# Patient Record
Sex: Female | Born: 1975
Health system: Southern US, Community
[De-identification: ages and names within clinical notes are randomized; demographics above are authoritative.]

## PROBLEM LIST (undated history)

## (undated) DIAGNOSIS — C569 Malignant neoplasm of unspecified ovary: Secondary | ICD-10-CM

## (undated) DIAGNOSIS — R1900 Intra-abdominal and pelvic swelling, mass and lump, unspecified site: Secondary | ICD-10-CM

## (undated) DIAGNOSIS — B379 Candidiasis, unspecified: Secondary | ICD-10-CM

## (undated) DIAGNOSIS — IMO0002 Reserved for concepts with insufficient information to code with codable children: Secondary | ICD-10-CM

## (undated) DIAGNOSIS — M5432 Sciatica, left side: Secondary | ICD-10-CM

## (undated) DIAGNOSIS — R1902 Left upper quadrant abdominal swelling, mass and lump: Secondary | ICD-10-CM

## (undated) DIAGNOSIS — C561 Malignant neoplasm of right ovary: Secondary | ICD-10-CM

## (undated) DIAGNOSIS — G479 Sleep disorder, unspecified: Secondary | ICD-10-CM

## (undated) DIAGNOSIS — R87619 Unspecified abnormal cytological findings in specimens from cervix uteri: Secondary | ICD-10-CM

## (undated) DIAGNOSIS — N83209 Unspecified ovarian cyst, unspecified side: Secondary | ICD-10-CM

## (undated) DIAGNOSIS — C801 Malignant (primary) neoplasm, unspecified: Secondary | ICD-10-CM

## (undated) DIAGNOSIS — D649 Anemia, unspecified: Secondary | ICD-10-CM

## (undated) DIAGNOSIS — A630 Anogenital (venereal) warts: Secondary | ICD-10-CM

## (undated) DIAGNOSIS — Z9889 Other specified postprocedural states: Secondary | ICD-10-CM

## (undated) DIAGNOSIS — L709 Acne, unspecified: Secondary | ICD-10-CM

## (undated) DIAGNOSIS — E210 Primary hyperparathyroidism: Secondary | ICD-10-CM

## (undated) DIAGNOSIS — E7800 Pure hypercholesterolemia, unspecified: Secondary | ICD-10-CM

## (undated) DIAGNOSIS — R232 Flushing: Secondary | ICD-10-CM

## (undated) DIAGNOSIS — Z8543 Personal history of malignant neoplasm of ovary: Secondary | ICD-10-CM

## (undated) DIAGNOSIS — F418 Other specified anxiety disorders: Secondary | ICD-10-CM

## (undated) DIAGNOSIS — K5900 Constipation, unspecified: Secondary | ICD-10-CM

## (undated) HISTORY — DX: Acne, unspecified: L70.9

## (undated) HISTORY — DX: Unspecified abnormal cytological findings in specimens from cervix uteri: R87.619

## (undated) HISTORY — DX: Pure hypercholesterolemia, unspecified: E78.00

## (undated) HISTORY — DX: Anogenital (venereal) warts: A63.0

## (undated) HISTORY — DX: Constipation, unspecified: K59.00

## (undated) HISTORY — DX: Personal history of malignant neoplasm of ovary: Z85.43

## (undated) HISTORY — DX: Other specified anxiety disorders: F41.8

## (undated) HISTORY — PX: BREAST BIOPSY: SHX20

## (undated) HISTORY — DX: Unspecified ovarian cyst, unspecified side: N83.209

## (undated) HISTORY — DX: Primary hyperparathyroidism: E21.0

## (undated) HISTORY — PX: LAPAROSCOPIC HYSTERECTOMY: SHX1926

## (undated) HISTORY — DX: Anemia, unspecified: D64.9

## (undated) HISTORY — DX: Flushing: R23.2

## (undated) HISTORY — PX: EYE SURGERY: SHX253

## (undated) HISTORY — PX: SMALL INTESTINE SURGERY: SHX150

## (undated) HISTORY — PX: LAPAROTOMY: SHX154

## (undated) HISTORY — PX: ABDOMINAL HYSTERECTOMY: SHX81

---

## 2009-06-29 NOTE — H&P (Unsigned)
[facility]   [address]   [place], [place] [address]     PRE-ADMISSION   HISTORY AND PHYSICAL    PATIENT: Katie Mckay, Katie Mckay  MRN: [id number] DATE: 06/30/2009  BILLING: [id number] ROOM:  DICTATING: Kenika Sahm Y. Mehran Guderian, MD,FACS      HISTORY OF PRESENT ILLNESS: This is a 33 year old, African American female  with a family history of breast cancer who presents today for excisional  biopsy of a palpable right breast mass. This mass has been present for  several months in duration. She presents today for excisional biopsy for  definitive diagnosis.    PAST MEDICAL HISTORY: None.    PAST SURGICAL HISTORY: Cesarean section, ovarian cystectomy, appendectomy,  cecectomy.    MEDICATIONS: Include prenatal vitamins.    ALLERGIES: NO KNOWN DRUG ALLERGIES.    SOCIAL HISTORY: Negative for alcohol and tobacco.    FAMILY HISTORY: Significant for breast cancer in a grandmother.    PHYSICAL EXAMINATION  GENERAL: The patient is a well-developed, well-nourished female in no  apparent distress.  HEENT EXAM: Within normal limits.  LUNGS: Clear to auscultation.  HEART: Regular rate.  ABDOMEN: Soft, nontender, nondistended. Active bowel sounds.  EXTREMITIES: Warm and nontender.  NEUROLOGICAL: There were no focal deficits.  BREAST: The right breast exam is significant for a 1.5 cm mass at the 6:30  position, 4 cm from the nipple. The left axillary exam is negative.    ASSESSMENT AND PLAN: This is a 33 year old, African American female with a  family history of breast cancer who presents today for excisional biopsy of  a palpable right breast mass. The procedure risks, benefits, alternatives  were explained to the patient who understands and wishes to proceed. All  questions were answered.                         Preliminary Unless Authenticated   Brynley Cuddeback Y. Corrina Steffensen, MD,FACS    EYC:wmx  D: 06/29/2009 T: 06/29/2009 9:45 A  Job: [id number] CScriptDoc #: [id number]    cc: Syliva Overman. Jovee Dettinger, MD,FACS

## 2009-06-30 NOTE — Op Note (Unsigned)
[facility]   [address] [place], [place] [address]     OPERATIVE REPORT    PATIENT: Katie Mckay, Katie Mckay  MRN: [id number] DATE: 06/30/2009  BILLING: [id number]  ROOM:  ATTENDING: Syliva Overman. Shoshannah Faubert, MD,FACS  DICTATING: Syliva Overman. Marshawn Normoyle, MD,FACS      PREOPERATIVE DIAGNOSIS: Right breast mass.    POSTOPERATIVE DIAGNOSIS: Right breast mass.    PROCEDURE: Right breast biopsy.    ANESTHESIA: Monitored anesthesia care.    SURGEON: Jacquelyne Balint, MD, FACS    PROCEDURE: The patient was brought into the operating room and was put in  the supine position. After adequate sedation, the patient was then prepped  and draped in usual sterile fashion. The area of the palpable mass was then  anesthetized with 1% lidocaine solution with bicarbonate. Once this was  done, a curvilinear incision was made include directly over this mass. The  subcutaneous tissue was divided with electrocautery. Superior and inferior  flaps were then elevated. The mass was then grasped with an Allis clamp and  then excised with electrocautery. After adequate irrigation and hemostasis,  the incision was closed in layers. The subcutaneous tissue was closed with  interrupted 3-0 Vicryl sutures, and the skin was then closed with a 4-0  Vicryl subcuticular stitch. Dry sterile dressings were placed. The patient  was then transported to the recovery room in stable condition.             Preliminary Unless Authenticated   Janari Yamada Y. Emmajo Bennette, MD,FACS    EYC:wmx  D: 06/30/2009 T: 06/30/2009 12:19 P  Job: [id number] CScriptDoc #: [id number]    cc: Syliva Overman. Makynzie Dobesh, MD,FACS

## 2011-07-04 MED ORDER — CLONAZEPAM 0.5 MG TAB
0.5 mg | ORAL_TABLET | Freq: Every evening | ORAL | Status: DC | PRN
Start: 2011-07-04 — End: 2011-10-30

## 2011-07-04 NOTE — Progress Notes (Signed)
Last pap 2011. Abnormal Pap smears Yes (2008).  Procedures None.Form of contraception Yes - mirena. Mammogram 2010. Family history of breast CA Yes (pgm 60's), colon CA No, cervical CA No.Tetanus Yes - not over 10 yrs.  Patient is scheduled for pap with Dr. Lanny Hurst.

## 2011-07-04 NOTE — Patient Instructions (Signed)
Learning About Stress  What is stress?  Stress is what you feel when you have to handle more than you are used to. Stress is a fact of life for most people, and it affects everyone differently. What causes stress for you may not be stressful for someone else.  A lot of things can cause stress. You may feel stress when you go on a job interview, take a test, or run a race. This kind of short-term stress is normal and even useful. It can help you if you need to work hard or react quickly. For example, stress can help you finish an important job on time.  Stress also can last a long time. Long-term stress is caused by stressful situations or events. Examples of long-term stress include long-term health problems, ongoing problems at work, or conflicts in your family. Long-term stress can harm your health.  How does stress affect your health?  When you are stressed, your body responds as though you are in danger. It makes hormones that speed up your heart, make you breathe faster, and give you a burst of energy. This is called the fight-or-flight stress response. If the stress is over quickly, your body goes back to normal and no harm is done.  But if stress happens too often or lasts too long, it can have bad effects. Long-term stress can make you more likely to get sick, and it can make symptoms of some diseases worse. If you tense up when you are stressed, you may develop neck, shoulder, or low back pain. Stress is linked to high blood pressure and heart disease.  Stress also harms your emotional health. It can make you moody, tense, or depressed. Your relationships may suffer, and you may not do well at work or school.  What can you do to manage stress?  How to relax your mind  ?? Write. It may help to write about things that are bothering you. This helps you find out how much stress you feel and what is causing it. When you know this, you can find better ways to cope.    ?? Let your feelings out. Talk, laugh, cry, and express anger when you need to. Talking with friends, family, a Veterinary surgeon, or a member of the clergy about your feelings is a healthy way to relieve stress.   ?? Do something you enjoy. For example, listen to music or go to a movie. Practice your hobby or do volunteer work.   ?? Meditate. This can help you relax, because you are not worrying about what happened before or what may happen in the future.   ?? Do guided imagery. Imagine yourself in any setting that helps you feel calm. You can use audiotapes, books, or a teacher to guide you.   How to relax your body  ?? Do something active. Exercise or activity can help reduce stress. Walking is a great way to get started. Even everyday activities such as housecleaning or yard work can help.   ?? Do breathing exercises. For example:   ?? From a standing position, bend forward from the waist with your knees slightly bent. Let your arms dangle close to the floor.   ?? Breathe in slowly and deeply as you return to a standing position. Roll up slowly and lift your head last.   ?? Hold your breath for just a few seconds in the standing position.   ?? Breathe out slowly and bend forward from the waist.   ??  Try yoga or tai chi. These techniques combine exercise and meditation. You may need some training at first to learn them.   What can you do to prevent stress?  ?? Manage your time. This helps you find time to do the things you want and need to do.   ?? Get enough sleep. Your body recovers from the stresses of the day while you are sleeping.   ?? Get support. Your family, friends, and community can make a difference in how you experience stress.     Where can you learn more?    Go to [employer]   Enter ([phone] in the search box to learn more about "Learning About Stress."     ?? 2006-2012 [employer]. Care instructions adapted under license by [employer] (which disclaims liability or warranty for this information). This care instruction is for use with your licensed healthcare professional. If you have questions about a medical condition or this instruction, always ask your healthcare professional. [employer]wise, Incorporated disclaims any warranty or liability for your use of this information.  Content Version: 9.4.94723; Last Revised: July 08, 2010          Work-Life Balance: After Your Visit  Your Care Instructions  Do you ever feel like there is not enough time to do all of the things you have to do, and no time at all for the things you enjoy? If so, you are not alone.  On average, people in the [place] have worked more and more hours each year since 1970. But in recent years, fewer people say they want to take on more at work, even if they would get promoted or get paid more money. More and more workers say they want time to spend with their families and to do things that are important to them.  Do you ever feel:  ?? That you always have more and more work to do at your job?   ?? That too many people depend on you every day?   ?? That you never have enough time for your family or friends?   ?? That you never have time for hobbies or things you enjoy?   ?? That each second of your day is scheduled?   If you answered "yes" to any of these questions, take steps at work and at home to get your life into balance.  Follow-up care is a key part of your treatment and safety. Be sure to make and go to all appointments, and call your doctor if you are having problems.  How can you care for yourself at home?  Manage your time  ?? Focus on the important things. Taking on too much can wear you out. Look at how you spend your time, and redirect your focus. Learn to say "no" and let go of things that do not matter.    ?? Set one small goal at a time. Use a day planner. Break large projects into smaller ones.   ?? Ask for help. Let your children, your spouse, your coworkers, and other people in your life help you get things done.   ?? Leave your job at the office. If you give up free time to get more work done, you may pay for it with stress. If your job offers a flexible work schedule, use it to fit your own work style. For instance, come in earlier to have a longer lunch break, or make time for a yoga class or workout during your workday.   ??  Unplug. Do not let technology (such as your cell phone or the Internet) erase the line between your time and your employer's time.   Lower job stress  Job stress causes trouble at work and at home. At work, you may worry about things you have not had time to do at home. At home, you may worry about your job. This cycle upsets your work-life balance. Lowering your job stress can get your life back in balance.  Job stress can be caused by:  ?? Pressure and deadlines.   ?? Heavy workloads or long hours.   ?? Not being allowed to make decisions.   ?? Health and safety hazards.   ?? Feeling you may lose your job.   ?? Unclear or changing job duties.   ?? Too much responsibility.   ?? Work that is very tiring or boring.   Do any of these things bother you? Consider talking with your boss to change things. There are some things that you may not be able to control. But even a few small changes might help lower your stress.  Take advantage of programs at work  Businesses make money and are better off in other ways if their employees are healthy and happy. For this reason, many companies have programs to help balance work life and home life.  These programs may include:  ?? Flexible schedules and hours.   ?? Time off for family reasons, education, or community service.   ?? Being able to work from home.   ?? Employee assistance programs to provide counseling.   ?? Child-care programs.    Check to see if your company has any of these or other programs that could help you. If not, consider talking to your boss about why work-life balance programs make good business sense. Even if your company does not start an official program, you may be able to get flexible hours, time off, or the ability to do some work from home.  Know when to quit  If you are truly unhappy because of a stressful job, and if the suggestions here have not worked, it may be time to think about changing jobs or Hotel manager. But before you quit, take time to research your options.    Where can you learn more?    Go to [employer]   Enter [phone] in the search box to learn more about "Work-Life Balance: After Your Visit."    ?? 2006-2012 Healthwise, Inc[employer]. Care instructions adapted under license by [employer] (which disclaims liability or warranty for this information). This care instruction is for use with your licensed healthcare professional. If you have questions about a medical condition or this instruction, always ask your healthcare professional. Healthwise, Incorporated disclaims any warranty or liability for your use of this information.  Content Version: 9.4.94723; Last Revised: September 29, 2010

## 2011-07-05 NOTE — Progress Notes (Signed)
BREINDEL COLLIER  [birth year]    HPI    The patient is a 35 y.o. y/o female who presents to establish care.  She notes a lot of stress.  Has some mild anxiety.  Not sleeping through the night recently.  She notes her R upper eyelid is twitching often- thinks it must be due to stress.  Prefers to try something to use very occasionally for anxiety/insomnia.  Had blood work done in May- TC 205, HDL 89, Glu 86.      Current outpatient prescriptions:PRENATAL VITS W-CA,FE,FA,1MG , (PRENATAL VITAMIN PO), Take  by mouth. With DHEA , Disp: , Rfl: ;  cholecalciferol, vitamin D3, (VITAMIN D3) 2,000 unit Tab, Take  by mouth.  , Disp: , Rfl: ;  levonorgestrel (MIRENA) 20 mcg/24 hr IUD, 1 Each by IntraUTERine route once.  , Disp: , Rfl: ;  clonazePAM (KLONOPIN) 0.5 mg tablet, Take 1 Tab by mouth nightly as needed., Disp: 20 Tab, Rfl: 0    Allergies   Allergen Reactions   ??? Stadol (Butorphanol Tartrate) Unknown (comments)        Past Medical History   Diagnosis Date   ??? Hypercholesterolemia 2011       Past Surgical History   Procedure Date   ??? Hx appendectomy 2009       History     Social History   ??? Marital Status: Single     Spouse Name: N/A     Number of Children: N/A   ??? Years of Education: N/A     Occupational History   ??? Not on file.     Social History Main Topics   ??? Smoking status: Never Smoker    ??? Smokeless tobacco: Never Used   ??? Alcohol Use: Yes      occasionally   ??? Drug Use: No   ??? Sexually Active: Yes -- Female partner(s)     Birth Control/ Protection: IUD     Other Topics Concern   ??? Not on file     Social History Narrative   ??? No narrative on file       Family History   Problem Relation Age of Onset   ??? Drug Abuse Mother    ??? Hypertension Mother    ??? Diabetes Mother    ??? Drug Abuse Father    ??? Cancer Father      Prostate   ??? Asthma Father    ??? Hypertension Father    ??? High Cholesterol Father    ??? Asthma Brother        ROS: A comprehensive review of systems was negative except for that written in the HPI.     Physical Exam    Filed Vitals:    07/04/11 1547   BP: 104/80   Pulse: 87   Temp: 98 ??F (36.7 ??C)   TempSrc: Oral   Resp: 18   Height: 5' 2.5" (1.588 m)   Weight: 149 lb 12.8 oz (67.949 kg)   SpO2: 100%       General: NAD, well appearing  Head: atraumatic, no deformity noted  Eyes: normal  Neck: supple, no carotid bruits appreciated  CVS:  normal rate, regular rhythm, normal S1, S2, no murmurs, rubs, clicks or gallops.  Lungs: CTAB, no rales/wheezes/rhonchi noted  Ext: no edema/cyanosis noted  Skin: warm and dry  Psych: normal affect noted  Neuro: speech and language normal    No results found for this or any previous visit.  ASSESSMENT and PLAN  [place] was seen today for new patient and complete physical.    Diagnoses and associated orders for this visit:    Hyperlipidemia  - LIPID PANEL    Fasciculations  - METABOLIC PANEL, COMPREHENSIVE  - T4, FREE  - TSH, 3RD GENERATION    Sleep disturbance  - clonazePAM (KLONOPIN) 0.5 mg tablet; Take 1 Tab by mouth nightly as needed.    Stress  - clonazePAM (KLONOPIN) 0.5 mg tablet; Take 1 Tab by mouth nightly as needed.    Family history of diabetes mellitus  - HEMOGLOBIN A1C    Other Orders  - PRENATAL VITS W-CA,FE,FA,1MG , (PRENATAL VITAMIN PO); Take  by mouth. With DHEA   - cholecalciferol, vitamin D3, (VITAMIN D3) 2,000 unit Tab; Take  by mouth.    - levonorgestrel (MIRENA) 20 mcg/24 hr IUD; 1 Each by IntraUTERine route once.          Follow-up Disposition:  Return in about 3 months (around 10/04/2011), or if symptoms worsen or fail to improve.      Plan of care reviewed.  Patient has provided input and agrees with goals.

## 2011-07-13 NOTE — Progress Notes (Signed)
Your patient visited the [facility] interactive preventive healthcare   record on 07/13/2011.    Below is a summary of your patient's information. Thank you for encouraging   your patients to  use [facility]!     INFORMATION YOUR PATIENT UPDATED:  Your patient did not update any information.    CURRENT HEALTH BEHAVIORS:  Your patient eats 3 serving(s) of fruits and vegetables per day.  Your patient exercises 1 time(s) per week.  Your patient does not smoke.  Your patient's body mass index (BMI) is 27.    CARE YOUR PATIENT MAY NEED:  Tetanus vaccine: No record of a tetanus vaccine

## 2011-07-14 LAB — TSH 3RD GENERATION: TSH: 1.31 u[IU]/mL (ref 0.450–4.500)

## 2011-07-14 LAB — METABOLIC PANEL, COMPREHENSIVE
A-G Ratio: 1.4 (ref 1.1–2.5)
ALT (SGPT): 22 IU/L (ref 0–32)
AST (SGOT): 31 IU/L (ref 0–40)
Albumin: 4.5 g/dL (ref 3.5–5.5)
Alk. phosphatase: 98 IU/L (ref 25–150)
BUN/Creatinine ratio: 25 — ABNORMAL HIGH (ref 8–20)
BUN: 20 mg/dL (ref 6–20)
Bilirubin, total: 0.3 mg/dL (ref 0.0–1.2)
CO2: 20 mmol/L (ref 20–32)
Calcium: 10.4 mg/dL — ABNORMAL HIGH (ref 8.7–10.2)
Chloride: 103 mmol/L (ref 97–108)
Creatinine: 0.81 mg/dL (ref 0.57–1.00)
GFR est non-AA: 94 mL/min/{1.73_m2} (ref >59)
GLOBULIN, TOTAL: 3.3 g/dL (ref 1.5–4.5)
Glucose: 47 mg/dL — ABNORMAL LOW (ref 65–99)
Potassium: 4.3 mmol/L (ref 3.5–5.2)
Protein, total: 7.8 g/dL (ref 6.0–8.5)
Sodium: 140 mmol/L (ref 134–144)
eGFR If African American: 108 mL/min/{1.73_m2} (ref >59)

## 2011-07-14 LAB — T4, FREE: T4, Free: 1.28 ng/dL (ref 0.82–1.77)

## 2011-07-14 LAB — LIPID PANEL
Cholesterol, total: 202 mg/dL — ABNORMAL HIGH (ref 100–199)
HDL Cholesterol: 81 mg/dL (ref >39)
LDL, calculated: 112 mg/dL — ABNORMAL HIGH (ref 0–99)
Triglyceride: 44 mg/dL (ref 0–149)
VLDL, calculated: 9 mg/dL (ref 5–40)

## 2011-07-14 LAB — HEMOGLOBIN A1C WITH EAG: Hemoglobin A1c: 5.3 % (ref 4.8–5.6)

## 2011-07-30 ENCOUNTER — Encounter

## 2011-07-30 NOTE — Progress Notes (Signed)
Quick Note:    Glucose low- recommend eating regularly/monitoring this. Calcium mildly elevated- need to check a few more tests. Will print lab slip.  ______

## 2011-08-04 NOTE — Progress Notes (Signed)
Quick Note:    Spoke with patient and she verbalized understanding.  ______

## 2011-08-18 LAB — AMB POC URINALYSIS DIP STICK AUTO W/O MICRO
Bilirubin (UA POC): NEGATIVE
Blood (UA POC): NEGATIVE
Glucose (UA POC): NEGATIVE
Ketones (UA POC): NEGATIVE
Nitrites (UA POC): NEGATIVE
Protein (UA POC): NEGATIVE mg/dL
Specific gravity (UA POC): 1.02 (ref 1.001–1.035)
Urobilinogen (UA POC): 0.2 (ref 0.2–1)
pH (UA POC): 6.5 (ref 4.6–8.0)

## 2011-08-18 LAB — AMB POC URINE PREGNANCY TEST, VISUAL COLOR COMPARISON: HCG urine, Ql. (POC): NEGATIVE

## 2011-08-18 MED ORDER — CIPROFLOXACIN 250 MG TAB
250 mg | ORAL_TABLET | Freq: Two times a day (BID) | ORAL | Status: AC
Start: 2011-08-18 — End: 2011-08-21

## 2011-08-18 NOTE — Progress Notes (Signed)
Katie Mckay, 36 y.o.,  female    SUBJECTIVE  Lower abdominal pain    Patient has had vomiting and diarrhea 1 week ago, that has resolved after 3 days.  However lower abdominal pain described as aching persists.  She is now complaining of urinary urgency and hesitancy. No dysuria, flank pain or fever.  She has an IUD in place, in a monogamous relationship with husband,  wants to be checked for STD.        ROS:  General ROS: no fever, weight loss, fatigue  ENT ROS: no congestion, sinus pressure, nasal discharge, sore throat  Respiratory ROS: no cough, shortness of breath, or wheezing  Cardiovascular ROS: no chest pain or dyspnea on exertion          There is no problem list on file for this patient.      Current Outpatient Prescriptions   Medication Sig Dispense Refill   ??? Cholecalciferol, Vitamin D3, (VITAMIN D3) 1,000 unit cap Take 2 Units by mouth daily.         ??? ciprofloxacin (CIPRO) 250 mg tablet Take 1 Tab by mouth every twelve (12) hours for 3 days.  6 Tab  0   ??? PRENATAL VITS W-CA,FE,FA,1MG , (PRENATAL VITAMIN PO) Take 1 Tab by mouth daily. With DHEA       ??? levonorgestrel (MIRENA) 20 mcg/24 hr IUD 1 Each by IntraUTERine route once.         ??? clonazePAM (KLONOPIN) 0.5 mg tablet Take 1 Tab by mouth nightly as needed.  20 Tab  0       Allergies   Allergen Reactions   ??? Stadol (Butorphanol Tartrate) Unknown (comments)       Past Medical History   Diagnosis Date   ??? Hypercholesterolemia 2011           OBJECTIVE    Physical Exam:     BP 114/76   Pulse 81   Temp(Src) 98.4 ??F (36.9 ??C) (Oral)   Resp 14   Ht 5' 2.5" (1.588 m)   Wt 150 lb (68.04 kg)   BMI 27.00 kg/m2   SpO2 98%    General: alert, well-appearing, in no apparent distress or pain  CVS: normal rate, regular rhythm, distinct S1 and S2, no murmurs, rubs clicks or gallops  Lungs: Breathing not labored, good chest excursion, clear to ausculation bilaterally, no crackles, wheezing or rhonchi noted  Abdomen: normoactive bowel sounds, soft, + suprapubic  tenderness, no organomegaly  Pelvic exam: normal external genitalia, vulva, vagina, cervix, uterus and adnexa no CMT IUD string palpated.  Psych: alert and oriented x3, mood, insight, speech and affect normal    Results for orders placed in visit on 08/18/11   AMB POC URINE PREGNANCY TEST, VISUAL COLOR COMPARISON       Component Value Range    HCG urine, Ql. (POC) Negative  Negative   AMB POC URINALYSIS DIP STICK AUTO W/O MICRO       Component Value Range    Color Yellow  (none)    Clarity Clear  (none)    Glucose Negative  (none)    Bilirubin Negative  (none)    Ketones Negative  (none)    Spec.Grav. 1.020  1.001 - 1.035    Blood Negative  (none)    pH 6.5  4.6 - 8.0    Protein Negative  Negative mg/dL    Urobilinogen 0.2 mg/dL  0.2 - 1    Nitrites Negative  (none)  Leukocyte esterase Trace  (none)         ASSESSMENT/PLAN  Katie Mckay was seen today for nausea and abdominal pain.    Diagnoses and associated orders for this visit:    Acute cystitis  - ciprofloxacin (CIPRO) 250 mg tablet; Take 1 Tab by mouth every twelve (12) hours for 3 days.    Pelvic pressure in female  - CULTURE, URINE  - AMB POC URINE PREGNANCY TEST, VISUAL COLOR COMPARISON  - AMB POC URINALYSIS DIP STICK AUTO W/O MICRO  - CHLAMYDIA/GC AMPLIFIED    Other Orders  - Cholecalciferol, Vitamin D3, (VITAMIN D3) 1,000 unit cap; Take 2 Units by mouth daily.            Follow-up Disposition:if no improvement or worsening symptoms

## 2011-08-18 NOTE — Patient Instructions (Signed)
Urinary Tract Infection in Women: After Your Visit  Your Care Instructions  A urinary tract infection, or UTI, is a general term for an infection anywhere between the kidneys and the urethra (where urine comes out). Most UTIs are bladder infections. They often cause pain or burning when you urinate.  UTIs are caused by bacteria and can be cured with antibiotics. Be sure to complete your treatment so that the infection goes away.  Follow-up care is a key part of your treatment and safety. Be sure to make and go to all appointments, and call your doctor if you are having problems. It???s also a good idea to know your test results and keep a list of the medicines you take.  How can you care for yourself at home?  ?? Take your antibiotics as directed. Do not stop taking them just because you feel better. You need to take the full course of antibiotics.  ?? Drink extra water and other fluids for the next day or two. This may help wash out the bacteria that are causing the infection. (If you have kidney, heart, or liver disease and have to limit fluids, talk with your doctor before you increase your fluid intake.)  ?? Avoid drinks that are carbonated or have caffeine. They can irritate the bladder.  ?? Urinate often. Try to empty your bladder each time.  ?? To relieve pain, take a hot bath or lay a heating pad set on low over your lower belly or genital area. Never go to sleep with a heating pad in place.  To prevent UTIs  ?? Drink plenty of water each day. This helps you urinate often, which clears bacteria from your system. (If you have kidney, heart, or liver disease and have to limit fluids, talk with your doctor before you increase your fluid intake.)  ?? Consider adding cranberry juice to your diet.  ?? Urinate when you need to.  ?? Urinate right after you have sex.  ?? Change sanitary pads often.  ?? Avoid douches, bubble baths, feminine hygiene sprays, and other feminine hygiene products that have deodorants.  ?? After going  to the bathroom, wipe from front to back.  When should you call for help?  Call your doctor now or seek immediate medical care if:  ?? Symptoms such as fever, chills, nausea, or vomiting get worse or appear for the first time.  ?? You have new pain in your back just below your rib cage. This is called flank pain.  ?? There is new blood or pus in your urine.  ?? You have any problems with your antibiotic medicine.  Watch closely for changes in your health, and be sure to contact your doctor if:  ?? You are not getting better after 1 day (24 hours).  ?? Your symptoms go away but then come back.   Where can you learn more?   Go to http://www.healthwise.net/[facility]  Enter K848 in the search box to learn more about "Urinary Tract Infection in Women: After Your Visit."   ?? 2006-2012 [employer], Inc[employer]. Care instructions adapted under license by [place] (which disclaims liability or warranty for this information). This care instruction is for use with your licensed healthcare professional. If you have questions about a medical condition or this instruction, always ask your healthcare professional. Healthwise, Incorporated disclaims any warranty or liability for your use of this information.  Content Version: 9.5.76532; Last Revised: Dec 21, 2009

## 2011-08-20 LAB — CULTURE, URINE
RESULT 1, 997131: NO GROWTH
Result 1: NO GROWTH

## 2011-08-21 LAB — CHLAMYDIA/GC PCR
Chlamydia trachomatis, NAA: NEGATIVE
Neisseria gonorrhoeae, NAA: NEGATIVE

## 2011-08-21 NOTE — Progress Notes (Signed)
Quick Note:    Patient identifier verified (last 4 SSN). Patient advised that testing was normal.  ______

## 2011-08-21 NOTE — Progress Notes (Signed)
Quick Note:    pls mail letter  ______

## 2011-09-15 LAB — METABOLIC PANEL, COMPREHENSIVE
A-G Ratio: 1.3 (ref 1.1–2.5)
ALT (SGPT): 24 IU/L (ref 0–32)
AST (SGOT): 24 IU/L (ref 0–40)
Albumin: 4.1 g/dL (ref 3.5–5.5)
Alk. phosphatase: 91 IU/L (ref 25–150)
BUN/Creatinine ratio: 19 (ref 8–20)
BUN: 15 mg/dL (ref 6–20)
Bilirubin, total: 0.2 mg/dL (ref 0.0–1.2)
CO2: 24 mmol/L (ref 20–32)
Calcium: 10.1 mg/dL (ref 8.7–10.2)
Chloride: 105 mmol/L (ref 97–108)
Creatinine: 0.78 mg/dL (ref 0.57–1.00)
GFR est non-AA: 97 mL/min/{1.73_m2} (ref >59)
GLOBULIN, TOTAL: 3.1 g/dL (ref 1.5–4.5)
Glucose: 77 mg/dL (ref 65–99)
Potassium: 4.3 mmol/L (ref 3.5–5.2)
Protein, total: 7.2 g/dL (ref 6.0–8.5)
Sodium: 141 mmol/L (ref 134–144)
eGFR If African American: 112 mL/min/{1.73_m2} (ref >59)

## 2011-09-15 LAB — CALCIUM, IONIZED: Calcium, ionized: 5.3 mg/dL (ref 4.5–5.6)

## 2011-09-15 LAB — PTH INTACT: PTH, Intact: 18 pg/mL (ref 15–65)

## 2011-09-16 MED ORDER — BLOOD SUGAR DIAGNOSTIC TEST STRIPS
ORAL_STRIP | Status: AC
Start: 2011-09-16 — End: 2011-09-17

## 2011-09-16 MED ORDER — BLOOD GLUCOSE METER KIT
PACK | Status: AC
Start: 2011-09-16 — End: ?

## 2011-09-16 MED ORDER — LANCETS
PACK | Status: AC
Start: 2011-09-16 — End: ?

## 2011-09-16 NOTE — Patient Instructions (Signed)
Hypoglycemia: After Your Visit  Your Care Instructions  Hypoglycemia means that your blood sugar is low and your body is not getting enough fuel. Some people get low blood sugar from not eating often enough. Some medicines to treat diabetes can cause low blood sugar. People who have had surgery on their stomachs or intestines may get hypoglycemia. Problems with the pancreas, kidneys, or liver also can cause low blood sugar.  A snack or drink with sugar in it will raise your blood sugar and should ease your symptoms right away.  Your doctor may recommend that you change or stop your medicines until you can get your blood sugar levels under control. In the long run, you may need to change your diet and eating habits so that you get enough fuel for your body throughout the day.  Follow-up care is a key part of your treatment and safety. Be sure to make and go to all appointments, and call your doctor if you are having problems. It???s also a good idea to know your test results and keep a list of the medicines you take.  How can you care for yourself at home?  ?? Learn to recognize the early signs of low blood sugar. Signs include:   ?? Nausea.   ?? Hunger.   ?? Feeling nervous, irritable, or shaky.   ?? Cold, clammy, wet skin.   ?? Sweating (when you are not exercising).   ?? A fast heartbeat.   ?? Numbness or tingling of the fingertips or lips.   ?? If you feel an episode of low blood sugar coming on, drink fruit juice or sugared (not diet) soda, or eat sugar in the form of candy, cubes, or tablets. Raisins are another quick-sugar food.   ?? Eat small, frequent meals so that you do not get too hungry between meals.   ?? Balance extra exercise with eating more.   ?? Keep a written record of your low blood sugar episodes, including when you last ate and what you ate, so that you can learn what causes your blood sugar to drop.   ?? Make sure your family, friends, and coworkers know the symptoms of low blood sugar and know what to do  to get your sugar level up.   ?? Wear medical alert jewelry that lists your condition. You can buy this at most drugstores.   When should you call for help?  Call 911 anytime you think you may need emergency care. For example, call if:  ?? You have a seizure.   ?? You passed out (lost consciousness).   ?? You have symptoms of low blood sugar that do not get better after you eat or drink something with sugar in it.   ?? You feel confused or have trouble thinking.   Call your doctor now or seek immediate medical care if:  ?? Your vision gets blurry, you feel dizzy, or you get a headache.   ?? You feel weak or drowsy.   ?? You have trouble standing, walking, or talking.   Watch closely for changes in your health, and be sure to contact your doctor if:  ?? Your symptoms continue or return.     Where can you learn more?    Go to http://www.healthwise.net/[facility]   Enter R955 in the search box to learn more about "Hypoglycemia: After Your Visit."    ?? 2006-2012 Healthwise, Incorporated. Care instructions adapted under license by [place] (which disclaims liability or warranty for this   information). This care instruction is for use with your licensed healthcare professional. If you have questions about a medical condition or this instruction, always ask your healthcare professional. Healthwise, Incorporated disclaims any warranty or liability for your use of this information.  Content Version: 9.5.76532; Last Revised: October 22, 2009

## 2011-09-16 NOTE — Progress Notes (Signed)
Katie Mckay  12/06/1975    HPI    The patient is a 36 y.o. y/o female who presents for follow up.  Stress/anxiety has improved.  Daughter 14 mos sleeping better now so patient is sleeping better.  Not using klonopin often- only rarely when she is too stressed to sleep.  Does feel shaky/nauseated when she doesn't eat regularly.  Happens multiples times a week.  Denies any cp/sob.  Feels well in general.    Current outpatient prescriptions:glucose blood VI test strips (BLOOD GLUCOSE TEST) strip, Use as directed qd-bid prn, Disp: 100 Strip, Rfl: 1;  Lancets Misc, Use as directed qd-bid prn, Disp: 1 Package, Rfl: 1;  Blood-Glucose Meter monitoring kit, Use as directed., Disp: 1 Kit, Rfl: 0;  Cholecalciferol, Vitamin D3, (VITAMIN D3) 1,000 unit cap, Take 2 Units by mouth daily.  , Disp: , Rfl:   PRENATAL VITS W-CA,FE,FA,1MG , (PRENATAL VITAMIN PO), Take 1 Tab by mouth daily. With DHEA, Disp: , Rfl: ;  levonorgestrel (MIRENA) 20 mcg/24 hr IUD, 1 Each by IntraUTERine route once.  , Disp: , Rfl: ;  clonazePAM (KLONOPIN) 0.5 mg tablet, Take 1 Tab by mouth nightly as needed., Disp: 20 Tab, Rfl: 0    Allergies   Allergen Reactions   ??? Stadol (Butorphanol Tartrate) Unknown (comments)        Past Medical History   Diagnosis Date   ??? Hypercholesterolemia 2011       Past Surgical History   Procedure Date   ??? Hx appendectomy 2009   ??? Hx cesarean section 2007; 2011       History     Social History   ??? Marital Status: SINGLE     Spouse Name: N/A     Number of Children: N/A   ??? 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 Smoking status: Never Smoker    ??? Smokeless tobacco: Never Used   ??? Alcohol Use: Yes      occasionally   ??? Drug Use: No   ??? Sexually Active: Yes -- Female partner(s)     Birth Control/ Protection: IUD     Other Topics Concern   ??? Not on file     Social History Narrative   ??? No narrative on file       Family History   Problem Relation Age of Onset   ??? Drug  Abuse Mother    ??? Hypertension Mother    ??? Diabetes Mother    ??? Drug Abuse Father    ??? Cancer Father      Prostate   ??? Asthma Father    ??? Hypertension Father    ??? High Cholesterol Father    ??? Asthma Brother    ??? Heart Attack Paternal Grandfather        ROS: A comprehensive review of systems was negative except for that written in the HPI.    Physical Exam    Filed Vitals:    09/16/11 0950   BP: 98/64   Pulse: 90   Temp: 98.1 ??F (36.7 ??C)   TempSrc: Oral   Resp: 16   Height: 5' 2.5" (1.588 m)   Weight: 150 lb (68.04 kg)   SpO2: 98%       General: NAD, well appearing  Head: atraumatic, no deformity noted  Eyes: normal  Neck: supple, no carotid bruits appreciated  CVS:  normal rate,  regular rhythm, normal S1, S2, no murmurs, rubs, clicks or gallops.  Lungs: CTAB, no rales/wheezes/rhonchi noted  Ext: no edema/cyanosis noted  Skin: warm and dry  Psych: normal affect noted  Neuro: speech and language normal    Results for orders placed in visit on 09/14/11   METABOLIC PANEL, COMPREHENSIVE       Component Value Range    Glucose 77  65 - 99 mg/dL    BUN 15  6 - 20 mg/dL    Creatinine 1.61  0.96 - 1.00 mg/dL    GFR est non-AA 97  >04 mL/min/1.73    eGFR If Africn Am 112  >59 mL/min/1.73    BUN/Creatinine ratio 19  8 - 20    Sodium 141  134 - 144 mmol/L    Potassium 4.3  3.5 - 5.2 mmol/L    Chloride 105  97 - 108 mmol/L    CO2 24  20 - 32 mmol/L    Calcium 10.1  8.7 - 10.2 mg/dL    Protein, total 7.2  6.0 - 8.5 g/dL    Albumin 4.1  3.5 - 5.5 g/dL    GLOBULIN, TOTAL 3.1  1.5 - 4.5 g/dL    A-G Ratio 1.3  1.1 - 2.5    Bilirubin, total 0.2  0.0 - 1.2 mg/dL    Alk. phosphatase 91  25 - 150 IU/L    AST 24  0 - 40 IU/L    ALT 24  0 - 32 IU/L   CALCIUM, IONIZED       Component Value Range    Calcium, ionized 5.3  4.5 - 5.6 mg/dL   PTH INTACT       Component Value Range    PTH, Intact 18  15 - 65 pg/mL          ASSESSMENT and PLAN  Katie Mckay was seen today for cholesterol problem, sleep problem and stress.    Diagnoses and associated  orders for this visit:    Hypoglycemia  - glucose blood VI test strips (BLOOD GLUCOSE TEST) strip; Use as directed qd-bid prn  - Lancets Misc; Use as directed qd-bid prn  - Blood-Glucose Meter monitoring kit; Use as directed.    Stress      given that fasting glucose was in the 40's and pt has some hypoglycemic sx will have her check her bp at home prn symptoms and bring in numbers for review  Advised to eat regularly and given handout on hypoglycemia    Follow-up Disposition:  Return in about 3 months (around 12/14/2011), or if symptoms worsen or fail to improve.      Plan of care reviewed.  Patient has provided input and agrees with goals.

## 2011-09-22 NOTE — Progress Notes (Signed)
Quick Note:    Letter sent  ______

## 2011-09-22 NOTE — Progress Notes (Signed)
Quick Note:    Please let pt know all lab work was within normal limits. Please follow up if they have any further issues.  ______

## 2011-10-11 LAB — URINALYSIS W/ RFLX MICROSCOPIC
Bilirubin: NEGATIVE
Blood: NEGATIVE
Glucose: NEGATIVE MG/DL
Ketone: NEGATIVE MG/DL
Leukocyte Esterase: NEGATIVE
Nitrites: NEGATIVE
Protein: NEGATIVE MG/DL
Specific gravity: 1.015 (ref 1.003–1.030)
Urobilinogen: 0.2 EU/DL (ref 0.2–1.0)
pH (UA): 7 (ref 5.0–8.0)

## 2011-10-11 LAB — CBC WITH AUTOMATED DIFF
ABS. BASOPHILS: 0 10*3/uL (ref 0.0–0.06)
ABS. EOSINOPHILS: 0.5 10*3/uL — ABNORMAL HIGH (ref 0.0–0.4)
ABS. LYMPHOCYTES: 3 10*3/uL (ref 0.9–3.6)
ABS. MONOCYTES: 0.4 10*3/uL (ref 0.05–1.2)
ABS. NEUTROPHILS: 3 10*3/uL (ref 1.8–8.0)
BASOPHILS: 1 % (ref 0–2)
EOSINOPHILS: 7 % — ABNORMAL HIGH (ref 0–5)
HCT: 48.4 % — ABNORMAL HIGH (ref 35.0–45.0)
HGB: 15.8 g/dL (ref 12.0–16.0)
LYMPHOCYTES: 43 % (ref 21–52)
MCH: 28.2 PG (ref 24.0–34.0)
MCHC: 32.6 g/dL (ref 31.0–37.0)
MCV: 86.4 FL (ref 74.0–97.0)
MONOCYTES: 6 % (ref 3–10)
MPV: 9.8 FL (ref 9.2–11.8)
NEUTROPHILS: 43 % (ref 40–73)
PLATELET: 333 10*3/uL (ref 135–420)
RBC: 5.6 M/uL — ABNORMAL HIGH (ref 4.20–5.30)
RDW: 13.1 % (ref 11.6–14.5)
WBC: 6.9 10*3/uL (ref 4.6–13.2)

## 2011-10-11 LAB — METABOLIC PANEL, COMPREHENSIVE
A-G Ratio: 0.9 (ref 0.8–1.7)
ALT (SGPT): 32 U/L (ref 12.0–78.0)
AST (SGOT): 29 U/L (ref 15–37)
Albumin: 4.1 g/dL (ref 3.4–5.0)
Alk. phosphatase: 101 U/L (ref 50–136)
Anion gap: 3 mmol/L (ref 3.0–18)
BUN/Creatinine ratio: 17 (ref 12–20)
BUN: 15 MG/DL (ref 7.0–18)
Bilirubin, total: 0.3 MG/DL (ref 0.2–1.0)
CO2: 31 MMOL/L (ref 21–32)
Calcium: 10.3 MG/DL — ABNORMAL HIGH (ref 8.5–10.1)
Chloride: 103 MMOL/L (ref 100–108)
Creatinine: 0.9 MG/DL (ref 0.6–1.3)
GFR est AA: >60 mL/min/{1.73_m2} (ref >60)
GFR est non-AA: >60 mL/min/{1.73_m2} (ref >60)
Globulin: 4.6 g/dL — ABNORMAL HIGH (ref 2.0–4.0)
Glucose: 85 MG/DL (ref 74–99)
Potassium: 5.1 MMOL/L (ref 3.5–5.5)
Protein, total: 8.7 g/dL — ABNORMAL HIGH (ref 6.4–8.2)
Sodium: 137 MMOL/L (ref 136–145)

## 2011-10-11 LAB — LIPASE: Lipase: 210 U/L (ref 73–393)

## 2011-10-11 LAB — HCG URINE, QL: HCG urine, QL: NEGATIVE

## 2011-10-11 LAB — WET PREP

## 2011-10-11 MED ORDER — MORPHINE 4 MG/ML SYRINGE
4 mg/mL | INTRAMUSCULAR | Status: AC
Start: 2011-10-11 — End: 2011-10-11
  Administered 2011-10-11: 20:00:00 via INTRAVENOUS

## 2011-10-11 MED ORDER — OXYCODONE-ACETAMINOPHEN 5 MG-325 MG TAB
5-325 mg | ORAL_TABLET | ORAL | Status: DC | PRN
Start: 2011-10-11 — End: 2011-10-20

## 2011-10-11 MED ORDER — DIATRIZOATE MEGLUMINE & SODIUM 66 %-10 % ORAL SOLN
66-10 % | Freq: Once | ORAL | Status: AC
Start: 2011-10-11 — End: 2011-10-11
  Administered 2011-10-11: 18:00:00 via ORAL

## 2011-10-11 MED ORDER — ONDANSETRON HCL 4 MG TAB
4 mg | ORAL_TABLET | Freq: Three times a day (TID) | ORAL | Status: DC | PRN
Start: 2011-10-11 — End: 2012-01-10

## 2011-10-11 MED ORDER — IOVERSOL 320 MG/ML IV SOLN
320 mg iodine/mL | Freq: Once | INTRAVENOUS | Status: AC
Start: 2011-10-11 — End: 2011-10-11
  Administered 2011-10-11: 19:00:00 via INTRAVENOUS

## 2011-10-11 MED ORDER — KETOROLAC TROMETHAMINE 30 MG/ML INJECTION
30 mg/mL (1 mL) | INTRAMUSCULAR | Status: AC
Start: 2011-10-11 — End: 2011-10-11
  Administered 2011-10-11: 18:00:00 via INTRAVENOUS

## 2011-10-11 MED ADMIN — morphine injection 4 mg: INTRAVENOUS | @ 23:00:00 | NDC 00409125830

## 2011-10-11 MED FILL — MORPHINE 4 MG/ML SYRINGE: 4 mg/mL | INTRAMUSCULAR | Qty: 1

## 2011-10-11 MED FILL — KETOROLAC TROMETHAMINE 30 MG/ML INJECTION: 30 mg/mL (1 mL) | INTRAMUSCULAR | Qty: 1

## 2011-10-11 MED FILL — MD-GASTROVIEW 66 %-10 % ORAL SOLUTION: 66-10 % | ORAL | Qty: 30

## 2011-10-11 MED FILL — OPTIRAY 320 MG IODINE/ML INTRAVENOUS SOLUTION: 320 mg iodine/mL | INTRAVENOUS | Qty: 100

## 2011-10-11 NOTE — ED Notes (Signed)
LLQ pain x  One week  A few weeks ago  Was seen by pmd for  Lower abd pressure  Neg for uti  Treated with cipro now having  Pain

## 2011-10-11 NOTE — ED Notes (Signed)
1845 Discharge instructions given to patient states understands

## 2011-10-11 NOTE — ED Notes (Signed)
Bedside and Verbal shift change report given to Dianna B, RN (oncoming nurse) by Maudry Mayhew., RN (offgoing nurse).  Report given with SBAR, ED Summary and MAR.

## 2011-10-11 NOTE — ED Provider Notes (Addendum)
HPI Comments: Katie Mckay is a 36 y.o. Female who presents to the ER for evaluation of lower right abdominal pain.  She says she has experienced the pain for the past 1-2 weeks.  She says it started as a "pressure," and her PCP Dorcas Mcmurray gave her Cipro, but her urine then came back negative.  Her discomfort has persisted and become more painful than pressure-like.  No diarrhea, vomiting, dysuria, fevers, chills, change in appetite, or heavy vaginal bleeding.  She does admit to some vaginal spotting.  She says that she has Mirena in place and thought this could be related.  Also, she notes that the pain reminds her of past ovarian cyst.  She says heating pad and motrin helped with her pain.  There are no further symptoms or complaints expressed at this time.    Patient is a 36 y.o. female presenting with abdominal pain. The history is provided by the patient.   Abdominal Pain   Associated symptoms include nausea. Pertinent negatives include no fever, no diarrhea, no vomiting, no constipation, no dysuria, no chest pain and no back pain.        Past Medical History   Diagnosis Date   ??? Hypercholesterolemia 2011        Past Surgical History   Procedure Date   ??? Hx appendectomy 2009   ??? Hx cesarean section 2007; 2011   ??? Hx ovarian cyst removal          Family History   Problem Relation Age of Onset   ??? Drug Abuse Mother    ??? Hypertension Mother    ??? Diabetes Mother    ??? Drug Abuse Father    ??? Cancer Father      Prostate   ??? Asthma Father    ??? Hypertension Father    ??? High Cholesterol Father    ??? Asthma Brother    ??? Heart Attack Paternal Grandfather         History     Social History   ??? Marital Status: SINGLE     Spouse Name: N/A     Number of Children: N/A   ??? 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 Smoking status: Never Smoker    ??? Smokeless tobacco: Never Used   ??? Alcohol Use: Yes      occasionally   ??? Drug Use: No   ??? Sexually Active: Yes -- Female  partner(s)     Birth Control/ Protection: IUD     Other Topics Concern   ??? Not on file     Social History Narrative   ??? No narrative on file                  ALLERGIES: Stadol      Review of Systems   Constitutional: Negative for fever, chills and appetite change.   HENT: Negative.    Eyes: Negative.    Respiratory: Negative for shortness of breath.    Cardiovascular: Negative for chest pain.   Gastrointestinal: Positive for nausea and abdominal pain. Negative for vomiting, diarrhea, constipation and blood in stool.   Genitourinary: Negative for dysuria.        +vaginal spotting   Musculoskeletal: Negative for back pain.   Skin: Negative.    Neurological: Negative.    Hematological: Negative.    Psychiatric/Behavioral: Negative for confusion.  Filed Vitals:    10/11/11 1019   BP: 102/73   Pulse: 78   Temp: 99.2 ??F (37.3 ??C)   Resp: 16   Height: 5\' 2"  (1.575 m)   Weight: 65.772 kg (145 lb)   SpO2: 100%            Physical Exam   Constitutional: She is oriented to person, place, and time. She appears well-developed and well-nourished. No distress.   HENT:   Head: Normocephalic and atraumatic.   Eyes: Conjunctivae and EOM are normal. Pupils are equal, round, and reactive to light. Right eye exhibits no discharge. Left eye exhibits no discharge. No scleral icterus.   Neck: Normal range of motion. Neck supple. No JVD present. No tracheal deviation present. No thyromegaly present.   Cardiovascular: Normal rate, regular rhythm and normal heart sounds.  Exam reveals no gallop.    No murmur heard.  Pulmonary/Chest: Effort normal. No stridor. No respiratory distress. She has no wheezes. She has no rales. She exhibits no tenderness.   Abdominal: Soft. Bowel sounds are normal. She exhibits no distension and no mass. There is tenderness. There is no rebound and no guarding.        Right lower quadrant tenderness   Genitourinary: No vaginal discharge found.        No CMT,right adnexal tenderness, no discharge,     Musculoskeletal: She exhibits no edema and no tenderness.   Lymphadenopathy:     She has no cervical adenopathy.   Neurological: She is alert and oriented to person, place, and time. No cranial nerve deficit. Coordination normal.   Skin: Skin is warm and dry. No rash noted. She is not diaphoretic. No erythema. No pallor.   Psychiatric: She has a normal mood and affect. Her behavior is normal. Judgment and thought content normal.        MDM    Procedures    Provider documentation is written by Tana Coast, acting as a scribe for Dr. Jerre Simon, MD.  I have reviewed the information recorded by the scribe and agree with its contents. Dr. Jerre Simon, MD

## 2011-10-11 NOTE — ED Notes (Signed)
Patient sitting up in bed and tearful. Tissue given. Patient informed of status, will continue to monitor for changes.

## 2011-10-11 NOTE — ED Notes (Signed)
Spoke with Dr. Isac Sarna GYN Oncologist reviewed history and labs and CT report.  He will see her tomorrow.  He said he will call her but she will call and ask for GYN Oncology as well.  [phone].  Patient is comfortable and in no acute distress. 5:41 PM

## 2011-10-12 ENCOUNTER — Encounter

## 2011-10-12 LAB — METABOLIC PANEL, BASIC
Anion gap: 12 mmol/L (ref 3.0–18)
BUN/Creatinine ratio: 18 (ref 12–20)
BUN: 17 MG/DL (ref 7.0–18)
CO2: 24 MMOL/L (ref 21–32)
Calcium: 10.3 MG/DL — ABNORMAL HIGH (ref 8.5–10.1)
Chloride: 106 MMOL/L (ref 100–108)
Creatinine: 0.92 MG/DL (ref 0.6–1.3)
GFR est AA: >60 mL/min/{1.73_m2} (ref >60)
GFR est non-AA: >60 mL/min/{1.73_m2} (ref >60)
Glucose: 79 MG/DL (ref 74–99)
Potassium: 4.1 MMOL/L (ref 3.5–5.5)
Sodium: 142 MMOL/L (ref 136–145)

## 2011-10-12 LAB — CBC W/O DIFF
HCT: 42.7 % (ref 35.0–45.0)
HGB: 14.2 g/dL (ref 12.0–16.0)
MCH: 29.6 PG (ref 24.0–34.0)
MCHC: 33.3 g/dL (ref 31.0–37.0)
MCV: 89 FL (ref 74.0–97.0)
MPV: 10.5 FL (ref 9.2–11.8)
PLATELET: 301 10*3/uL (ref 135–420)
RBC: 4.8 M/uL (ref 4.20–5.30)
RDW: 13.3 % (ref 11.6–14.5)
WBC: 6.4 10*3/uL (ref 4.6–13.2)

## 2011-10-13 LAB — EKG, 12 LEAD, INITIAL
Atrial Rate: 75 {beats}/min
Calculated P Axis: 69 degrees
Calculated R Axis: 76 degrees
Calculated T Axis: 66 degrees
Diagnosis: NORMAL
P-R Interval: 156 ms
Q-T Interval: 362 ms
QRS Duration: 102 ms
QTC Calculation (Bezet): 404 ms
Ventricular Rate: 75 {beats}/min

## 2011-10-13 LAB — CHLAMYDIA/GC PCR
Chlamydia amplified: NEGATIVE
N. gonorrhea, amplified: NEGATIVE

## 2011-10-13 LAB — TYPE & SCREEN
ABO/Rh(D): B POS
Antibody screen: NEGATIVE

## 2011-10-13 LAB — TYPE AND SCREEN
ABO/Rh: B POS
Antibody Screen: NEGATIVE

## 2011-10-13 NOTE — H&P (Signed)
Patient: Katie Mckay  Location: [facility]  DOB: 03-31-1976  Attending Physician: Ronna Polio MD  MRN: [id number]  Date: 10/12/2011    Dear Dr. Ala Dach:  Your patient, Katie Mckay, was seen at your request in gynecologic oncology consultation today for evaluation and  management of pelvic mass.  Chief complaint  Katie Mckay is a 36 year old, para who presented to the ED yesterday with the complaint of right loer abdominal  pain and nausea for the previous 2 weeks. She had been seen by her PCP last week for what she states was  initially "pressure" in her lower pelvis and was put on Cipro. Her urine came back negative. An u/s done yesterday  showed a large cystic mass posterior to the uterus, measuring 14.7 X 9.3 X 11.2 cm. A large portion of the mass is  anechoic, but there are septations and a 5.5 X 3.7 cm mural nodule along the posterior wall. The uterus measured 8.6 cm  with an IUD in place. There was no free fluid in the pelvis. A CT scan was then done which confirmed the mass,  displacing the uterus to the right. Also noted were several subtle intraparenchymal hypodense lesions at the right lung  base, uncertain if representing pleural-based mass versus diaphragmatic or subphrenic hepatic mass. There were also  several subtle solid masses along the superior capsule of the liver, highly concerning for carcinomatosis. There was  pancreatic ductal dilation also noted.  Today, she notes pressure in her lower abdomen, occasional constipation and urinary urgency. She denies irregular  bleeding, discharge or fevers.  Work-up, hem/onc  Reason for evaluation: ct scan, ultrasound.  History of Present Illness  See CC.  Subjective:  Past Medical History  hypercholesterolemia.  Previous surgery: Appendectomy, 2009, cesarean section, number of cesarean sections: 2, Ovarian cyst removal.  Review of Systems  Constitutional: no weight loss, no fever, no chills, no night sweats, energy level good.   Eyes: no diplopia, no transient or permanent loss of vision, no scotomata.  ENT/mouth: no tinnitus, normal hearing, no epistaxis, no hoarseness, no oral ulcers, no gingival bleeding, no sore  throat, no postnasal drip, no nasal drip, no mouth pain, no sinus pain, no dysphagia.  Cardiovascular: no chest pain, no palpitations, no syncope, no upper extremity edema, no lower extremity edema, no  calf discomfort.  Respiratory: no cough, no hemoptysis, no dyspnea, no pleurisy, no wheezing.  Breast: no breast mass, no pain, no nipple discharge, no change in size, no change in shape.  Gastrointestinal: abdominal pain: RLQ, onset: within the past several weeks. constipation: onset: within the  past several weeks. no nausea, no vomiting, no hematochezia, no jaundice, no abdominal cramping, no  hematemesis, no diarrhea, no melena, no dyspepsia, no dysphagia.  Female urinary: urinary urgency. no dysuria, no hematuria, no nocturia, no urinary incontinence.  Gynecological: no vaginal discharge, no suprapubic pain, no abnormal vaginal bleeding.  Musculoskeletal: no muscle pain, no swollen joints, no joint redness, no bone pain, no spine tenderness.  Skin: no rash, no nodules, no pruritus, no lesions.  Neurologic: no confusion, no seizures, no syncope, no tremor, no speech change, no headache, no hiccups, no  abnormal gait, no weakness, no sensory changes.  Psychiatric: no depression, no anxiety, concentration normal.  Endocrine: no polyuria, no polydipsia, no thyroid disease symptoms, no hot flashes.  Hematologic: no epistaxis, no gingival bleeding, no petechiae, no ecchymosis.  Lymphatic: no lymphadenopathy, no lymphedema.  Allergy/immunologic: no eczema, no frequent mucous membrane infections,  no frequent respiratory infections, no  recurrent urticaria, no frequent skin infections.  Health Maintenance  Tobacco history: Never smoker.  Last pap smear: 11/2010.  Last pelvic exam: 10/11/2011.  Ob/Gyn History  Ob/Gyn History.   Pregnancy history: para: 2.  Family History  Father: Prostate cancer.  Social History  Married, Occupation: pharmacist.working, Alcohol use: occasional, Drugs of abuse: none.  Objective:  Physical Exam  Vital signs: height: 62 in (157 cm), weight: 151 lbs (68 kg), blood pressure: 121/88, Cuff size: regular, pulse: 84 bpm,  temperature: 98.1 deg F (36.7 deg C), pain scale (0-10): 0, body mass index (BMI): 27.62 kg/m2, Overweight.  Constitutional: normal appearance, no acute distress.  Eyes: conjunctivae normal, eyelids normal, PERRLA, anicteric.  Ears, nose, throat: external ears and nose normal, hearing grossly normal, oropharynx unremarkable.  Neck: supple, no masses.  Respiratory: breathing comfortably, lungs clear to auscultation and percussion.  Cardiovascular: normal heart sounds, heart rhythm regular, no peripheral edema.  Abdomen: Abdominopelvic mass extending to umbilicus. Multiple surgical scars. no hepatomegaly, no  splenomegaly, bowel sounds normal.  Pelvic: Large cystic mass lower pelvis extending to umbilicus. Uterus deviated to right and anteriorly. No  nodularity. Moderate tenderness. Cervix deviated to right. No lesions. external genitalia unremarkable, urethra  normal, bladder unremarkable.Accompanied by: female staff member.  Lymph nodes: no cervical adenopathy, no supraclavicular adenopathy, no inguinal adenopathy.  Musculoskeletal: normal muscle strength.  Neurologic: no focal motor deficit, normal gait, no abnormal mental status.  Psychiatric: oriented to person, time and place, mood and affect appropriate to situation, appropriate judgement and  insight, memory intact.  Laboratory  Tumor markers: CA 125: 46.0.  Current Medications  Fish oil concentrate, po solid (omega-3 fatty acids/fish oil): Outside Rx.  Klonopin, po solid (clonazepam): Outside Rx: 0.5 mg Tablet(s) Take 1 PO TID.  Mirena (levonorgestrel): Outside Rx: 32mcg/24hr Intrauterine device Take 1 Unit Intrauterine cavity as directed.   Multivitamin, po solid: Outside Rx: Tablet(s) Take 1 PO daily.  Vitamin d, po solid (cholecalciferol (vitamin d3)): Outside Rx.  Allergies & Adverse Reactions  Stadol ns, aero (butorphanol tartrate), group: opioids - morphine analogues.  Problem List  Pelvic mass  Clinical Impression and Plan  Impression:  1. 14cm complex cystic pelvic mass on CT scan, 10/11/11. This study also reveals 3cm soft tissue density near dome  of liver. Multiple hypodense lesions of liver. There is no adenopathy or ascites.  2. Borderline pancreatic ductal dilatation with no definite extrinsic mass on CT scan.  3. Prior appendectomy and cecectomy in 2009.  4. Multiple ovarian cystectomies (2005, 2010)  5. C-section X 2.  6. Mirena IUD.  Plan:  1. I have discussed the differential diagnosis with Katie Mckay and her husband, Katie Mckay, including the possibility of a  malignant process. I have recommended exploratory laparotomy with TAH/BSO, resection of perihepatic mass,  frozen section analysis, and appropriate debulking performed. If a malignancy is identified then hepatic  parenchymal lesions will be biopsied either perioperatively or by CT-guidance postoperatively. The risks were  reviewed, their questions were answered, and written consent obtained. Preoperative teaching provided today.  2. CA-125 today.  3. Katie Mckay is aware that she will need to return to the office 2 weeks following her procedure for discussion of  results.  4. Surgery is scheduled on 10/18/11 at [facility].  Documentation provided by Clyda Hurdle, scribe, for Dr. Karl Ito, 10/12/11.  New Orders  ? RTC MD: Prior to next visit, Print on Rx., Instructions/Comments: Post op 2 weeks after 10/18/11 surgery., v.o.  Ronna Polio  MD.  ? Patient status for work/school: Today, Print on Rx., Instructions/Comments: Mrs. Seldon has been scheduled for  surgery on 10/18/11. Post op recovery period is approximately 8 weeks. Please contact my office with any  questions.  , v.o. Ronna Polio  MD.  ? Patient status for work/school: Today, Print on Rx., Instructions/Comments: Please excuse Katie Mckay from  school on 10/18/11, 10/19/11, 10/20/11 as he will need to care patient after surgery., v.o. Ronna Polio MD.  ? CA 125: Today, Print on Rx.  Electronic signature: visit reviewed and electronically signed.  Thank you for the opportunity to share in the care of this pleasant patient.  Sincerely.  Ronna Polio MD  Send copy of note to: Dorcas Mcmurray, MD  Lewanda Rife, MD.

## 2011-10-13 NOTE — H&P (View-Only) (Signed)
Patient: Katie Mckay  Location: [facility]  DOB: 05/10/1976  Attending Physician: Takirah Binford MD  MRN: [id number]  Date: 10/12/2011    Dear Dr. Ford:  Your patient, Katie Mckay, was seen at your request in gynecologic oncology consultation today for evaluation and  management of pelvic mass.  Chief complaint  [name] is a 36 year old, para who presented to the ED yesterday with the complaint of right loer abdominal  pain and nausea for the previous 2 weeks. She had been seen by her PCP last week for what she states was  initially "pressure" in her lower pelvis and was put on Cipro. Her urine came back negative. An u/s done yesterday  showed a large cystic mass posterior to the uterus, measuring 14.7 X 9.3 X 11.2 cm. A large portion of the mass is  anechoic, but there are septations and a 5.5 X 3.7 cm mural nodule along the posterior wall. The uterus measured 8.6 cm  with an IUD in place. There was no free fluid in the pelvis. A CT scan was then done which confirmed the mass,  displacing the uterus to the right. Also noted were several subtle intraparenchymal hypodense lesions at the right lung  base, uncertain if representing pleural-based mass versus diaphragmatic or subphrenic hepatic mass. There were also  several subtle solid masses along the superior capsule of the liver, highly concerning for carcinomatosis. There was  pancreatic ductal dilation also noted.  Today, she notes pressure in her lower abdomen, occasional constipation and urinary urgency. She denies irregular  bleeding, discharge or fevers.  Work-up, hem/onc  Reason for evaluation: ct scan, ultrasound.  History of Present Illness  See CC.  Subjective:  Past Medical History  hypercholesterolemia.  Previous surgery: Appendectomy, 2009, cesarean section, number of cesarean sections: 2, Ovarian cyst removal.  Review of Systems  Constitutional: no weight loss, no fever, no chills, no night sweats, energy level good.   Eyes: no diplopia, no transient or permanent loss of vision, no scotomata.  ENT/mouth: no tinnitus, normal hearing, no epistaxis, no hoarseness, no oral ulcers, no gingival bleeding, no sore  throat, no postnasal drip, no nasal drip, no mouth pain, no sinus pain, no dysphagia.  Cardiovascular: no chest pain, no palpitations, no syncope, no upper extremity edema, no lower extremity edema, no  calf discomfort.  Respiratory: no cough, no hemoptysis, no dyspnea, no pleurisy, no wheezing.  Breast: no breast mass, no pain, no nipple discharge, no change in size, no change in shape.  Gastrointestinal: abdominal pain: RLQ, onset: within the past several weeks. constipation: onset: within the  past several weeks. no nausea, no vomiting, no hematochezia, no jaundice, no abdominal cramping, no  hematemesis, no diarrhea, no melena, no dyspepsia, no dysphagia.  Female urinary: urinary urgency. no dysuria, no hematuria, no nocturia, no urinary incontinence.  Gynecological: no vaginal discharge, no suprapubic pain, no abnormal vaginal bleeding.  Musculoskeletal: no muscle pain, no swollen joints, no joint redness, no bone pain, no spine tenderness.  Skin: no rash, no nodules, no pruritus, no lesions.  Neurologic: no confusion, no seizures, no syncope, no tremor, no speech change, no headache, no hiccups, no  abnormal gait, no weakness, no sensory changes.  Psychiatric: no depression, no anxiety, concentration normal.  Endocrine: no polyuria, no polydipsia, no thyroid disease symptoms, no hot flashes.  Hematologic: no epistaxis, no gingival bleeding, no petechiae, no ecchymosis.  Lymphatic: no lymphadenopathy, no lymphedema.  Allergy/immunologic: no eczema, no frequent mucous membrane infections,   no frequent respiratory infections, no  recurrent urticaria, no frequent skin infections.  Health Maintenance  Tobacco history: Never smoker.  Last pap smear: 11/2010.  Last pelvic exam: 10/11/2011.  Ob/Gyn History  Ob/Gyn History.   Pregnancy history: para: 2.  Family History  Father: Prostate cancer.  Social History  Married, Occupation: pharmacist.working, Alcohol use: occasional, Drugs of abuse: none.  Objective:  Physical Exam  Vital signs: height: 62 in (157 cm), weight: 151 lbs (68 kg), blood pressure: 121/88, Cuff size: regular, pulse: 84 bpm,  temperature: 98.1 deg F (36.7 deg C), pain scale (0-10): 0, body mass index (BMI): 27.62 kg/m2, Overweight.  Constitutional: normal appearance, no acute distress.  Eyes: conjunctivae normal, eyelids normal, PERRLA, anicteric.  Ears, nose, throat: external ears and nose normal, hearing grossly normal, oropharynx unremarkable.  Neck: supple, no masses.  Respiratory: breathing comfortably, lungs clear to auscultation and percussion.  Cardiovascular: normal heart sounds, heart rhythm regular, no peripheral edema.  Abdomen: Abdominopelvic mass extending to umbilicus. Multiple surgical scars. no hepatomegaly, no  splenomegaly, bowel sounds normal.  Pelvic: Large cystic mass lower pelvis extending to umbilicus. Uterus deviated to right and anteriorly. No  nodularity. Moderate tenderness. Cervix deviated to right. No lesions. external genitalia unremarkable, urethra  normal, bladder unremarkable.Accompanied by: female staff member.  Lymph nodes: no cervical adenopathy, no supraclavicular adenopathy, no inguinal adenopathy.  Musculoskeletal: normal muscle strength.  Neurologic: no focal motor deficit, normal gait, no abnormal mental status.  Psychiatric: oriented to person, time and place, mood and affect appropriate to situation, appropriate judgement and  insight, memory intact.  Laboratory  Tumor markers: CA 125: 46.0.  Current Medications  Fish oil concentrate, po solid (omega-3 fatty acids/fish oil): Outside Rx.  Klonopin, po solid (clonazepam): Outside Rx: 0.5 mg Tablet(s) Take 1 PO TID.  Mirena (levonorgestrel): Outside Rx: 20mcg/24hr Intrauterine device Take 1 Unit Intrauterine cavity as directed.   Multivitamin, po solid: Outside Rx: Tablet(s) Take 1 PO daily.  Vitamin d, po solid (cholecalciferol (vitamin d3)): Outside Rx.  Allergies & Adverse Reactions  Stadol ns, aero (butorphanol tartrate), group: opioids - morphine analogues.  Problem List  Pelvic mass  Clinical Impression and Plan  Impression:  1. 14cm complex cystic pelvic mass on CT scan, 10/11/11. This study also reveals 3cm soft tissue density near dome  of liver. Multiple hypodense lesions of liver. There is no adenopathy or ascites.  2. Borderline pancreatic ductal dilatation with no definite extrinsic mass on CT scan.  3. Prior appendectomy and cecectomy in 2009.  4. Multiple ovarian cystectomies (2005, 2010)  5. C-section X 2.  6. Mirena IUD.  Plan:  1. I have discussed the differential diagnosis with Jennae and her husband, Mario, including the possibility of a  malignant process. I have recommended exploratory laparotomy with TAH/BSO, resection of perihepatic mass,  frozen section analysis, and appropriate debulking performed. If a malignancy is identified then hepatic  parenchymal lesions will be biopsied either perioperatively or by CT-guidance postoperatively. The risks were  reviewed, their questions were answered, and written consent obtained. Preoperative teaching provided today.  2. CA-125 today.  3. Laurren is aware that she will need to return to the office 2 weeks following her procedure for discussion of  results.  4. Surgery is scheduled on 10/18/11 at [facility].  Documentation provided by Beth Jennings, scribe, for Dr. Charyl Minervini, 10/12/11.  New Orders  ? RTC MD: Prior to next visit, Print on Rx., Instructions/Comments: Post op 2 weeks after 10/18/11 surgery., v.o.  Khairi Garman   MD.  ? Patient status for work/school: Today, Print on Rx., Instructions/Comments: Mrs. Bouie has been scheduled for  surgery on 10/18/11. Post op recovery period is approximately 8 weeks. Please contact my office with any  questions.  , v.o. Kimberley Dastrup  MD.  ? Patient status for work/school: Today, Print on Rx., Instructions/Comments: Please excuse Mario Goodson from  school on 10/18/11, 10/19/11, 10/20/11 as he will need to care patient after surgery., v.o. Arvle Grabe MD.  ? CA 125: Today, Print on Rx.  Electronic signature: visit reviewed and electronically signed.  Thank you for the opportunity to share in the care of this pleasant patient.  Sincerely.  Johnnie Goynes MD  Send copy of note to: Lisa Ford, MD  Benigno Federici, MD.

## 2011-10-17 NOTE — Other (Signed)
Surgical Pre-Admission Instructions given to patient by phone.     FOR YOUR SAFETY PLEASE FOLLOW THE FOLLOWING INSTRUCTIONS:  ??? Do NOT eat or drink anything, including candy and gum after Midnight 10/17/11, unless you have specific instructions from your Surgeon/Anesthesia to do so.  ???Take these medications the morning of surgery with a sip of water.NONE  ???No smoking on the day of your surgery.  No alcohol 24 hours prior to surgery.  No recreational drugs for one week prior to surgery  ???Leave all valuables including money/purse at home  ???Remove all jewelry.  Remove nail polish.  Remove makeup(Mascara). No lotions, powders, deodorant, perfumes/cologne/after shaves  ???Glasses/Contact Lenses and Dentures may be worn to the hospital.  They will be removed prior to the surgery.    Instructed patient to use CHG solution, Skin Prep Cloths with CHG or to shower with antibacterial soap the evening before surgery.

## 2011-10-18 ENCOUNTER — Inpatient Hospital Stay
Admit: 2011-10-18 | Discharge: 2011-10-20 | Disposition: A | Discharge status: Home / Self Care | Payer: PRIVATE HEALTH INSURANCE | Attending: Gynecologic Oncology | Admitting: Gynecologic Oncology

## 2011-10-18 DIAGNOSIS — D279 Benign neoplasm of unspecified ovary: Secondary | ICD-10-CM

## 2011-10-18 LAB — GLUCOSE, POC: Glucose (POC): 72 mg/dL (ref 70–110)

## 2011-10-18 LAB — HCG URINE, QL. - POC: Pregnancy test,urine (POC): NEGATIVE

## 2011-10-18 MED ORDER — SODIUM CHLORIDE 0.9 % IV PIGGY BACK
1 gram | Freq: Once | INTRAVENOUS | Status: AC
Start: 2011-10-18 — End: 2011-10-18
  Administered 2011-10-18: 19:00:00 via INTRAVENOUS

## 2011-10-18 MED ADMIN — fentaNYL (PF) 10 mcg/mL PCA/infusion: INTRAVENOUS | NDC 71500011001

## 2011-10-18 MED ADMIN — fentaNYL (PF) 10 mcg/mL PCA/infusion: INTRAVENOUS | @ 23:00:00 | NDC 99999175810

## 2011-10-18 MED ADMIN — ondansetron (ZOFRAN) injection 4 mg: INTRAVENOUS | @ 22:00:00 | NDC 00781301072

## 2011-10-18 MED ADMIN — sodium chloride irrigation 0.9 % 3,000 mL Irrigation: @ 20:00:00 | NDC 00409797208

## 2011-10-18 MED ADMIN — fentaNYL (PF) 10 mcg/mL PCA/infusion: INTRAVENOUS | @ 21:00:00 | NDC 99999175810

## 2011-10-18 MED ADMIN — fentaNYL citrate (PF) injection 50 mcg: INTRAVENOUS | @ 21:00:00 | NDC 00641602701

## 2011-10-18 MED ADMIN — fentaNYL citrate (PF) injection 50 mcg: INTRAVENOUS | @ 20:00:00 | NDC 00641602701

## 2011-10-18 MED ADMIN — famotidine (PEPCID) tablet 20 mg: ORAL | @ 16:00:00 | NDC 00172572860

## 2011-10-18 MED ADMIN — lactated ringers infusion: INTRAVENOUS | @ 16:00:00 | NDC 00409795309

## 2011-10-18 MED ADMIN — lactated ringers infusion: INTRAVENOUS | @ 19:00:00 | NDC 00409795309

## 2011-10-18 MED FILL — ONDANSETRON (PF) 4 MG/2 ML INJECTION: 4 mg/2 mL | INTRAMUSCULAR | Qty: 2

## 2011-10-18 MED FILL — MIDAZOLAM 1 MG/ML IJ SOLN: 1 mg/mL | INTRAMUSCULAR | Qty: 5

## 2011-10-18 MED FILL — FENTANYL CITRATE (PF) 50 MCG/ML IJ SOLN: 50 mcg/mL | INTRAMUSCULAR | Qty: 5

## 2011-10-18 MED FILL — LACTATED RINGERS IV: INTRAVENOUS | Qty: 1000

## 2011-10-18 MED FILL — FENTANYL CITRATE (PF) 50 MCG/ML IJ SOLN: 50 mcg/mL | INTRAMUSCULAR | Qty: 2

## 2011-10-18 MED FILL — FENTANYL IN NS (PF) 10 MCG/ML IN 0.9% NACL INFUSION: 10 mcg/mL | INTRAVENOUS | Qty: 100

## 2011-10-18 MED FILL — FAMOTIDINE 20 MG TAB: 20 mg | ORAL | Qty: 1

## 2011-10-18 MED FILL — MORPHINE 10 MG/ML IJ SOLN: 10 mg/mL | INTRAMUSCULAR | Qty: 1

## 2011-10-18 NOTE — Op Note (Signed)
OPERATION    LOCATION OF PATIENT:  [facility]    DATE OF OPERATION:  10/18/2011    SURGEON:  Areta Haber, MD    ASSISTANTS:  Parthenia Ames, SA  Erin Sons, MD    PREOPERATIVE DIAGNOSIS:  1.Large complex pelvic mass.    POSTOPERATIVE DIAGNOSIS:  1. Benign right ovarian cystadenoma (benign)    OPERATION PERFORMED:  1. Laparotomy with right salpingo-oophorectomy.      INDICATIONS:  The patient is a 36 y.o. year-old female admitted with history of a large complex pelvic mass found on imaging studies.  Preop CT showed the mass as well as nodularity superior to the liver.   Definitive surgical therapy was recommended for definitive diagnosis.  She was informed of potential risks, her questions were answered and written consent was obtained.     ANESTHESIA:  General.    FINDINGS:  At laparotomy, a large complex mass of the right ovary was found.  This was adherent to the pelvic sidewall and omentum.  The uterus and left adnexa were normal in appearance.  There was no ascites.  There were no enlarged lymph nodes or tumor implants.  There was significant scarring of the right hemidiaphragm with attachment to the liver.  Small 1-2 cm nodules of the liver at this point had the appearance of accessory liver tissue.  The frozen section of the right ovary revealed benign cystadenoma.    DESCRIPTION OF OPERATION:  The patient was identified and transported to the operating room. Appropriate  monitors were affixed. Following induction of general anesthesia, she was  intubated without incident. A Foley catheter was placed. She was prepped  and draped in the supine position. A surgical time-out was taken to verify  correct patient, procedure, site and precautions. A vertical midline incision  was then made extending from 4 inches above the umbilicus to the symphysis.     A large cystic mass arising from the right ovary was noted.  This was smooth walled with some surrounding adhesions but no excrescences.   There  was no ascites and there were no visible tumor implants.  The uterus and left adnexa appeared normal. The self-retaining retractor was placed.  The bowel was packed out of the pelvis. Findings were as noted above.   The mass was mobilized and a window created in the broad ligament.  The infundibulopelvic ligament, proximal fallopian tube and uter ovarian ligament were sealed and transected with the Ligasure.  The right adnexal mass was submitted for pathologic evaluation and that report eventually returned as benign cystadenoma.    The pelvis was irrigated and excellent hemostasis was observed. The bowel and  omentum were then replaced in the pelvis. The self-retaining retractor and  packs were removed and accounted for. The fascia and peritoneum were closed in  a single layer mass closure fashion using #1 PDS loop. The  skin edges were reapproximated with clips.    At the conclusion of the procedure, sponge, needle, instrument counts were  given as correct x 2 by the circulating nurse and the scrub tech. The estimated  blood loss was <100 cc. The patient was awakened, extubated, transferred to the recovery  room in satisfactory condition.      ______________________________  SIGNED: Areta Haber, MD

## 2011-10-18 NOTE — Other (Signed)
TRANSFER - OUT REPORT:    Verbal report given to Tammy Gaulon Elige Ko  being transferred to 2200 for routine post - op       Report consisted of patient???s Situation, Background, Assessment and   Recommendations(SBAR).     Information from the following report(s) SBAR, OR Summary and MAR was reviewed with the receiving nurse.    Opportunity for questions and clarification was provided.

## 2011-10-18 NOTE — Interval H&P Note (Signed)
Date of Surgery Update:  Katie Mckay was seen and examined.  History and physical has been reviewed. There have been no significant clinical changes since the completion of the originally dated History and Physical.    Signed By: Areta Haber, MD     October 18, 2011 2:44 PM

## 2011-10-18 NOTE — Progress Notes (Signed)
Chaplain completed the initial spiritual assessment of the patient and offered pastoral care to her and her family.   Patient did not indicate any religious/cultural needs that will affect her preferences in health care.   Chaplains will continue to follow and will provide pastoral care on an as needed/requested basis.     Chaplain Sadie Haber  Board Certified Chaplain   [facility]   [phone]

## 2011-10-18 NOTE — Progress Notes (Signed)
Bedside and Verbal shift change report given to Delia Devera RN (oncoming nurse) by Tammy Gaul RN (offgoing nurse).  Report given with SBAR, Kardex and MAR.

## 2011-10-18 NOTE — Progress Notes (Signed)
Received to room. Pain 7/10 to abd. PCA/IVF maintained. Family @ BS. Oriented to room/unit. No distress noted.

## 2011-10-18 NOTE — Other (Signed)
Family at bedside

## 2011-10-18 NOTE — Op Note (Signed)
OPERATION    LOCATION OF PATIENT:  [facility]    DATE OF OPERATION:  10/18/2011    SURGEON:  Shenouda Genova E Lazer Wollard, MD    ASSISTANTS:  Roger Wright, SA  Marie Shockley, MD    PREOPERATIVE DIAGNOSIS:  1.Large complex pelvic mass.    POSTOPERATIVE DIAGNOSIS:  1. Benign right ovarian cystadenoma (benign)    OPERATION PERFORMED:  1. Laparotomy with right salpingo-oophorectomy.      INDICATIONS:  The patient is a 35 y.o. year-old female admitted with history of a large complex pelvic mass found on imaging studies.  Preop CT showed the mass as well as nodularity superior to the liver.   Definitive surgical therapy was recommended for definitive diagnosis.  She was informed of potential risks, her questions were answered and written consent was obtained.     ANESTHESIA:  General.    FINDINGS:  At laparotomy, a large complex mass of the right ovary was found.  This was adherent to the pelvic sidewall and omentum.  The uterus and left adnexa were normal in appearance.  There was no ascites.  There were no enlarged lymph nodes or tumor implants.  There was significant scarring of the right hemidiaphragm with attachment to the liver.  Small 1-2 cm nodules of the liver at this point had the appearance of accessory liver tissue.  The frozen section of the right ovary revealed benign cystadenoma.    DESCRIPTION OF OPERATION:  The patient was identified and transported to the operating room. Appropriate  monitors were affixed. Following induction of general anesthesia, she was  intubated without incident. A Foley catheter was placed. She was prepped  and draped in the supine position. A surgical time-out was taken to verify  correct patient, procedure, site and precautions. A vertical midline incision  was then made extending from 4 inches above the umbilicus to the symphysis.     A large cystic mass arising from the right ovary was noted.  This was smooth walled with some surrounding adhesions but no excrescences.   There  was no ascites and there were no visible tumor implants.  The uterus and left adnexa appeared normal. The self-retaining retractor was placed.  The bowel was packed out of the pelvis. Findings were as noted above.   The mass was mobilized and a window created in the broad ligament.  The infundibulopelvic ligament, proximal fallopian tube and uter ovarian ligament were sealed and transected with the Ligasure.  The right adnexal mass was submitted for pathologic evaluation and that report eventually returned as benign cystadenoma.    The pelvis was irrigated and excellent hemostasis was observed. The bowel and  omentum were then replaced in the pelvis. The self-retaining retractor and  packs were removed and accounted for. The fascia and peritoneum were closed in  a single layer mass closure fashion using #1 PDS loop. The  skin edges were reapproximated with clips.    At the conclusion of the procedure, sponge, needle, instrument counts were  given as correct x 2 by the circulating nurse and the scrub tech. The estimated  blood loss was <100 cc. The patient was awakened, extubated, transferred to the recovery  room in satisfactory condition.      ______________________________  SIGNED: Nichoel Digiulio E Dayshon Roback, MD

## 2011-10-18 NOTE — Addendum Note (Signed)
Addended by: Ronna Polio E on: 10/18/2011 02:42 PM     Modules accepted: Orders

## 2011-10-18 NOTE — Brief Op Note (Signed)
Brief Operative Report    Patient: Katie Mckay MRN: [id number]  SSN: [id number]    Date of Birth: Nov 25, 1975  Age: 36 y.o.  Sex: female       Date of Surgery: 10/18/2011     Surgeon: Dr. Karl Ito    Assistant: Parthenia Ames, SA, Villa Herb. Valla Leaver, MD PGY-2    Preoperative Diagnosis: Large complex pelvic mass     Postoperative Diagnosis: Benign right ovarian cystadenoma    Anesthesia: General     Complications: None    Estimated Blood Loss:  150 ml    Findings: Large complex right ovarian mass, small liver nodules composed of grossly normal appearing liver parenchyma, normal appearing uterus and left adnexa, no evidence of cancer or metastatic disease    Procedure: Exploratory laparotomy, right salpingo-oophorectomy           Specimens:   ID Type Source Tests Collected by Time Destination   1 : right tube and ovary Frozen Section Ovary  Areta Haber, MD 10/18/2011 1520 Pathology   1 : pelvic washings Other Pelvis  Areta Haber, MD 10/18/2011 1515 Cytology           Drains: None                Counts: Sponge and needle counts were correct times two    Dispo: PACU    Condition: Stable    Brecken Dewoody E. Valla Leaver, MD PGY-2  [facility] Obstetrics & Gynecology  Pager: [phone]

## 2011-10-18 NOTE — Other (Signed)
Post-Anesthesia Evaluation & Assessment    Visit Vitals   Item Reading   ??? BP 111/57   ??? Pulse 73   ??? Temp 98.1 ??F (36.7 ??C)   ??? Resp 17   ??? SpO2 98%       Nausea/Vomiting: no nausea and no vomiting    Post-operative hydration adequate.    Pain score (VAS): 1    Mental status & Level of consciousness: alert and oriented x 3    Neurological status: moves all extremities, sensation grossly intact    Pulmonary status: airway patent, no supplemental oxygen required    Complications related to anesthesia: none    Patient has met all discharge requirements.    Additional comments:        Guss Bunde, MD  October 18, 2011

## 2011-10-18 NOTE — Other (Signed)
Spoke with waiting room volunteer who will update husband.

## 2011-10-19 LAB — METABOLIC PANEL, BASIC
Anion gap: 10 mmol/L (ref 3.0–18)
BUN/Creatinine ratio: 11 — ABNORMAL LOW (ref 12–20)
BUN: 9 MG/DL (ref 7.0–18)
CO2: 24 MMOL/L (ref 21–32)
Calcium: 8.9 MG/DL (ref 8.5–10.1)
Chloride: 103 MMOL/L (ref 100–108)
Creatinine: 0.83 MG/DL (ref 0.6–1.3)
GFR est AA: >60 mL/min/{1.73_m2} (ref >60)
GFR est non-AA: >60 mL/min/{1.73_m2} (ref >60)
Glucose: 86 MG/DL (ref 74–99)
Potassium: 4.1 MMOL/L (ref 3.5–5.5)
Sodium: 137 MMOL/L (ref 136–145)

## 2011-10-19 LAB — CBC W/O DIFF
HCT: 35.1 % (ref 35.0–45.0)
HGB: 11.6 g/dL — ABNORMAL LOW (ref 12.0–16.0)
MCH: 28.8 PG (ref 24.0–34.0)
MCHC: 33 g/dL (ref 31.0–37.0)
MCV: 87.1 FL (ref 74.0–97.0)
MPV: 9.7 FL (ref 9.2–11.8)
PLATELET: 267 10*3/uL (ref 135–420)
RBC: 4.03 M/uL — ABNORMAL LOW (ref 4.20–5.30)
RDW: 13.1 % (ref 11.6–14.5)
WBC: 10.5 10*3/uL (ref 4.6–13.2)

## 2011-10-19 MED ORDER — PHARMACY INFORMATION NOTE
Freq: Once | Status: AC
Start: 2011-10-19 — End: 2011-10-20

## 2011-10-19 MED ADMIN — fentaNYL (PF) 10 mcg/mL PCA/infusion: INTRAVENOUS | @ 22:00:00 | NDC 99999175810

## 2011-10-19 MED ADMIN — ketorolac (TORADOL) injection 30 mg: INTRAVENOUS | @ 18:00:00 | NDC 64679075801

## 2011-10-19 MED ADMIN — lactated ringers infusion: INTRAVENOUS | @ 20:00:00 | NDC 00409795309

## 2011-10-19 MED ADMIN — fentaNYL (PF) 10 mcg/mL PCA/infusion: INTRAVENOUS | @ 12:00:00 | NDC 99999175810

## 2011-10-19 MED ADMIN — lactated ringers infusion: INTRAVENOUS | @ 03:00:00 | NDC 00409795309

## 2011-10-19 MED ADMIN — lactated ringers infusion: INTRAVENOUS | @ 12:00:00 | NDC 00409795309

## 2011-10-19 MED ADMIN — enoxaparin (LOVENOX) injection 40 mg: SUBCUTANEOUS | @ 17:00:00 | NDC 00075062040

## 2011-10-19 MED ADMIN — ketorolac (TORADOL) injection 30 mg: INTRAVENOUS | @ 12:00:00 | NDC 00409379501

## 2011-10-19 MED ADMIN — ketorolac (TORADOL) injection 30 mg: INTRAVENOUS | NDC 64679075801

## 2011-10-19 MED FILL — FENTANYL IN NS (PF) 10 MCG/ML IN 0.9% NACL INFUSION: 10 mcg/mL | INTRAVENOUS | Qty: 100

## 2011-10-19 MED FILL — KETOROLAC TROMETHAMINE 30 MG/ML INJECTION: 30 mg/mL (1 mL) | INTRAMUSCULAR | Qty: 1

## 2011-10-19 MED FILL — PHENYLEPHRINE 10 MG/ML INJECTION: 10 mg/mL | INTRAMUSCULAR | Qty: 1

## 2011-10-19 MED FILL — QUELICIN 20 MG/ML INJECTION SOLUTION: 20 mg/mL | INTRAMUSCULAR | Qty: 10

## 2011-10-19 MED FILL — DEXAMETHASONE SODIUM PHOSPHATE 4 MG/ML IJ SOLN: 4 mg/mL | INTRAMUSCULAR | Qty: 1

## 2011-10-19 MED FILL — VECURONIUM BROMIDE 10 MG IV SOLR: 10 mg | INTRAVENOUS | Qty: 1

## 2011-10-19 MED FILL — PHARMACY INFORMATION NOTE: Qty: 1

## 2011-10-19 MED FILL — DIPRIVAN 10 MG/ML INTRAVENOUS EMULSION: 10 mg/mL | INTRAVENOUS | Qty: 20

## 2011-10-19 MED FILL — ONDANSETRON (PF) 4 MG/2 ML INJECTION: 4 mg/2 mL | INTRAMUSCULAR | Qty: 2

## 2011-10-19 MED FILL — NEOSTIGMINE METHYLSULFATE 1 MG/ML INJECTION: 1 mg/mL | INTRAMUSCULAR | Qty: 10

## 2011-10-19 MED FILL — CEFAZOLIN 1 GRAM SOLUTION FOR INJECTION: 1 gram | INTRAMUSCULAR | Qty: 1

## 2011-10-19 MED FILL — GLYCOPYRROLATE 0.2 MG/ML IJ SOLN: 0.2 mg/mL | INTRAMUSCULAR | Qty: 2

## 2011-10-19 MED FILL — LOVENOX 40 MG/0.4 ML SUBCUTANEOUS SYRINGE: 40 mg/0.4 mL | SUBCUTANEOUS | Qty: 0.4

## 2011-10-19 NOTE — Progress Notes (Signed)
Progress Note    Patient: AMABEL STMARIE MRN: [id number]  SSN: [id number]    Date of Birth: 27-Apr-1976  Age: 36 y.o.  Sex: female      Admit Date: 10/18/2011    LOS: 1 day     Subjective:   Patient is awake and alert on my entrance to the room.  She is tolerating ice chips and sips of clears without difficulty.  She reports inadequate pain control on Fentanyl PCA (rated consistently 8/10 on the pain scale).  She has not yet been ambulatory.  Foley remains in place.     Objective:     Filed Vitals:    10/18/11 1843 10/18/11 2031 10/19/11 0017 10/19/11 0412   BP: 111/60 100/67 101/68 103/59   Pulse: 74 105 110 97   Temp: 97.6 ??F (36.4 ??C) 97.9 ??F (36.6 ??C) 98.8 ??F (37.1 ??C) 98.9 ??F (37.2 ??C)   Resp: 14 16 18 18    SpO2: 97% 96% 99% 95%        Intake and Output:  1527 IV Fluids/1300 ml UOP    Physical Exam:   Gen: Pleasant, cooperative, NAD  Cards: RRR, 2+ pulses  Pulm: CTAB  Abd: Soft, diffusely but appropriately tender without rebound or guarding, no bowel sounds appreciated, bandage is clean and dry over midline incision   Ext: No signs of DVT, SCDs are worn    Lab/Data Review:  Recent Results (from the past 24 hour(s))   GLUCOSE, POC    Collection Time    10/18/11 12:31 PM       Component Value Range    Glucose - (glucometer POC) 72  70 - 110 mg/dL   CBC W/O DIFF    Collection Time    10/19/11  5:59 AM       Component Value Range    WBC 10.5  4.6 - 13.2 K/uL    RBC 4.03 (*) 4.20 - 5.30 M/uL    HGB 11.6 (*) 12.0 - 16.0 g/dL    HCT 86.5  78.4 - 69.6 %    MCV 87.1  74.0 - 97.0 FL    MCH 28.8  24.0 - 34.0 PG    MCHC 33.0  31.0 - 37.0 g/dL    RDW 29.5  28.4 - 13.2 %    PLATELET 267  135 - 420 K/uL    MPV 9.7  9.2 - 11.8 FL   METABOLIC PANEL, BASIC    Collection Time    10/19/11  5:59 AM       Component Value Range    Sodium 137  136 - 145 MMOL/L    Potassium 4.1  3.5 - 5.5 MMOL/L    Chloride 103  100 - 108 MMOL/L    CO2 24  21 - 32 MMOL/L    Anion gap 10  3.0 - 18 mmol/L    Glucose 86  74 - 99 MG/DL    BUN 9  7.0  - 18 MG/DL    Creatinine 4.40  0.6 - 1.3 MG/DL    BUN/Creatinine ratio 11 (*) 12 - 20      GFR est AA >60  >60 ml/min/1.85m2    GFR est non-AA >60  >60 ml/min/1.42m2    Calcium 8.9  8.5 - 10.1 MG/DL     Assessment:   36 yo with PMH significant only for hyperlipidemia, POD#1 s/p exploratory laparotomy and RSO for large pelvic mass (benign cystadenoma on frozen section), currently stable    Plan:   -  D/C Foley this AM, proceed with voiding trial now  - Will advance diet to clear liquids today  - Continue Fentanyl PCA, add Toradol for pain control  - Encouraged ambulation and IS      Villa Herb. Valla Leaver, MD PGY-2  [facility] Obstetrics & Gynecology  Pager: [phone]

## 2011-10-19 NOTE — Progress Notes (Signed)
Received via wc from unit,alert and oriented x3

## 2011-10-19 NOTE — Progress Notes (Signed)
Seen and agree. Continue post op care. Await bowel function.

## 2011-10-19 NOTE — Progress Notes (Signed)
Problem: Discharge Planning  Goal: *Discharge to safe environment  Home with husband. JClerveau,RN

## 2011-10-19 NOTE — Other (Signed)
Bedside and Verbal shift change report given to Jamse Arn RN (oncoming nurse) by Verdene Lennert, RN (offgoing nurse).  Report given with SBAR, Kardex, Procedure Summary, Intake/Output and MAR.

## 2011-10-19 NOTE — Progress Notes (Signed)
Patient has not voided,not uncomfortable. Report given to w. Jyl Heinz rn

## 2011-10-19 NOTE — Progress Notes (Signed)
Location: [facility] - [id number]  Attn.: MCCOLLUM, MICHAEL E  DOB: [birth year] / Age: 36  MR#: [id number] / Admit#: [id number]  Pt. First Name: Katie Mckay  Pt. Last Name: Katie Mckay [place], [place]  [address]  [address]   [facility]                        Case Management - Progress Note  Initial Open Date: 10/19/2011  Case Manager: Leana Gamer    Initial Open Date:  Social Worker:  Expected Date of Discharge:  Transferred From:  ECF Bed Held Until:  Bed Held By:  Power of Attorney:  POA/Guardian/Conservator Capacity:  Primary Caregiver:  Living Arrangements: Own Home  Source of Income:  Payee:  Psychosocial History:  Cultural/Religious/Language Issues:  Education Level:  ADLS/Current Living Arrangements Issues:  Past Providers:  Will patient perform self care at discharge? U  Anticipated Discharge Disposition Goal: Return to admission address  Assessment/Plan:      10/19/2011 10:20A In room and spoke with patient. Plan is  home with husband at discharge. Husband will transport and assist. No other  needs anticipated at this time. JClerveau,RN  Resources at Discharge:  Service Providers at Discharge:  Dictating Provider:

## 2011-10-19 NOTE — Progress Notes (Signed)
Ambulated in hallway tolerated well.

## 2011-10-19 NOTE — Progress Notes (Signed)
Patient c/o itching noted whelps on bilateral arms and upper back. Benadryl 12.5mg  IV given.

## 2011-10-19 NOTE — Other (Signed)
Bedside and Verbal shift change report given to Gale Johnakin RN (oncoming nurse) by VERA DELIA DE, RN   (offgoing nurse).  Report given with SBAR, Kardex, Intake/Output, MAR and Recent Results.

## 2011-10-20 MED ORDER — OXYCODONE-ACETAMINOPHEN 5 MG-325 MG TAB
5-325 mg | ORAL_TABLET | ORAL | Status: DC | PRN
Start: 2011-10-20 — End: 2012-01-10

## 2011-10-20 MED ADMIN — ketorolac (TORADOL) injection 30 mg: INTRAVENOUS | @ 16:00:00 | NDC 00409379501

## 2011-10-20 MED ADMIN — oxyCODONE-acetaminophen (PERCOCET) 5-325 mg per tablet 1-2 Tab: ORAL | @ 15:00:00 | NDC 00406051223

## 2011-10-20 MED ADMIN — lactated ringers infusion: INTRAVENOUS | @ 04:00:00 | NDC 00409795309

## 2011-10-20 MED ADMIN — oxyCODONE-acetaminophen (PERCOCET) 5-325 mg per tablet 1-2 Tab: ORAL | @ 20:00:00 | NDC 00406051223

## 2011-10-20 MED ADMIN — fentaNYL (PF) 10 mcg/mL PCA/infusion: INTRAVENOUS | @ 03:00:00 | NDC 99999175810

## 2011-10-20 MED ADMIN — lactated ringers infusion: INTRAVENOUS | @ 12:00:00 | NDC 00409795309

## 2011-10-20 MED ADMIN — enoxaparin (LOVENOX) injection 40 mg: SUBCUTANEOUS | @ 16:00:00 | NDC 00075062040

## 2011-10-20 MED ADMIN — fentaNYL (PF) 10 mcg/mL PCA/infusion: INTRAVENOUS | @ 12:00:00 | NDC 99999175810

## 2011-10-20 MED ADMIN — ketorolac (TORADOL) injection 30 mg: INTRAVENOUS | @ 10:00:00 | NDC 00409379501

## 2011-10-20 MED ADMIN — diphenhydrAMINE (BENADRYL) injection 12.5 mg: INTRAVENOUS | @ 03:00:00 | NDC 63323066401

## 2011-10-20 MED ADMIN — ondansetron (ZOFRAN) injection 4 mg: INTRAVENOUS | @ 18:00:00 | NDC 00781301072

## 2011-10-20 MED ADMIN — ketorolac (TORADOL) injection 30 mg: INTRAVENOUS | @ 06:00:00 | NDC 00409379501

## 2011-10-20 MED FILL — ONDANSETRON (PF) 4 MG/2 ML INJECTION: 4 mg/2 mL | INTRAMUSCULAR | Qty: 2

## 2011-10-20 MED FILL — DIPHENHYDRAMINE HCL 50 MG/ML IJ SOLN: 50 mg/mL | INTRAMUSCULAR | Qty: 1

## 2011-10-20 MED FILL — KETOROLAC TROMETHAMINE 30 MG/ML INJECTION: 30 mg/mL (1 mL) | INTRAMUSCULAR | Qty: 1

## 2011-10-20 MED FILL — OXYCODONE-ACETAMINOPHEN 5 MG-325 MG TAB: 5-325 mg | ORAL | Qty: 2

## 2011-10-20 MED FILL — PHARMACY INFORMATION NOTE: Qty: 1

## 2011-10-20 MED FILL — LOVENOX 40 MG/0.4 ML SUBCUTANEOUS SYRINGE: 40 mg/0.4 mL | SUBCUTANEOUS | Qty: 0.4

## 2011-10-20 NOTE — Progress Notes (Signed)
Progress Note    Patient: Katie Mckay MRN: [id number]  SSN: [id number]    Date of Birth: [birth year]  Age: 36 y.o.  Sex: female      Admit Date: 10/18/2011    LOS: 2 days     Subjective:   Patient reports moderately good pain control on PCA and Toradol.  She is tolerating clears, voiding and ambulating without difficulty.  She denies fevers, chills, chest pain, and SOB.    Objective:     Filed Vitals:    10/19/11 2035 10/20/11 0024 10/20/11 0322 10/20/11 0749   BP: 113/69 108/69 110/58 118/74   Pulse: 72 79 69 99   Temp: 99.6 ??F (37.6 ??C) 98.4 ??F (36.9 ??C) 98.1 ??F (36.7 ??C) 98.4 ??F (36.9 ??C)   Resp: 18 18 18 16    SpO2: 100% 100% 99% 96%      Physical Exam:   Gen: Pleasant, cooperative, NAD   Cards: RRR, 2+ pulses   Pulm: CTAB   Abd: Soft, diffusely but appropriately tender without rebound or guarding, hypoactive bowel sounds appreciated, bandage is removed and incision is clean with minimal serosanguinous drainage appreciated from the inferior aspect, no active bleeding, staples intact  Ext: No signs of DVT, SCDs are worn    Assessment:   36 yo with PMH significant only for hyperlipidemia, POD#3 s/p exploratory laparotomy and RSO for large pelvic mass (benign cystadenoma on frozen section), currently stable for discharge    Plan:    - Will D/C PCA today and transition to PO pain meds  - Advance to regular diet and, if tolerating, will D/C home this afternoon  - Continue ambulation and IS  - Follow up with Dr. Isac Sarna as scheduled      Villa Herb. Valla Leaver, MD PGY-2  [facility] Obstetrics & Gynecology  Pager: [phone]

## 2011-10-20 NOTE — Progress Notes (Signed)
pca discontinued. Up walking in hall

## 2011-10-20 NOTE — Progress Notes (Signed)
Pt D/C home.  All belongings with pt.  Pt escorted to main entrance via wheelchair.  Pt left in satisfactory condition.

## 2011-10-20 NOTE — Progress Notes (Signed)
Report given to [employer] rn

## 2011-10-20 NOTE — Other (Signed)
Bedside and Verbal shift change report given to Gale Johnakin,RN (oncoming nurse) by Lolita Ayo,RN (offgoing nurse).  Report given with SBAR, Kardex, MAR and Recent Results.

## 2011-10-20 NOTE — Progress Notes (Signed)
Patient ate lunch became nauseated. Given zofran and sios of ginger ale. States she is starting to feel better

## 2011-10-20 NOTE — Progress Notes (Signed)
Patient seen and examined independently;  Tolerating clear liquids  Advance to regular diet  Discharge home this afternoon

## 2011-10-23 NOTE — Discharge Summary (Signed)
Discharge Summary     Patient: Katie Mckay MRN: [id number]  SSN: [id number]    Date of Birth: 1975-09-28  Age: 36 y.o.  Sex: female       Admit Date: 10/18/2011    Discharge Date: 10/23/2011      Admission Diagnoses:  Complex pelvic Mass    Discharge Diagnoses: Benign right ovarian cystadenoma, soft 1-2 cm liver nodules with appearance of accessory hepatic tissue    Procedure: Exploratory laparotomy, right salpoingo-oophprectomy    Hospital Course: The patient was admitted for exploratory laparotomy secondary to a 14 cm complex cystic mass identified on CT performed 10/11/2011.  The study also revealed a 3cm soft tissue density near the liver and borderline pancreatic ductal dilation with no definite extrinsic mass.  Surgery was recommended for definitive diagnosis and to rule out malignant process.  The above procedure was performed on 10/18/2011 without complication.  Intra-operative frozen section analysis revealed benign ovarian cystadenoma.     The patient's post operative course was complicated by poor pain control post-operatively but improved with addition of IV Toradol to Fentanyl PCA.  Foley was removed on POD#1 and the patient voided without difficulty.  Diet was slowly advanced and the patient was progressively weaned from IV pain medications. By POD#3, she was tolerating a regular diet and pain was well controlled on Percocet alone.  She was discharged home in stable condition with Gyn Oncology outpatient follow up.    Consults: None    Discharge Medications:   Current Outpatient Prescriptions   Medication Sig   ??? oxyCODONE-acetaminophen (PERCOCET) 5-325 mg per tablet Take 1-2 Tabs by mouth every four (4) hours as needed.   ??? MULTIVITAMIN PO Take  by mouth.   ??? Cholecalciferol, Vitamin D3, (VITAMIN D3) 1,000 unit cap Take 2 Units by mouth daily.     ??? levonorgestrel (MIRENA) 20 mcg/24 hr IUD 1 Each by IntraUTERine route once.     ??? clonazePAM (KLONOPIN) 0.5 mg tablet Take 1 Tab by mouth nightly as  needed.   ??? ondansetron hcl (ZOFRAN) 4 mg tablet Take 1 Tab by mouth every eight (8) hours as needed for Nausea.   ??? Lancets Misc Use as directed qd-bid prn   ??? Blood-Glucose Meter monitoring kit Use as directed.       Discharge Instructions:  Pelvic rest for 6 weeks (no sex, no douching, no tampons)  No driving while taking narcotics  No heavy lifting (>10 lbs) or strenuous exercise for 6 weeks  You may shower, you may take stairs with assistance on the first try  Keep incision clean and dry, staples will be removed at the post-operative visit    Return to the ED if you experience chest pain, shortness of breath, fever > 101 degrees, profuse vaginal bleeding, or uncontrolled abdominal pain.  For all other concerns, please call the office.     Discharge Condition: Stable    Disposition: Home    Follow Up: In 2 weeks as scheduled    Signed By: Johnsie Kindred, MD     October 23, 2011

## 2011-10-29 ENCOUNTER — Encounter

## 2011-10-30 NOTE — Telephone Encounter (Signed)
From: Katie Mckay   To: Conley Canal, MD   Sent: Wynelle Link Oct 29, 2011 9:31 PM   Subject: Medication Renewal Request    Original authorizing provider: Conley Canal, MD    Katie Mckay would like a refill of the following medications:  clonazePAM (KLONOPIN) 0.5 mg tablet Conley Canal, MD]    Preferred pharmacy: [facility] on [address]. [phone]    Comment:  I've been having trouble sleeping while waiting for pathology results. During recent ER visit, large ovarian mass discovered. It has since been removed by Gynecological Oncologist (Dr. Karl Ito). Need a refill on Klonopin please.

## 2011-10-30 NOTE — Telephone Encounter (Signed)
Last refill 07/04/11    Please see patient's comment in mychart message below.

## 2011-10-31 MED ORDER — CLONAZEPAM 0.5 MG TAB
0.5 mg | ORAL_TABLET | Freq: Every evening | ORAL | Status: AC | PRN
Start: 2011-10-31 — End: ?

## 2011-10-31 NOTE — Telephone Encounter (Signed)
Rx called to pharmacy and mychart message to patient.

## 2011-12-06 HISTORY — PX: OTHER SURGICAL HISTORY: SHX169

## 2012-01-10 NOTE — Telephone Encounter (Signed)
error 

## 2012-01-10 NOTE — Progress Notes (Signed)
Chief Complaint   Patient presents with   ??? Physical     [facility]

## 2013-02-10 NOTE — Progress Notes (Signed)
Wellness Visit    Today's Date:  02/10/2013   Patient's Name: Katie Mckay   Patient's DOB:  08-06-1976     History:     Chief Complaint   Patient presents with   ??? Annual Wellness Visit     Here for employee wellness. Doesn't need pap at this time. Would like lab work done.        Katie Mckay is a 37 y.o. female presenting for a Wellness Visit      Well Woman Exam    Mammogram:  Had a mammgram a few years and had a benign lump  Papsmear Hx: Had an abnormal papsmear but the it has been   Sexual Activity/STD Exposure: denies, reports being in a monogamous relationship with her husband.   Colonscopy: none, denies history of colon disease  Daily Vitamins: yes she is taking OTC Vit D  Sun Exposure: moderate  Smoking Hx: none  Denies feelings of sadness or depression      Past Medical History   Diagnosis Date   ??? Hypercholesterolemia 2011   ??? Psychiatric disorder      anxiety--occas   ??? Hypoglycemia      Past Surgical History   Procedure Laterality Date   ??? Hx appendectomy  2009   ??? Hx cesarean section  2007; 2011   ??? Hx ovarian cyst removal     ??? Hx breast biopsy  06/2009     History     Social History   ??? Marital Status: MARRIED     Spouse Name: N/A     Number of Children: N/A   ??? 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 Smoking status: Never Smoker    ??? Smokeless tobacco: Never Used   ??? Alcohol Use: Yes      Comment: occasionally   ??? Drug Use: No   ??? Sexually Active: Yes -- Female partner(s)     Birth Control/ Protection: IUD     Other Topics Concern   ??? Not on file     Social History Narrative   ??? No narrative on file     Family History   Problem Relation Age of Onset   ??? Drug Abuse Mother    ??? Hypertension Mother    ??? Diabetes Mother    ??? Drug Abuse Father    ??? Cancer Father      Prostate   ??? Asthma Father    ??? Hypertension Father    ??? High Cholesterol Father    ??? Asthma Brother    ??? Heart Attack Paternal Grandfather      Allergies   Allergen  Reactions   ??? Stadol (Butorphanol Tartrate) Unknown (comments)       Problem List:      Patient Active Problem List   Diagnosis Code   ??? S/P unilateral salpingo-oophorectomy V45.77       Medications:     Current Outpatient Prescriptions   Medication Sig   ??? MULTIVITAMIN PO Take  by mouth.   ??? Lancets Misc Use as directed qd-bid prn   ??? Blood-Glucose Meter monitoring kit Use as directed.   ??? Cholecalciferol, Vitamin D3, (VITAMIN D3) 1,000 unit cap Take 2 Units by mouth daily.     ??? clonazePAM (KLONOPIN) 0.5 mg tablet Take 1 Tab by mouth nightly as needed.     No current facility-administered  medications for this visit.         Constitutional: negative for fevers, chills, sweats and fatigue  Respiratory: negative for cough, sputum or wheezing  Cardiovascular: negative for chest pain, chest pressure/discomfort, dyspnea  Gastrointestinal: negative for nausea, vomiting, diarrhea, constipation and abdominal pain  Musculoskeletal:negative for myalgias and arthralgias  Neurological: negative for headaches and dizziness  Behavioral/Psych: negative for anxiety and depression    Physical Assessment:   VS:  BP 117/85   Pulse 84   Temp(Src) 97.5 ??F (36.4 ??C) (Oral)   Resp 16   Ht 5\' 3"  (1.6 m)   Wt 169 lb (76.658 kg)   BMI 29.94 kg/m2   SpO2 100%    Lab Results   Component Value Date/Time    Sodium 137 10/19/2011  5:59 AM    Potassium 4.1 10/19/2011  5:59 AM    Chloride 103 10/19/2011  5:59 AM    CO2 24 10/19/2011  5:59 AM    Anion gap 10 10/19/2011  5:59 AM    Glucose 86 10/19/2011  5:59 AM    BUN 9 10/19/2011  5:59 AM    Creatinine 0.83 10/19/2011  5:59 AM    BUN/Creatinine ratio 11 10/19/2011  5:59 AM    GFR est non-AA >60 10/19/2011  5:59 AM    Calcium 8.9 10/19/2011  5:59 AM    GFR est AA >60 10/19/2011  5:59 AM       General:   Well-groomed, well-nourished, in no distress, pleasant, alert, appropriate and conversant.   Mouth:  Good dentition, oropharynx WNL without membranes, exudates, petechiae or ulcers  Neck:   Neck supple, no  swelling, mass or tenderness, no thyromegaly  Cardiovasc:   RRR, no MRG.  Pulses 2+ and symmetric at distal extremities.  Pulmonary:   Lungs clear bilaterally.  Normal respiratory effort.  Abdomen:   Abdomen soft, NT, ND, NAB.   Extremities:   No edema, no TTP bilateral calves.  LEs warm and well-perfused.  Neuro:   Alert and oriented, no focal deficits. No facial asymmetry noted.  Skin:    No rashes or jaundice  Psych:  No pressured speech or abnormal thought content        Assessment/Plan & Orders:       1. Preventative health care        Orders Placed This Encounter   ??? LIPID PANEL   ??? METABOLIC PANEL, COMPREHENSIVE   ??? VITAMIN D, 25 HYDROXY   ??? HEMOGLOBIN A1C       Wellness Visit   -will get CMP, Lipid Panel, HgbA1c and Vit D     Sees Ob/gyn in [place] had a baby 2 months ago and will follow them for her gyn exams  Follow up as needed    Westley Gambles, MD  Family Medicine  02/10/2013  10:08 AM

## 2013-02-10 NOTE — Progress Notes (Signed)
Chief Complaint   Patient presents with   ??? Annual Wellness Visit     Here for employee wellness. Doesn't need pap at this time. Would like lab work done.      PHQ 2 / 9, over the last two weeks 02/10/2013   Little interest or pleasure in doing things Not at all   Feeling down, depressed or hopeless Not at all   Total Score PHQ 2 0

## 2013-02-10 NOTE — Patient Instructions (Signed)
Eating Healthy Foods: After Your Visit  Your Care Instructions  Eating healthy foods can help lower your risk for disease. Healthy food gives you energy and keeps your heart strong, your brain active, your muscles working, and your bones strong.  A healthy diet includes a variety of foods from the basic food groups: grains, vegetables, fruits, milk and milk products, and meat and beans. Some people may eat more of their favorite foods from only one food group and, as a result, miss getting the nutrients they need. So, it is important to pay attention not only to what you eat but also to what you are missing from your diet. You can eat a healthy, balanced diet by making a few small changes.  Follow-up care is a key part of your treatment and safety. Be sure to make and go to all appointments, and call your doctor if you are having problems. It???s also a good idea to know your test results and keep a list of the medicines you take.  How can you care for yourself at home?  Look at what you eat  ?? Keep a food diary for a week or two and record everything you eat or drink. Track the number of servings you eat from each food group.  ?? For a balanced diet every day, eat a variety of:  ?? 6 or more ounce-equivalents of grains, such as cereals, breads, crackers, rice, or pasta, every day. An ounce-equivalent is 1 slice of bread, 1 cup of ready-to-eat cereal, or ?? cup of cooked rice, cooked pasta, or cooked cereal.  ?? 2?? cups of vegetables, especially:  ?? Dark-green vegetables such as broccoli and spinach.  ?? Orange vegetables such as carrots and sweet potatoes.  ?? Dry beans (such as pinto and kidney beans) and peas (such as lentils).  ?? 2 cups of fresh, frozen, or canned fruit. A small apple or 1 banana or orange equals 1 cup.  ?? 3 cups of nonfat or low-fat milk, yogurt, or other milk products.  ?? 5?? ounces of meat and beans, such as chicken, fish, lean meat, beans, nuts, and seeds. One egg, 1 tablespoon of peanut butter, ??  ounce nuts or seeds, or ?? cup of cooked beans equals 1 ounce of meat.  ?? Learn how to read food labels for serving sizes and ingredients. Fast-food and convenience-food meals often contain few or no fruits or vegetables. Make sure you eat some fruits and vegetables to make the meal more nutritious.  ?? Look at your food diary. For each food group, add up what you have eaten and then divide the total by the number of days. This will give you an idea of how much you are eating from each food group. See if you can find some ways to change your diet to make it more healthy.  Start small  ?? Do not try to make dramatic changes to your diet all at once. You might feel that you are missing out on your favorite foods and then be more likely to fail.  ?? Start slowly, and gradually change your habits. Try some of the following:  ?? Use whole wheat bread instead of white bread.  ?? Use nonfat or low-fat milk instead of whole milk.  ?? Eat brown rice instead of white rice, and eat whole wheat pasta instead of white-flour pasta.  ?? Try low-fat cheeses and low-fat yogurt.  ?? Add more fruits and vegetables to meals and have them for snacks.  ??   Add lettuce, tomato, cucumber, and onion to sandwiches.  ?? Add fruit to yogurt and cereal.  Enjoy food  ?? You can still eat your favorite foods. You just may need to eat less of them. If your favorite foods are high in fat, salt, and sugar, limit how often you eat them, but do not cut them out entirely.  ?? Eat a wide variety of foods.  Make healthy choices when eating out  ?? The type of restaurant you choose can help you make healthy choices. Even fast-food chains are now offering more low-fat or healthier choices on the menu.  ?? Choose smaller portions, or take half of your meal home.  ?? When eating out, try:  ?? A veggie pizza with a whole wheat crust or grilled chicken (instead of sausage or pepperoni).  ?? Pasta with roasted vegetables, grilled chicken, or marinara sauce instead of cream sauce.   ?? A vegetable wrap or grilled chicken wrap.  ?? Broiled or poached food instead of fried or breaded items.  Make healthy choices easy  ?? Buy packaged, prewashed, ready-to-eat fresh vegetables and fruits, such as baby carrots, salad mixes, and chopped or shredded broccoli and cauliflower.  ?? Buy packaged, presliced fruits, such as melon or pineapple.  ?? Choose 100% fruit or vegetable juice instead of soda. Limit juice intake to 4 to 6 oz (?? to ?? cup) a day.  ?? Blend low-fat yogurt, fruit juice, and canned or frozen fruit to make a smoothie for breakfast or a snack.   Where can you learn more?   Go to http://www.healthwise.net/[facility]  Enter T756 in the search box to learn more about "Eating Healthy Foods: After Your Visit."   ?? 2006-2014 Health[employer] Inc[employer]. Care instructions adapted under license by [employer] (which disclaims liability or warranty for this information). This care instruction is for use with your licensed healthcare professional. If you have questions about a medical condition or this instruction, always ask your healthcare professional. Healthwise, Incorporated disclaims any warranty or liability for your use of this information.  Content Version: 10.0.270728; Last Revised: Dec 22, 2011

## 2013-02-19 LAB — SENTARA SPECIMEN COLLN.

## 2013-07-23 LAB — AMB POC URINALYSIS DIP STICK AUTO W/O MICRO
Bilirubin (UA POC): NEGATIVE
Blood (UA POC): NEGATIVE
Glucose (UA POC): NEGATIVE
Ketones (UA POC): NEGATIVE
Leukocyte esterase (UA POC): NEGATIVE
Nitrites (UA POC): NEGATIVE
Protein (UA POC): NEGATIVE mg/dL
Specific gravity (UA POC): 1.025 (ref 1.001–1.035)
Urobilinogen (UA POC): 0.2 (ref 0.2–1)
pH (UA POC): 6.5 (ref 4.6–8.0)

## 2013-07-23 NOTE — Progress Notes (Signed)
HISTORY OF PRESENT ILLNESS  Katie Mckay is a 37 y.o. female.    Chief Complaint   Patient presents with   ??? Back Pain     pt c/o left back pain for 1 week now.       HPI: Here for complaints of left back pain that has been happening for the past week. States that the pain is "nagging" in nature and constant. States that she has been using icy-hot, heating pad, Ibuprofen, and Tylenol which helps some, however the pain is still there but better. She is unsure if any positioning makes the pain better- feels that when she sits up straighter that the pain is better. Some radiation into the left hip. Denies any N/V/D or abdominal pain. Denies urinary frequency, dysuria, or vaginal symptoms. Unsure of what is causing the pain and does not notice anything that makes the pain worse. Worried that something may be happening like the ovarian mass that she had in 2013. Wanted to come in since the pain has continued to persist.     Review of Systems   Constitutional: Negative for fever, chills and malaise/fatigue.   Gastrointestinal: Negative for nausea, vomiting, abdominal pain, diarrhea and constipation.   Genitourinary: Negative for dysuria, frequency and hematuria.   Musculoskeletal: Positive for back pain and joint pain.        Left hip pain   Neurological: Negative for tingling and sensory change.       History  Past Medical History   Diagnosis Date   ??? Hypercholesterolemia 2011   ??? Psychiatric disorder      anxiety--occas   ??? Hypoglycemia        Past Surgical History   Procedure Laterality Date   ??? Hx appendectomy  2009   ??? Hx cesarean section  2007; 2011   ??? Hx ovarian cyst removal     ??? Hx breast biopsy  06/2009       History     Social History   ??? Marital Status: MARRIED     Spouse Name: N/A     Number of Children: N/A   ??? 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 Smoking status: Never Smoker    ??? Smokeless tobacco: Never Used   ??? Alcohol Use: Yes       Comment: occasionally   ??? Drug Use: No   ??? Sexually Active: Yes -- Female partner(s)     Birth Control/ Protection: IUD     Other Topics Concern   ??? Not on file     Social History Narrative   ??? No narrative on file         Allergies   Allergen Reactions   ??? Stadol [Butorphanol Tartrate] Unknown (comments)       Current Outpatient Prescriptions   Medication Sig Dispense Refill   ??? fish oil-dha-epa 1,200-144-216 mg cap Take  by mouth.       ??? MULTIVITAMIN PO Take  by mouth.       ??? Lancets Misc Use as directed qd-bid prn  1 Package  1   ??? Blood-Glucose Meter monitoring kit Use as directed.  1 Kit  0   ??? Cholecalciferol, Vitamin D3, (VITAMIN D3) 1,000 unit cap Take 2 Units by mouth daily.         ??? clonazePAM (KLONOPIN) 0.5 mg tablet Take 1 Tab by mouth nightly  as needed.  20 Tab  0         Patient Care Team:  Patient Care Team:  Conley Canal as PCP - General (Family Practice)  Forestine Na (Obstetrics & Gynecology)  Areta Haber, MD (Obstetrics & Gynecology)      RECORDS:    Admit Date: 10/18/2011   Discharge Date: 10/23/2011   Admission Diagnoses: Complex pelvic Mass   Discharge Diagnoses: Benign right ovarian cystadenoma, soft 1-2 cm liver nodules with appearance of accessory hepatic tissue   Procedure: Exploratory laparotomy, right salpoingo-oophprectomy   Hospital Course: The patient was admitted for exploratory laparotomy secondary to a 14 cm complex cystic mass identified on CT performed 10/11/2011. The study also revealed a 3cm soft tissue density near the liver and borderline pancreatic ductal dilation with no definite extrinsic mass. Surgery was recommended for definitive diagnosis and to rule out malignant process. The above procedure was performed on 10/18/2011 without complication. Intra-operative frozen section analysis revealed benign ovarian cystadenoma.   The patient's post operative course was complicated by poor pain control post-operatively but improved with addition of IV Toradol to Fentanyl  PCA. Foley was removed on POD#1 and the patient voided without difficulty. Diet was slowly advanced and the patient was progressively weaned from IV pain medications. By POD#3, she was tolerating a regular diet and pain was well controlled on Percocet alone. She was discharged home in stable condition with Gyn Oncology outpatient follow up.        LABS:     Ref. Range 07/23/2013 15:30   Color (UA POC) No range found Yellow   Clarity (UA POC) No range found Clear   Specific gravity (UA POC) Latest Range: 1.001-1.035  1.025   pH (UA POC) Latest Range: 4.6-8.0  6.5   Protein (UA POC) Latest Range: Negative mg/dL Negative   Glucose (UA POC) Latest Range: Negative  Negative   Ketones (UA POC) Latest Range: Negative  Negative   Blood (UA POC) Latest Range: Negative  Negative   Bilirubin (UA POC) Latest Range: Negative  Negative   Urobilinogen (UA POC) Latest Range: 0.2-1  0.2 mg/dL   Nitrites (UA POC) Latest Range: Negative  Negative   Leukocyte esterase (UA POC) Latest Range: Negative  Negative       10/2011 Biopsy  UTERINE TUBE AND OVARY, RIGHT, RESECTION:  OVARY: PROLIFERATING ENDOMETRIOID TUMOR (BORDERLINE).  SIZE: 12.5 CM.  SPECIMEN INTEGRITY: INTACT.  OVARIAN SURFACE INVOLVEMENT: NOT IDENTIFIED.  UTERINE TUBE: HYDROSALPINX.  ENDOMERIOSIS.      RADIOLOGY:    IMPRESSION ABD Xray 10/2011:  Large right lower quadrant/pelvic mass suspicious for ovarian cystic neoplasm.   Recommend CT scan abdomen pelvis.   Prior retrocardiac surgery.   IUD.      IMPRESSION Transvaginal US 10/2011:   Complex cystic and solid appearing pelvic mass posterior to the uterus   measuring 14.7 x 9.3 x 11.2 cm. This cannot be clearly connected to an ovary.   Neither ovary is visualized. However this mass could represent an ovarian mass   either benign or malignant. The appearance is not typical of endometrioma.   Patient does not have an elevated white count to suggest infectious etiology   such as tubo-ovarian abscess. Clinical correlation  recommended.   Normal-size uterus with an IUD in place.   No free fluid in the pelvis    IMPRESSION CT ABD PEL 10/2011:  1. Large complex cystic mass with multiple septations in the pelvis as   described above.  The solid and vascular components is better seen on today's   ultrasound. The appearance is most likely consistent with malignant cystic   neoplasm, most likely arising from ovary. Either ovary is visible by CT. Uterus   displaced to the right with IUD present.   2. Several subtle intraparenchymal hypodense lesions are poorly visualized on   this conventional portovenous phase, suspicious for liver metastasis. This can   be better assessed by liver MR.   3. Several subtle solid masses along the superior capsule of the liver as the   new finding compared to the remote CT in 2006, highly considering   carcinomatosis. No clear CT evidence of omental caking or carcinomatosis in   other peritoneal portion.   4. New borderline pancreatic ductal dilatation with no definite extrinsic mass   or pancreatic abnormality visualized as indeterminate finding. No common bile   ductal dilatation.    Physical Exam   Constitutional: She appears well-developed and well-nourished. No distress.   Pulmonary/Chest: Effort normal and breath sounds normal. No respiratory distress.   Abdominal: Soft. Bowel sounds are normal. There is no tenderness. There is no CVA tenderness.   Musculoskeletal: She exhibits no edema.        Left hip: She exhibits normal range of motion, normal strength, no tenderness, no swelling and no crepitus.        Lumbar back: She exhibits pain. She exhibits normal range of motion, no swelling and no spasm.   Pain with palpation of left lower back   Neurological: No cranial nerve deficit. Coordination normal.   Skin: Skin is warm and dry.       BP 117/77   Pulse 72   Temp(Src) 97.9 ??F (36.6 ??C) (Oral)   Resp 20   Ht 5\' 3"  (1.6 m)   Wt 175 lb (79.379 kg)   BMI 31.01 kg/m2   SpO2 99%   LMP 07/12/2013      ASSESSMENT  and PLAN  Katie Mckay was seen today for back pain.    Diagnoses and associated orders for this visit:    Left low back pain  *Discussed with patient about possible causes. Negative U/A today. Most likely musculoskeletal- will start treatment. Due to patient's history of benign tumor, will get xray to evaluate for possible masses- patient is nervous about his. Start on muscle relaxer, NSAIDs, continue with heat and rest. Exercises reviewed. Will wait for xray and further orders to follow. She is in agreement with plan and expresses understanding.  - AMB POC URINALYSIS DIP STICK AUTO W/O MICRO  - XR SPINE LUMB 2 OR 3 V; Future  - chlorzoxazone (PARAFON FORTE) 500 mg tablet; Take 1 tablet by mouth three (3) times daily as needed for Muscle Spasm(s).  - ibuprofen (MOTRIN) 800 mg tablet; Take 1 tablet by mouth every eight (8) hours as needed for Pain.    Cystadenoma of right ovary  *History of, no new orders.    Liver nodule  *No new orders      Follow-up Disposition:  Return if symptoms worsen or fail to improve.

## 2013-07-23 NOTE — Progress Notes (Signed)
.    Chief Complaint   Patient presents with   ??? Back Pain     pt c/o left back pain for 1 week now.

## 2013-07-23 NOTE — Patient Instructions (Signed)
Learning About Relief for Back Pain  What is back tension and strain?     Back strain happens when you overstretch, or pull, a muscle in your back. You may hurt your back in an accident or when you exercise or lift something.  Most back pain will get better with rest and time. You can take care of yourself at home to help your back heal.  What can you do first to relieve back pain?  When you first feel back pain, try these steps:  ?? Walk. Take a short walk (10 to 20 minutes) on a level surface (no slopes, hills, or stairs) every 2 to 3 hours. Walk only distances you can manage without pain, especially leg pain.  ?? Relax. Find a comfortable position for rest. Some people are comfortable on the floor or a medium-firm bed with a small pillow under their head and another under their knees. Some people prefer to lie on their side with a pillow between their knees. Don't stay in one position for too long.  ?? Try heat or ice. Try using a heating pad on a low or medium setting, or take a warm shower, for 15 to 20 minutes every 2 to 3 hours. Or you can buy single-use heat wraps that last up to 8 hours. You can also try an ice pack for 10 to 15 minutes every 2 to 3 hours. You can use an ice pack or a bag of frozen vegetables wrapped in a thin towel. There is not strong evidence that either heat or ice will help, but you can try them to see if they help. You may also want to try switching between heat and cold.  ?? Take pain medicine exactly as directed.  ?? If the doctor gave you a prescription medicine for pain, take it as prescribed.  ?? If you are not taking a prescription pain medicine, ask your doctor if you can take an over-the-counter medicine.  What else can you do?  ?? Stretch and exercise. Exercises that increase flexibility may relieve your pain and make it easier for your muscles to keep your spine in a good, neutral position. And don't forget to keep walking.  ?? Do self-massage. You can use self-massage to unwind  after work or school or to energize yourself in the morning. You can easily massage your feet, hands, or neck. Self-massage works best if you are in comfortable clothes and are sitting or lying in a comfortable position. Use oil or lotion to massage bare skin.  ?? Reduce stress. Back pain can lead to a vicious circle: Distress about the pain tenses the muscles in your back, which in turn causes more pain. Learn how to relax your mind and your muscles to lower your stress.   Where can you learn more?   Go to http://www.healthwise.net/[facility]  Enter Q517 in the search box to learn more about "Learning About Relief for Back Pain."   ?? 2006-2014 Healthwise, Inc[employer]. Care instructions adapted under license by [employer] (which disclaims liability or warranty for this information). This care instruction is for use with your licensed healthcare professional. If you have questions about a medical condition or this instruction, always ask your healthcare professional. Healthwise, Inc[employer] disclaims any warranty or liability for your use of this information.  Content Version: 10.2.346038; Current as of: January 08, 2013

## 2013-07-25 ENCOUNTER — Telehealth

## 2013-07-25 NOTE — Telephone Encounter (Signed)
Chief Complaint   Patient presents with   ??? Results     Lumbar xray results returned       Called patient and discussed lumbar xray of spine. I do not think the very small osteophytes are the cause of her pain, however I do believe that this is most likely musculoskeletal pain- probably sciatica. Patient states that the pain has stayed about the same- the muscle relaxers and Ibuprofen with heat seem to help some, however not a lot. She is aware that it can take about 4 weeks to resolve low back pain. She is concerned for another tumor and would like further imaging studies. Discussed imaging studies and settled on [place] retroperitoneal and transvaginal. Will try on Medrol dosepack and await results of [place]. She is in agreement with plan and expresses understanding.    Findings:   The vertebral height, alignment and the disc spaces of the lumbar spine are   within normal limits. There is no evidence of compression fracture or   subluxation identified.   Very small vertebral body osteophytes at L2-L5.   IMPRESSION:   No acute fracture or subluxation.   Very small lumbar spine vertebral body osteophytes.

## 2013-08-06 ENCOUNTER — Telehealth

## 2013-08-06 NOTE — Telephone Encounter (Signed)
08/06/2013  12:35 PM    Chief Complaint   Patient presents with   ??? Results     Results of [place]        Called patient and spoke about the [place] retroperitoneal that was done today. Discussed results and she opted to have CT scan done for further evaluation. CT ordered. Transvaginal US pending.      ULTRASOUND RETROPERITONEAL COMPLETE   CPT CODE: 16109   INDICATIONS: Back pain   FINDINGS: Left kidney looks grossly normal but is incompletely evaluated due to   body habitus. Right kidney is normal in size and echotexture with no   hydronephrosis or stones. No focal lesions.   There is an echogenic 7.3 x 4.1 cm x 7.3 slightly hyperechoic focal mass in the   spleen   Bladder is not optimally distended but appears unremarkable. No pelvic mass   identified is identified   Right kidney 10.2 cm and left kidney 9.1 cm.   IMPRESSION:   1. No hydronephrosis. No definite kidney stones. Left kidney not optimally   evaluated as above.   2. Focal hyperechoic mass in the spleen relatively homogeneous. New since the   prior CT abdomen pelvis from 10/11/2011. Advise MR or CT for better evaluation.       08/06/2013  12:42 PM    Normal transvaginal US    Description: Selected sonographic images of the pelvis are reviewed   Clinical Indication: Back pain. History of right oophorectomy for cystadenoma.   Comparison: October 11, 2011.   Findings:   The uterus is retroverted in position and measures 7.8 x 5.2 x 5.5 cm. The   endometrial complex measures 6 mm in thickness and has a normal appearance.   The left ovary measures 2.6 x 1.8 x 2.4 cm (volume of 5.8 mL). The left ovary   has a normal appearance.   The right ovary has been surgically removed. No right adnexal masses.   Trace amount of free fluid in the cul-de-sac.   Impression:   Retroverted uterus and left ovary appear within normal limits. Small amount of   free fluid in the cul-de-sac. Prior removal of the right ovary. No adnexal   masses. No etiology for back pain.

## 2013-08-20 MED ORDER — IOVERSOL 320 MG/ML IV SOLN
320 mg iodine/mL | Freq: Once | INTRAVENOUS | Status: DC
Start: 2013-08-20 — End: 2013-08-24

## 2013-08-20 MED FILL — OPTIRAY 320 MG IODINE/ML INTRAVENOUS SOLUTION: 320 mg iodine/mL | INTRAVENOUS | Qty: 100

## 2013-08-24 LAB — CREATININE, POC
Creatinine, POC: 0.8 MG/DL (ref 0.6–1.3)
GFRAA, POC: >60 mL/min/{1.73_m2} (ref >60)
GFRNA, POC: >60 mL/min/{1.73_m2} (ref >60)

## 2013-08-24 MED ORDER — IOVERSOL 320 MG/ML IV SOLN
320 mg iodine/mL | Freq: Once | INTRAVENOUS | Status: DC
Start: 2013-08-24 — End: 2013-08-28

## 2013-08-24 MED FILL — OPTIRAY 320 MG IODINE/ML INTRAVENOUS SOLUTION: 320 mg iodine/mL | INTRAVENOUS | Qty: 100

## 2013-08-27 LAB — AMB POC RAPID INFLUENZA TEST: QuickVue Influenza test: NEGATIVE

## 2013-08-27 LAB — AMB POC URINALYSIS DIP STICK AUTO W/O MICRO
Blood (UA POC): NEGATIVE
Glucose (UA POC): NEGATIVE
Leukocyte esterase (UA POC): NEGATIVE
Nitrites (UA POC): NEGATIVE
Specific gravity (UA POC): 1.025 (ref 1.001–1.035)
Urobilinogen (UA POC): 0.2 (ref 0.2–1)
pH (UA POC): 7 (ref 4.6–8.0)

## 2013-08-27 NOTE — Progress Notes (Signed)
Chief Complaint   Patient presents with   ??? Abdominal Pain     pt state that pt abdomianl pain started yesterday around noon when pt ate lunch    ??? Vomiting     pt states that have been vomiting since this morning with diarrhea and pt feels really nausea

## 2013-08-27 NOTE — Patient Instructions (Addendum)
Nausea and Vomiting: After Your Visit  Your Care Instructions     When you are nauseated, you may feel weak and sweaty and notice a lot of saliva in your mouth. Nausea often leads to vomiting. Most of the time you do not need to worry about nausea and vomiting, but they can be signs of other illnesses.  Two common causes of nausea and vomiting are stomach flu and food poisoning. Nausea and vomiting from viral stomach flu will usually start to improve within 24 hours. Nausea and vomiting from food poisoning may last from 12 to 48 hours.  The doctor has checked you carefully, but problems can develop later. If you notice any problems or new symptoms, get medical treatment right away.  Follow-up care is a key part of your treatment and safety. Be sure to make and go to all appointments, and call your doctor if you are having problems. It's also a good idea to know your test results and keep a list of the medicines you take.  How can you care for yourself at home?  ?? To prevent dehydration, drink plenty of fluids, enough so that your urine is light yellow or clear like water. Choose water and other caffeine-free clear liquids until you feel better. If you have kidney, heart, or liver disease and have to limit fluids, talk with your doctor before you increase the amount of fluids you drink.  ?? Rest in bed until you feel better.  ?? When you are able to eat, try clear soups, mild foods, and liquids until all symptoms are gone for 12 to 48 hours. Other good choices include dry toast, crackers, cooked cereal, and gelatin dessert, such as Jell-O.  When should you call for help?  Call 911 anytime you think you may need emergency care. For example, call if:  ?? You passed out (lost consciousness).  Call your doctor now or seek immediate medical care if:  ?? You have symptoms of dehydration, such as:  ?? Dry eyes and a dry mouth.  ?? Passing only a little dark urine.  ?? Feeling thirstier than usual.  ?? You have new or worsening  belly pain.  ?? You have a new or higher fever.  ?? You vomit blood or what looks like coffee grounds.  Watch closely for changes in your health, and be sure to contact your doctor if:  ?? You have ongoing nausea and vomiting.  ?? Your vomiting is getting worse.  ?? Your vomiting lasts longer than 2 days.  ?? You are not getting better as expected.   Where can you learn more?   Go to [email or web address].huhttp://www.healthwise.net/[facility]  Enter H591 in the search box to learn more about "Nausea and Vomiting: After Your Visit."   ?? 2006-2014 Healthwise, Incorporated. Care instructions adapted under license by [employer] [place] [employer] (which disclaims liability or warranty for this information). This care instruction is for use with your licensed healthcare professional. If you have questions about a medical condition or this instruction, always ask your healthcare professional. Healthwise, Incorporated disclaims any warranty or liability for your use of this information.  Content Version: 10.2.346038; Current as of: January 08, 2013              Diarrhea: After Your Visit  Your Care Instructions     Diarrhea is loose, watery stools (bowel movements). The exact cause is often hard to find. Sometimes diarrhea is your body's way of getting rid of what caused an upset stomach. Viruses, food  poisoning, and many medicines can cause diarrhea. Some people get diarrhea in response to emotional stress, anxiety, or certain foods.  Almost everyone has diarrhea now and then. It usually isn't serious, and your stools will return to normal soon. The important thing to do is replace the fluids you have lost, so you can prevent dehydration.  The doctor has checked you carefully, but problems can develop later. If you notice any problems or new symptoms, get medical treatment right away.  Follow-up care is a key part of your treatment and safety. Be sure to make and go to all appointments, and call your doctor if you are having problems. It's also a good idea to know your  test results and keep a list of the medicines you take.  How can you care for yourself at home?  ?? Watch for signs of dehydration, which means your body has lost too much water. Dehydration is a serious condition and should be treated right away. Signs of dehydration are:  ?? Increasing thirst and dry eyes and mouth.  ?? Feeling faint or lightheaded.  ?? Darker urine, and a smaller amount of urine than normal.  ?? To prevent dehydration, drink plenty of fluids, enough so that your urine is light yellow or clear like water. Choose water and other caffeine-free clear liquids until you feel better. If you have kidney, heart, or liver disease and have to limit fluids, talk with your doctor before you increase the amount of fluids you drink.  ?? Begin eating small amounts of mild foods the next day, if you feel like it.  ?? Try yogurt that has live cultures of Lactobacillus. (Check the label.)  ?? Avoid spicy foods, fruits, alcohol, and caffeine until 48 hours after all symptoms are gone.  ?? Avoid chewing gum that contains sorbitol.  ?? Avoid dairy products (except for yogurt with Lactobacillus) while you have diarrhea and for 3 days after symptoms are gone.  ?? The doctor may recommend that you take over-the-counter medicine, such as loperamide (Imodium), if you still have diarrhea after 6 hours. Read and follow all instructions on the label. Do not use this medicine if you have bloody diarrhea, a high fever, or other signs of serious illness. Call your doctor if you think you are having a problem with your medicine.  When should you call for help?  Call 911 anytime you think you may need emergency care. For example, call if:  ?? You passed out (lost consciousness).  ?? Your stools are maroon or very bloody.  Call your doctor now or seek immediate medical care if:  ?? You are dizzy or lightheaded, or you feel like you may faint.  ?? Your stools are black and look like tar, or they have streaks of blood.  ?? You have new or worse  belly pain.  ?? You have symptoms of dehydration, such as:  ?? Dry eyes and a dry mouth.  ?? Passing only a little dark urine.  ?? Feeling thirstier than usual.  ?? You have a new or higher fever.  Watch closely for changes in your health, and be sure to contact your doctor if:  ?? Your diarrhea is getting worse.  ?? You see pus in the diarrhea.  ?? You are not getting better after 2 days (48 hours).   Where can you learn more?   Go to http://www.healthwise.net/[facility]  Enter W335 in the search box to learn more about "Diarrhea: After Your Visit."   ??   2006-2014 Healthwise, Incorporated. Care instructions adapted under license by [employer] (which disclaims liability or warranty for this information). This care instruction is for use with your licensed healthcare professional. If you have questions about a medical condition or this instruction, always ask your healthcare professional. [place] any warranty or liability for your use of this information.  Content Version: 10.2.346038; Current as of: January 08, 2013              Gastroenteritis: After Your Visit  Your Care Instructions  Gastroenteritis is an illness that may cause nausea, vomiting, and diarrhea. It is sometimes called "stomach flu." It can be caused by bacteria or a virus.  You will probably begin to feel better in 1 to 2 days. In the meantime, get plenty of rest and make sure you do not become dehydrated. Dehydration occurs when your body loses too much fluid.  Follow-up care is a key part of your treatment and safety. Be sure to make and go to all appointments, and call your doctor if you are having problems. It???s also a good idea to know your test results and keep a list of the medicines you take.  How can you care for yourself at home?  ?? If your doctor prescribed antibiotics, take them as directed. Do not stop taking them just because you feel better. You need to take the full course of antibiotics.  ?? Drink plenty of fluids to  prevent dehydration, enough so that your urine is light yellow or clear like water. Choose water and other caffeine-free clear liquids until you feel better. If you have kidney, heart, or liver disease and have to limit fluids, talk with your doctor before you increase your fluid intake.  ?? Drink fluids slowly, in frequent, small amounts, because drinking too much too fast can cause vomiting.  ?? Begin eating mild foods, such as dry toast, yogurt, applesauce, bananas, and rice. Avoid spicy, hot, or high-fat foods, and do not drink alcohol or caffeine for a day or two. Do not drink milk or eat ice cream until you are feeling better.  How to prevent gastroenteritis  ?? Keep hot foods hot and cold foods cold.  ?? Do not eat meats, dressings, salads, or other foods that have been kept at room temperature for more than 2 hours.  ?? Use a thermometer to check your refrigerator. It should be between 34??F and 40??F.  ?? Defrost meats in the refrigerator or microwave, not on the kitchen counter.  ?? Keep your hands and your kitchen clean. Wash your hands, cutting boards, and countertops with hot soapy water frequently.  ?? Cook meat until it is well done.  ?? Do not eat raw eggs or uncooked sauces made with raw eggs.  ?? Do not take chances. If food looks or tastes spoiled, throw it out.  When should you call for help?  Call 911 anytime you think you may need emergency care. For example, call if:  ?? You vomit blood or what looks like coffee grounds.  ?? You passed out (lost consciousness).  ?? You pass maroon or very bloody stools.  Call your doctor now or seek immediate medical care if:  ?? You have severe belly pain.  ?? You have signs of needing more fluids. You have sunken eyes, a dry mouth, and pass only a little dark urine.  ?? You feel like you are going to faint.  ?? You have increased belly pain that does not go  away in 1 to 2 days.  ?? You have new or increased nausea, or you are vomiting.  ?? You have a new or higher fever.  ?? Your  stools are black and tarlike or have streaks of blood.  Watch closely for changes in your health, and be sure to contact your doctor if:  ?? You are dizzy or lightheaded.  ?? You urinate less than usual, or your urine is dark yellow or brown.  ?? You do not feel better with each day that goes by.   Where can you learn more?   Go to [employer].cy  Enter N142 in the search box to learn more about "Gastroenteritis: After Your Visit."   ?? 2006-2014 Healthwise, Incorporated. Care instructions adapted under license by [employer] (which disclaims liability or warranty for this information). This care instruction is for use with your licensed healthcare professional. If you have questions about a medical condition or this instruction, always ask your healthcare professional. [place] any warranty or liability for your use of this information.  Content Version: 10.2.346038; Current as of: January 08, 2013

## 2013-08-27 NOTE — Progress Notes (Signed)
HISTORY OF PRESENT ILLNESS  Katie Mckay is a 38 y.o. female.    Chief Complaint   Patient presents with   ??? Abdominal Pain     pt state that pt abdomianl pain started yesterday around noon when pt ate lunch    ??? Vomiting     pt states that have been vomiting since this morning with diarrhea and pt feels really nausea       HPI: Patient presents to clinic today with nausea, vomiting, and abdominal pain. Since yesterday around lunchtime, she has been having nausea. She started throwing up in the evening, and diarrhea in the evening as well. Emesis x3 this morning, and once overnight; initially vomited undigested food, now bilious emesis. Unable to keep anything down, including liquids. Diarrhea at least 6 times overnight and this morning.Husband tested positive for flu 1 week ago and is taking Tamiflu; 75-monthold baby tested positive for flu on Sunday and is also taking tamiflu. Feels achy. Denies fever, chills. Denies changes to diet or any abnormal food intake. Took prevacid last night, which didn't help. Did not try any other OTC medications. Denies headaches. Reports adequate urination. Reports some periumbilical and suprapubic tenderness.     Review of Systems   Constitutional: Positive for malaise/fatigue. Negative for fever and chills.   HENT: Negative for hearing loss, congestion and sore throat.    Respiratory: Negative for cough and shortness of breath.    Cardiovascular: Negative for chest pain.   Gastrointestinal: Positive for nausea, vomiting, abdominal pain and diarrhea. Negative for heartburn.   Genitourinary: Negative for dysuria, urgency, frequency and hematuria.   Musculoskeletal: Positive for myalgias.   Neurological: Negative for dizziness and headaches.   Endo/Heme/Allergies: Negative for polydipsia.       History  Past Medical History   Diagnosis Date   ??? Hypercholesterolemia 2011   ??? Psychiatric disorder      anxiety--occas   ??? Hypoglycemia        Past Surgical History   Procedure  Laterality Date   ??? Hx appendectomy  2009   ??? Hx cesarean section  2007; 2011   ??? Hx ovarian cyst removal     ??? Hx breast biopsy  06/2009       History     Social History   ??? Marital Status: MARRIED     Spouse Name: N/A     Number of Children: N/A   ??? 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 Smoking status: Never Smoker    ??? Smokeless tobacco: Never Used   ??? Alcohol Use: Yes      Comment: occasionally   ??? Drug Use: No   ??? Sexually Active: Yes -- Female partner(s)     Birth Control/ Protection: IUD     Other Topics Concern   ??? Not on file     Social History Narrative   ??? No narrative on file       Allergies   Allergen Reactions   ??? Stadol [Butorphanol Tartrate] Unknown (comments)       Current Outpatient Prescriptions   Medication Sig Dispense Refill   ??? ibuprofen (MOTRIN) 800 mg tablet Take 1 tablet by mouth every eight (8) hours as needed for Pain.  30 tablet  0   ??? MULTIVITAMIN PO Take  by mouth.       ??? Lancets Misc Use as directed  qd-bid prn  1 Package  1   ??? Blood-Glucose Meter monitoring kit Use as directed.  1 Kit  0   ??? Cholecalciferol, Vitamin D3, (VITAMIN D3) 1,000 unit cap Take 2 Units by mouth daily.         ??? methylPREDNISolone (MEDROL DOSEPACK) 4 mg tablet Per dose pack instructions  1 Package  0   ??? fish oil-dha-epa 1,200-144-216 mg cap Take  by mouth.       ??? chlorzoxazone (PARAFON FORTE) 500 mg tablet Take 1 tablet by mouth three (3) times daily as needed for Muscle Spasm(s).  15 tablet  0   ??? clonazePAM (KLONOPIN) 0.5 mg tablet Take 1 Tab by mouth nightly as needed.  20 Tab  0         Patient Care Team:  Patient Care Team:  Quentin Angst, MD as PCP - General (Family Practice)  Birdie Riddle (Obstetrics & Gynecology)  Mayer Masker, MD (Obstetrics & Gynecology)        LABS:     Ref. Range 08/27/2013 12:40 08/27/2013 12:50   Color (UA POC) No range found Yellow    Clarity (UA POC) No range found Clear    Specific gravity (UA POC)  Latest Range: 1.001-1.035  1.025    pH (UA POC) Latest Range: 4.6-8.0  7.0    Protein (UA POC) Latest Range: Negative mg/dL Trace    Glucose (UA POC) Latest Range: Negative  Negative    Ketones (UA POC) Latest Range: Negative  3+    Blood (UA POC) Latest Range: Negative  Negative    Bilirubin (UA POC) Latest Range: Negative  1+    Urobilinogen (UA POC) Latest Range: 0.2-1  0.2 mg/dL    Nitrites (UA POC) Latest Range: Negative  Negative    Leukocyte esterase (UA POC) Latest Range: Negative  Negative    QuickVue Influenza test Latest Range: Negative   Negative       RADIOLOGY:  No new results to review.      Physical Exam   Constitutional: She appears well-developed and well-nourished.   HENT:   Head: Normocephalic.   Right Ear: Tympanic membrane, external ear and ear canal normal.   Left Ear: Tympanic membrane, external ear and ear canal normal.   Nose: Nose normal.   Mouth/Throat: Oropharynx is clear and moist.   Neck: Normal range of motion. Neck supple.   Cardiovascular: Normal rate, regular rhythm and normal heart sounds.    No murmur heard.  Pulmonary/Chest: Effort normal and breath sounds normal. No respiratory distress.   Abdominal: Soft. She exhibits no distension. There is no hepatosplenomegaly. There is tenderness in the periumbilical area and suprapubic area. There is no rebound, no guarding and no CVA tenderness.   Lymphadenopathy:     She has no cervical adenopathy.   Skin: Skin is warm and dry. No rash noted.       BP 110/73   Pulse 104   Temp(Src) 96.8 ??F (36 ??C) (Oral)   Resp 18   Ht '5\' 3"'  (1.6 m)   Wt 175 lb (79.379 kg)   BMI 31.01 kg/m2   LMP 08/10/2013      ASSESSMENT and PLAN  [name] was seen today for abdominal pain and vomiting.    Diagnoses and associated orders for this visit:    Nausea & vomiting/Gastroenteritis/Exposure to influenza  * Flu negative despite sick contacts. UA WNL. Likely gastroenteritis given constellation of symptoms. Will start zofran and educated patient  on BRAT diet and  emphasis on maintaining hydration; patient verbalized understanding and agreed with treatment plan. If not improving within several days, will send for blood work.   -     AMB POC URINALYSIS DIP STICK AUTO W/O MICRO  -     AMB POC RAPID INFLUENZA TEST  - ondansetron (ZOFRAN ODT) 4 mg disintegrating tablet; Take 1 tablet by mouth every eight (8) hours as needed for Nausea.    Periumbilic abdominal tenderness  *Urine WNL. Slightly dehydrated, likely due to n/v. Appendectomy 2009. Tenderness likely secondary to vomiting and diarrhea. Will re-assess if does not resolve with nausea/vomiting   - AMB POC URINALYSIS DIP STICK AUTO W/O MICRO      Follow-up Disposition:  Return if symptoms worsen or fail to improve.

## 2013-09-19 NOTE — Telephone Encounter (Signed)
09/18/2013  1:35 PM    Chief Complaint   Patient presents with   ??? Other       Patient called into office wondering why no one in scheduling had called her to set up the CT scan again. She did have an appointment at [facility] a while back, however they could not get the IV started that is needed to inject the contrast. The tech told her that someone would be in touch with her to reschedule, however no one has.    09/19/2013  9:12 AM    Called [facility] Sched[facility] in regards to this issue. Staff member stated that she will be in contact with the patient today to get that rescheduled.

## 2013-09-24 ENCOUNTER — Encounter

## 2013-09-25 MED ADMIN — iopamidol (ISOVUE 300) 61 % contrast injection 97 mL: INTRAVENOUS | @ 22:00:00 | NDC 00270131535

## 2013-09-25 MED FILL — ISOVUE-300  61 % INTRAVENOUS SOLUTION: 300 mg iodine /mL (61 %) | INTRAVENOUS | Qty: 97

## 2013-10-01 ENCOUNTER — Encounter

## 2013-10-01 MED ORDER — CLINDAMYCIN 1 % TOPICAL GEL
1 % | Freq: Two times a day (BID) | CUTANEOUS | Status: AC
Start: 2013-10-01 — End: ?

## 2013-10-01 MED ORDER — OXYCODONE-ACETAMINOPHEN 5 MG-325 MG TAB
5-325 mg | ORAL_TABLET | ORAL | Status: DC | PRN
Start: 2013-10-01 — End: 2014-01-01

## 2013-10-01 MED ORDER — TRIMETHOPRIM-SULFAMETHOXAZOLE 160 MG-800 MG TAB
160-800 mg | ORAL_TABLET | Freq: Two times a day (BID) | ORAL | Status: AC
Start: 2013-10-01 — End: 2013-10-11

## 2013-10-01 NOTE — Progress Notes (Signed)
Chief Complaint   Patient presents with   ??? Arm Pain     Pt here with c/o bump under arm pit x13 days

## 2013-10-02 LAB — GRAM STAIN
CULTURE RESULT: NORMAL
CULTURE RESULT: NORMAL

## 2013-10-03 LAB — WOUND CULTURE
CULTURE RESULT: ABNORMAL — AB
CULTURE RESULT: ABNORMAL — AB

## 2013-10-03 NOTE — Progress Notes (Signed)
HISTORY OF PRESENT ILLNESS  Katie Mckay is a 38 y.o. female.  HPI Comments: Patient presents with c/o under arm pain and thigh pain and swelling and erythema.  The pain has been keeping her up at night.    Arm Pain   The history is provided by the patient. This is a recurrent problem. The problem has been gradually worsening. Pain location: right axilla, right thigh. The pain is at a severity of 7/10. The pain is mild. Pertinent negatives include no numbness, full range of motion, no stiffness, no tingling, no itching, no back pain and no neck pain. She has tried nothing for the symptoms. The treatment provided no relief. There has been no history of extremity trauma.     Review of Systems   Musculoskeletal: Negative for back pain, stiffness and neck pain.   Skin: Negative for itching.   Neurological: Negative for tingling and numbness.   All other systems reviewed and are negative.    Past Medical History   Diagnosis Date   ??? Hypercholesterolemia 2011   ??? Psychiatric disorder      anxiety--occas   ??? Hypoglycemia      Past Surgical History   Procedure Laterality Date   ??? Hx appendectomy  2009   ??? Hx cesarean section  2007; 2011   ??? Hx ovarian cyst removal     ??? Hx breast biopsy  06/2009     Current Outpatient Prescriptions on File Prior to Visit   Medication Sig Dispense Refill   ??? ibuprofen (MOTRIN) 800 mg tablet Take 1 tablet by mouth every eight (8) hours as needed for Pain.  30 tablet  0   ??? MULTIVITAMIN PO Take  by mouth.       ??? Cholecalciferol, Vitamin D3, (VITAMIN D3) 1,000 unit cap Take 2 Units by mouth daily.         ??? ondansetron (ZOFRAN ODT) 4 mg disintegrating tablet Take 1 tablet by mouth every eight (8) hours as needed for Nausea.  10 tablet  0   ??? methylPREDNISolone (MEDROL DOSEPACK) 4 mg tablet Per dose pack instructions  1 Package  0   ??? fish oil-dha-epa 1,200-144-216 mg cap Take  by mouth.       ??? chlorzoxazone (PARAFON FORTE) 500 mg tablet Take 1 tablet by mouth three (3) times daily as  needed for Muscle Spasm(s).  15 tablet  0   ??? clonazePAM (KLONOPIN) 0.5 mg tablet Take 1 Tab by mouth nightly as needed.  20 Tab  0   ??? Lancets Misc Use as directed qd-bid prn  1 Package  1   ??? Blood-Glucose Meter monitoring kit Use as directed.  1 Kit  0     No current facility-administered medications on file prior to visit.     Allergies and Intolerances:   Allergies   Allergen Reactions   ??? Stadol [Butorphanol Tartrate] Unknown (comments)     Family History:   Family History   Problem Relation Age of Onset   ??? Drug Abuse Mother    ??? Hypertension Mother    ??? Diabetes Mother    ??? Drug Abuse Father    ??? Cancer Father      Prostate   ??? Asthma Father    ??? Hypertension Father    ??? High Cholesterol Father    ??? Asthma Brother    ??? Heart Attack Paternal Grandfather      Social History:   She  reports that she has  never smoked. She has never used smokeless tobacco. She  reports that she drinks alcohol.  Vitals: BP 134/78    Pulse 74    Temp(Src) 98.2 ??F (36.8 ??C) (Oral)    Resp 18    Ht '5\' 3"'  (1.6 m)    Wt 169 lb (76.658 kg)    BMI 29.94 kg/m2      SpO2 99%    LMP 09/29/2013     Body surface area is 1.85 meters squared.  BP Readings from Last 1 Encounters:   10/01/13 134/78     Wt Readings from Last 1 Encounters:   10/01/13 169 lb (76.658 kg)     CBC:   Component Value Date/Time   WBC 10.5 10/19/2011  5:59 AM   HGB 11.6 10/19/2011  5:59 AM   HCT 35.1 10/19/2011  5:59 AM   PLATELET 267 10/19/2011  5:59 AM   MCV 87.1 10/19/2011  5:59 AM     BMP:   Lab Results   Component Value Date/Time    Sodium 137 10/19/2011  5:59 AM    Potassium 4.1 10/19/2011  5:59 AM    Chloride 103 10/19/2011  5:59 AM    CO2 24 10/19/2011  5:59 AM    Anion gap 10 10/19/2011  5:59 AM    Glucose 86 10/19/2011  5:59 AM    BUN 9 10/19/2011  5:59 AM    Creatinine 0.83 10/19/2011  5:59 AM    BUN/Creatinine ratio 11 10/19/2011  5:59 AM    GFR est non-AA >60 10/19/2011  5:59 AM    Calcium 8.9 10/19/2011  5:59 AM    GFR est AA >60 10/19/2011  5:59 AM      CMP:    Lab Results    Component Value Date/Time    Sodium 137 10/19/2011  5:59 AM    Potassium 4.1 10/19/2011  5:59 AM    Chloride 103 10/19/2011  5:59 AM    CO2 24 10/19/2011  5:59 AM    Anion gap 10 10/19/2011  5:59 AM    Glucose 86 10/19/2011  5:59 AM    BUN 9 10/19/2011  5:59 AM    Creatinine 0.83 10/19/2011  5:59 AM    BUN/Creatinine ratio 11 10/19/2011  5:59 AM    GFR est AA >60 10/19/2011  5:59 AM    GFR est non-AA >60 10/19/2011  5:59 AM    Calcium 8.9 10/19/2011  5:59 AM    Bilirubin, total 0.3 10/11/2011 11:55 AM    ALT 32 10/11/2011 11:55 AM    AST 29 10/11/2011 11:55 AM    Alk. phosphatase 101 10/11/2011 11:55 AM    Protein, total 8.7 10/11/2011 11:55 AM    Albumin 4.1 10/11/2011 11:55 AM    Globulin 4.6 10/11/2011 11:55 AM    A-G Ratio 0.9 10/11/2011 11:55 AM      Coagulation:    No results found for this basename: INR, INRI, INRMR, PTMR, PTP, PT1, PT2, INRPOC   ,   No results found for this basename: APTT, PTTP     Liver:    Component Value Date/Time   ALT 32 10/11/2011 11:55 AM   AST 29 10/11/2011 11:55 AM   Alk. phosphatase 101 10/11/2011 11:55 AM   Bilirubin, total 0.3 10/11/2011 11:55 AM     H.Pylori:    No results found for this basename: HPYALT     Physical Exam   Constitutional: She is oriented to person, place, and time. Vital signs are  normal. She appears well-developed and well-nourished. She is cooperative. No distress. She is not intubated.   Cardiovascular: Normal rate, regular rhythm, normal heart sounds and intact distal pulses.  Exam reveals no gallop and no friction rub.    No murmur heard.  Pulmonary/Chest: Effort normal and breath sounds normal. No accessory muscle usage. No apnea, no tachypnea and no bradypnea. She is not intubated. No respiratory distress. She has no decreased breath sounds. She has no wheezes. She has no rhonchi. She has no rales. Chest wall is not dull to percussion. She exhibits no mass, no tenderness, no bony tenderness, no laceration, no crepitus, no edema, no deformity, no swelling and no retraction.    Neurological: She is alert and oriented to person, place, and time. Coordination normal.   Skin: Skin is dry. She is not diaphoretic.   4 cm x 1.5 cm right axilla fluctuant abscess with I and D performed in office.  Patient numbed with lidocaine.  Tolerated well.  Moderate amount of purulent drainage.  Right thigh 3 cm x 3 cm fluctuant area with induration and erythema.  Warm to touch.  Area numbed with lidocaine and I and D performed.  Patient tolerated well.  Scant purulent drainage.  Clean dressing applied to both areas.   Nursing note and vitals reviewed.    ASSESSMENT and PLAN  Amarria was seen today for arm pain.    Diagnoses and associated orders for this visit:    Wound abscess  - trimethoprim-sulfamethoxazole (BACTRIM DS, SEPTRA DS) 160-800 mg per tablet; Take 1 Tab by mouth two (2) times a day for 10 days.  - clindamycin (CLINDAGEL) 1 % topical gel; Apply  to affected area two (2) times a day. use thin film on affected area  - oxyCODONE-acetaminophen (PERCOCET) 5-325 mg per tablet; Take 1 Tab by mouth every four (4) hours as needed for Pain. Max Daily Amount: 6 Tabs.  - ANAEROBIC AND AEROBIC CULTURE; Future  - ANAEROBIC AND AEROBIC CULTURE; Future  - ANAEROBIC AND AEROBIC CULTURE      Plan of care reviewed with patient.  Understanding verbalized and they are in agreement with plan of care.

## 2013-10-05 LAB — CULTURE, ANAEROBIC: CULTURE RESULT: NORMAL

## 2013-10-07 LAB — CULTURE, ANAEROBIC: CULTURE RESULT: NORMAL

## 2014-01-01 MED ORDER — CODEINE-GUAIFENESIN 10 MG-100 MG/5 ML ORAL LIQUID
100-10 mg/5 mL | Freq: Three times a day (TID) | ORAL | Status: AC | PRN
Start: 2014-01-01 — End: ?

## 2014-01-01 MED ORDER — CETIRIZINE 10 MG TAB
10 mg | ORAL_TABLET | Freq: Every day | ORAL | Status: AC
Start: 2014-01-01 — End: ?

## 2014-01-01 MED ORDER — DEXTROMETHORPHAN POLY COMPLEX SR 30 MG/5 ML ORAL 12 HR SUSP
30 mg/5 mL | Freq: Two times a day (BID) | ORAL | Status: AC | PRN
Start: 2014-01-01 — End: ?

## 2014-01-01 MED ORDER — PSEUDOEPHEDRINE 30 MG TAB
30 mg | ORAL_TABLET | Freq: Four times a day (QID) | ORAL | Status: AC | PRN
Start: 2014-01-01 — End: ?

## 2014-01-01 MED ORDER — MOMETASONE 50 MCG/ACTUATION NASAL SPRAY
50 mcg/actuation | Freq: Every day | NASAL | Status: AC
Start: 2014-01-01 — End: ?

## 2014-01-01 NOTE — Progress Notes (Signed)
Chief Complaint   Patient presents with   ??? Nasal Congestion     Pt here for nasal cogestion, post nasal drip.and cough     Pt is a Good Health Patient. Pt here for f/u visit for nasal congestion symptom, pt states that they aren't getting better she has been the same x10 days now.

## 2014-01-01 NOTE — Patient Instructions (Signed)
Allergies: After Your Visit  Your Care Instructions  Allergies occur when your body's defense system (immune system) overreacts to certain substances. The immune system treats a harmless substance as if it were a harmful germ or virus. Many things can cause this overreaction, including pollens, medicine, food, dust, animal dander, and mold.  Allergies can be mild or severe. Mild allergies can be managed with home treatment. But medicine may be needed to prevent problems.  Managing your allergies is an important part of staying healthy. Your doctor may suggest that you have allergy testing to help find out what is causing your allergies. When you know what things trigger your symptoms, you can avoid them. This can prevent allergy symptoms and other health problems.  For severe allergies that cause reactions that affect your whole body (anaphylactic reactions), your doctor may prescribe a shot of epinephrine to carry with you in case you have a severe reaction. Learn how to give yourself the shot and keep it with you at all times. Make sure it is not expired.  Follow-up care is a key part of your treatment and safety. Be sure to make and go to all appointments, and call your doctor if you are having problems. It's also a good idea to know your test results and keep a list of the medicines you take.  How can you care for yourself at home?  ?? If you have been told by your doctor that dust or dust mites are causing your allergy, decrease the dust around your bed:  ?? Wash sheets, pillowcases, and other bedding in hot water every week.  ?? Use dust-proof covers for pillows, duvets, and mattresses. Avoid plastic covers because they tear easily and do not "breathe." Wash as instructed on the label.  ?? Do not use any blankets and pillows that you do not need.  ?? Use blankets that you can wash in your washing machine.  ?? Consider removing drapes and carpets, which attract and hold dust, from your bedroom.  ?? If you are  allergic to house dust and mites, do not use home humidifiers. Your doctor can suggest ways you can control dust and mites.  ?? Look for signs of cockroaches. Cockroaches cause allergic reactions. Use cockroach baits to get rid of them. Then, clean your home well. Cockroaches like areas where grocery bags, newspapers, empty bottles, or cardboard boxes are stored. Do not keep these inside your home, and keep trash and food containers sealed. Seal off any spots where cockroaches might enter your home.  ?? If you are allergic to mold, get rid of furniture, rugs, and drapes that smell musty. Check for mold in the bathroom.  ?? If you are allergic to outdoor pollen or mold spores, use air-conditioning. Change or clean all filters every month. Keep windows closed.  ?? If you are allergic to pollen, stay inside when pollen counts are high. Use a vacuum cleaner with a HEPA filter or a double-thickness filter at least two times each week.  ?? Stay inside when air pollution is bad. Avoid paint fumes, perfumes, and other strong odors.  ?? Avoid conditions that make your allergies worse. Stay away from smoke. Do not smoke or let anyone else smoke in your house. Do not use fireplaces or wood-burning stoves.  ?? If you are allergic to your pets, change the air filter in your furnace every month. Use high-efficiency filters.  ?? If you are allergic to pet dander, keep pets outside or out of your   bedroom. Old carpet and cloth furniture can hold a lot of animal dander. You may need to replace them.  When should you call for help?  Call 911 anytime you think you may need emergency care. For example, call if:  ?? You have symptoms of a severe allergic reaction. These may include:  ?? Sudden raised, red areas (hives) all over your body.  ?? Swelling of the throat, mouth, lips, or tongue.  ?? Trouble breathing.  ?? Passing out (losing consciousness). Or you may feel very lightheaded or suddenly feel weak, confused, or restless.  Call your doctor now  or seek immediate medical care if:  ?? You have symptoms of an allergic reaction, such as:  ?? A rash or hives (raised, red areas on the skin).  ?? Itching.  ?? Swelling.  ?? Belly pain, nausea, or vomiting.  Watch closely for changes in your health, and be sure to contact your doctor if:  ?? You do not get better as expected.   Where can you learn more?   Go to [email or web address]  Enter W171 in the search box to learn more about "Allergies: After Your Visit."   ?? 2006-2015 [employer]. Care instructions adapted under license by [employer] (which disclaims liability or warranty for this information). This care instruction is for use with your licensed healthcare professional. If you have questions about a medical condition or this instruction, always ask your healthcare professional. [place] any warranty or liability for your use of this information.  Content Version: 10.4.390249; Current as of: October 16, 2012              Cough: After Your Visit  Your Care Instructions  A cough is your body's response to something that bothers your throat or airways. Many things can cause a cough. You might cough because of a cold or the flu, bronchitis, or asthma. Smoking, postnasal drip, allergies, and stomach acid that backs up into your throat also can cause coughs.  A cough is a symptom, not a disease. Most coughs stop when the cause, such as a cold, goes away. You can take a few steps at home to cough less and feel better.  Follow-up care is a key part of your treatment and safety. Be sure to make and go to all appointments, and call your doctor if you are having problems. It's also a good idea to know your test results and keep a list of the medicines you take.  How can you care for yourself at home?  ?? Drink lots of water and other fluids. This helps thin the mucus and soothes a dry or sore throat. Honey or lemon juice in hot water or tea may ease a dry cough.  ?? Take  cough medicine as directed by your doctor.  ?? Prop up your head on pillows to help you breathe and ease a dry cough.  ?? Try cough drops to soothe a dry or sore throat. Cough drops don't stop a cough. Medicine-flavored cough drops are no better than candy-flavored drops or hard candy.  ?? Do not smoke. Avoid secondhand smoke. If you need help quitting, talk to your doctor about stop-smoking programs and medicines. These can increase your chances of quitting for good.  When should you call for help?  Call 911 anytime you think you may need emergency care. For example, call if:  ?? You have severe trouble breathing.  Call your doctor now or seek immediate medical care if:  ??  You cough up blood.  ?? You have new or worse trouble breathing.  ?? You have a new or higher fever.  ?? You have a new rash.  Watch closely for changes in your health, and be sure to contact your doctor if:  ?? You cough more deeply or more often, especially if you notice more mucus or a change in the color of your mucus.  ?? You have new symptoms, such as a sore throat, an earache, or sinus pain.  ?? You do not get better as expected.   Where can you learn more?   Go to [employer]  Enter D279 in the search box to learn more about "Cough: After Your Visit."   ?? 2006-2015 [employer]. Care instructions adapted under license by [employer] (which disclaims liability or warranty for this information). This care instruction is for use with your licensed healthcare professional. If you have questions about a medical condition or this instruction, always ask your healthcare professional. [place] any warranty or liability for your use of this information.  Content Version: 10.4.390249; Current as of: April 15, 2013              Rhinitis: After Your Visit  Your Care Instructions  Rhinitis is swelling and irritation in the nose. Allergies and infections are often the cause. Your nose may run or  feel stuffy. Other symptoms are itchy and sore eyes, ears, throat, and mouth.  If allergies are the cause, your doctor may do tests to find out what you are allergic to. You may be able to stop symptoms if you avoid the things that cause them. Your doctor may suggest or prescribe medicine to ease your symptoms.  Follow-up care is a key part of your treatment and safety. Be sure to make and go to all appointments, and call your doctor if you are having problems. It's also a good idea to know your test results and keep a list of the medicines you take.  How can you care for yourself at home?  ?? If your rhinitis is caused by allergies, try to find out what sets off (triggers) your symptoms. Take steps to avoid these triggers.  ?? Avoid yard work. It can stir up both pollen and mold.  ?? Do not smoke or allow others to smoke around you. If you need help quitting, talk to your doctor about stop-smoking programs and medicines. These can increase your chances of quitting for good.  ?? Do not use aerosol sprays, cleaning products, or perfumes.  ?? If pollen is one of your triggers, close your house and car windows during blooming season.  ?? Clean your house often to control dust.  ?? Keep pets outside.  ?? If your doctor recommends over-the-counter medicines to relieve symptoms, take your medicines exactly as prescribed. Call your doctor if you think you are having a problem with your medicine.  ?? Use saline (saltwater) nasal washes to help keep your nasal passages open and wash out mucus and bacteria. You can buy saline nose drops at a grocery store or drugstore. Or you can make your own at home by adding 1 teaspoon of salt and 1 teaspoon of baking soda to 2 cups of distilled water. If you make your own, fill a bulb syringe with the solution, insert the tip into your nostril, and squeeze gently. Blow your nose.  When should you call for help?  Call your doctor now or seek immediate medical care if:  ??  You are having trouble  breathing.  Watch closely for changes in your health, and be sure to contact your doctor if:  ?? Mucus from your nose gets thicker (like pus) or has new blood in it.  ?? You have new or worse symptoms.  ?? You do not get better as expected.   Where can you learn more?   Go to [employer]  Enter M030 in the search box to learn more about "Rhinitis: After Your Visit."   ?? 2006-2015 [employer]. Care instructions adapted under license by [employer] (which disclaims liability or warranty for this information). This care instruction is for use with your licensed healthcare professional. If you have questions about a medical condition or this instruction, always ask your healthcare professional. [place] any warranty or liability for your use of this information.  Content Version: 10.4.390249; Current as of: June 20, 2013

## 2014-01-01 NOTE — Progress Notes (Signed)
HISTORY OF PRESENT ILLNESS  Katie Mckay is a 38 y.o. female.    01/01/2014  3:56 PM    Chief Complaint   Patient presents with   ??? Nasal Congestion     Pt here for nasal cogestion, post nasal drip.and cough       HPI: Here as a [facility] patient. Here with complaints of nasal congestion with runniness that has been occuring for the past 10 days or so. She states that she also has a nonproductive cough, slightly sore throat, and chest congestion. She reports that she has tried Delsym, Zyrtec, Sudafed, and Mucinex- does seem to help some, however not much. Also has some sinus pressure and pain. Daughter currently sick with ear infection. Has not been on antibiotics in the last few months.      Review of Systems   Constitutional: Positive for malaise/fatigue. Negative for fever and chills.   HENT: Positive for congestion and sore throat. Negative for ear pain.    Eyes: Negative for pain and discharge.   Respiratory: Positive for cough. Negative for sputum production, shortness of breath and wheezing.    Cardiovascular: Negative for chest pain, palpitations and leg swelling.   Gastrointestinal: Negative for nausea, vomiting, abdominal pain and diarrhea.   Musculoskeletal: Negative for myalgias.   Neurological: Negative for dizziness and headaches.        History  Past Medical History   Diagnosis Date   ??? Hypercholesterolemia 2011   ??? Psychiatric disorder      anxiety--occas   ??? Hypoglycemia    ??? Endometrial tumor 10/2011     borderline- revealed cystadenoma   ??? Liver nodule 09/2013     likely consistent with metastic lesion- follows with oncology       Past Surgical History   Procedure Laterality Date   ??? Hx appendectomy  2009   ??? Hx cesarean section  2007; 2011   ??? Hx ovarian cyst removal     ??? Hx breast biopsy  06/2009   ??? Hx laparotomy  10/18/11     with RSO       History     Social History   ??? Marital Status: MARRIED     Spouse Name: N/A     Number of Children: N/A   ??? 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 Smoking status: Never Smoker    ??? Smokeless tobacco: Never Used   ??? Alcohol Use: Yes      Comment: occasionally   ??? Drug Use: No   ??? Sexual Activity:     Partners: Male     Patent examiner Protection: IUD     Other Topics Concern   ??? Not on file     Social History Narrative         Allergies   Allergen Reactions   ??? Stadol [Butorphanol Tartrate] Unknown (comments)       Current Outpatient Prescriptions   Medication Sig Dispense Refill   ??? pseudoephedrine (SUDAFED) 30 mg tablet Take  by mouth every four (4) hours as needed for Congestion.       ??? dextromethorphan (DELSYM) 30 mg/5 mL liquid Take 60 mg by mouth two (2) times a day.       ??? cetirizine (ZYRTEC) 10 mg tablet Take  by mouth daily.       ??? ibuprofen (MOTRIN) 800 mg tablet  Take 1 tablet by mouth every eight (8) hours as needed for Pain.  30 tablet  0   ??? MULTIVITAMIN PO Take  by mouth.       ??? Lancets Misc Use as directed qd-bid prn  1 Package  1   ??? Blood-Glucose Meter monitoring kit Use as directed.  1 Kit  0   ??? Cholecalciferol, Vitamin D3, (VITAMIN D3) 1,000 unit cap Take 2 Units by mouth daily.         ??? clindamycin (CLINDAGEL) 1 % topical gel Apply  to affected area two (2) times a day. use thin film on affected area  1 Tube  0   ??? oxyCODONE-acetaminophen (PERCOCET) 5-325 mg per tablet Take 1 Tab by mouth every four (4) hours as needed for Pain. Max Daily Amount: 6 Tabs.  30 Tab  0   ??? ondansetron (ZOFRAN ODT) 4 mg disintegrating tablet Take 1 tablet by mouth every eight (8) hours as needed for Nausea.  10 tablet  0   ??? methylPREDNISolone (MEDROL DOSEPACK) 4 mg tablet Per dose pack instructions  1 Package  0   ??? fish oil-dha-epa 1,200-144-216 mg cap Take  by mouth.       ??? chlorzoxazone (PARAFON FORTE) 500 mg tablet Take 1 tablet by mouth three (3) times daily as needed for Muscle Spasm(s).  15 tablet  0   ??? clonazePAM (KLONOPIN) 0.5 mg tablet Take 1 Tab by mouth nightly as needed.  20 Tab  0          Advance Care Planning:   Patient was offered the opportunity to discuss advance care planning NO   Does patient have an Advance Directive:     If no, did you provide information on [facility]?         Patient Care Team:  Patient Care Team:  Quentin Angst, MD as PCP - General (Family Practice)  Birdie Riddle, MD (Obstetrics & Gynecology)  Mayer Masker, MD (Oncology)        LABS:  None new to review    RADIOLOGY:  None new to review      Physical Exam   Constitutional: She appears well-developed and well-nourished. No distress.   HENT:   Head: Normocephalic.   Right Ear: External ear and ear canal normal. Tympanic membrane is not injected and not erythematous. A middle ear effusion is present.   Left Ear: External ear and ear canal normal. Tympanic membrane is not injected and not erythematous. A middle ear effusion is present.   Nose: Mucosal edema and rhinorrhea present. Right sinus exhibits no maxillary sinus tenderness and no frontal sinus tenderness. Left sinus exhibits no maxillary sinus tenderness and no frontal sinus tenderness.   Mouth/Throat: Uvula is midline, oropharynx is clear and moist and mucous membranes are normal.   Eyes: Conjunctivae are normal. Pupils are equal, round, and reactive to light.   Neck: Normal range of motion. Neck supple.   Cardiovascular: Normal rate, regular rhythm and normal heart sounds.    No murmur heard.  Pulmonary/Chest: Effort normal and breath sounds normal. No respiratory distress.   Abdominal: Soft. Bowel sounds are normal. There is no tenderness.   Lymphadenopathy:     She has no cervical adenopathy.   Skin: Skin is warm and dry.        BP 117/79   Pulse 86   Temp(Src) 98.4 ??F (36.9 ??C) (Oral)   Resp 18   Ht '5\' 3"'  (1.6 m)  Wt 164 lb (74.39 kg)   BMI 29.06 kg/m2   SpO2 98%   LMP 12/24/2013      ASSESSMENT and PLAN  Katie Mckay was seen today for nasal congestion.    Diagnoses and associated orders for this visit:    Allergic rhinitis, unspecified  allergic rhinitis type  *Discussed with patient that I feel most of her symptoms are allergy related. No signs of infection noted. Recommended to continue on Zyrtec daily, use Sudafed PRN, and cough medication PRN. Start on steroid nasal spray. If not improving in about 5-7 more days, call or RTO. Discussed symptoms to report.  - pseudoephedrine (SUDAFED) 30 mg tablet; Take 1 Tab by mouth every six (6) hours as needed for Congestion.  - cetirizine (ZYRTEC) 10 mg tablet; Take 1 Tab by mouth daily.  - dextromethorphan (DELSYM) 30 mg/5 mL liquid; Take 10 mL by mouth two (2) times daily as needed for Cough.  - mometasone (NASONEX) 50 mcg/actuation nasal spray; 2 Sprays by Both Nostrils route daily.    Middle ear effusion, bilateral  *No signs of infection. Antihistamine and PRN sudafed.    Cough  - guaiFENesin-codeine (GUAIFENESIN AC) 10-100 mg/5 mL solution; Take 5 mL by mouth three (3) times daily as needed for Cough or Congestion. Max Daily Amount: 15 mL.    *Plan of care reviewed with patient. Patient in agreement with plan and expresses understanding. All questions answered and patient encouraged to call or RTO if further questions or concerns.    *Ms. Hollar has a reminder for a "due or due soon" health maintenance. I have asked that she contact her primary care provider or make a well visit with me to follow-up on this health maintenance.        Follow-up Disposition:  Return if symptoms worsen or fail to improve.

## 2014-01-03 LAB — AMB EXT HGBA1C: Hemoglobin A1c, External: 5.4

## 2014-01-03 LAB — AMB EXT CREATININE: Creatinine, External: 0.79

## 2014-01-03 LAB — AMB EXT LDL-C: LDL-C, External: 135

## 2014-04-09 LAB — AMB POC URINALYSIS DIP STICK AUTO W/ MICRO
Bilirubin (UA POC): NEGATIVE
Blood (UA POC): NEGATIVE
Glucose (UA POC): NEGATIVE
Ketones (UA POC): NEGATIVE
Nitrites (UA POC): NEGATIVE
Protein (UA POC): NEGATIVE mg/dL
Specific gravity (UA POC): 1.02 (ref 1.001–1.035)
Urobilinogen (UA POC): 0.2 (ref 0.2–1)
pH (UA POC): 6.5 (ref 4.6–8.0)

## 2014-04-09 MED ORDER — CIPROFLOXACIN 250 MG TAB
250 mg | ORAL_TABLET | Freq: Two times a day (BID) | ORAL | Status: AC
Start: 2014-04-09 — End: 2014-04-12

## 2014-04-09 NOTE — Patient Instructions (Signed)
Eating Healthy Foods: After Your Visit  Your Care Instructions  Eating healthy foods can help lower your risk for disease. Healthy food gives you energy and keeps your heart strong, your brain active, your muscles working, and your bones strong.  A healthy diet includes a variety of foods from the basic food groups: grains, vegetables, fruits, milk and milk products, and meat and beans. Some people may eat more of their favorite foods from only one food group and, as a result, miss getting the nutrients they need. So, it is important to pay attention not only to what you eat but also to what you are missing from your diet. You can eat a healthy, balanced diet by making a few small changes.  Follow-up care is a key part of your treatment and safety. Be sure to make and go to all appointments, and call your doctor if you are having problems. It???s also a good idea to know your test results and keep a list of the medicines you take.  How can you care for yourself at home?  Look at what you eat  ?? Keep a food diary for a week or two and record everything you eat or drink. Track the number of servings you eat from each food group.  ?? For a balanced diet every day, eat a variety of:  ?? 6 or more ounce-equivalents of grains, such as cereals, breads, crackers, rice, or pasta, every day. An ounce-equivalent is 1 slice of bread, 1 cup of ready-to-eat cereal, or ?? cup of cooked rice, cooked pasta, or cooked cereal.  ?? 2?? cups of vegetables, especially:  ?? Dark-green vegetables such as broccoli and spinach.  ?? Orange vegetables such as carrots and sweet potatoes.  ?? Dry beans (such as pinto and kidney beans) and peas (such as lentils).  ?? 2 cups of fresh, frozen, or canned fruit. A small apple or 1 banana or orange equals 1 cup.  ?? 3 cups of nonfat or low-fat milk, yogurt, or other milk products.  ?? 5?? ounces of meat and beans, such as chicken, fish, lean meat, beans,  nuts, and seeds. One egg, 1 tablespoon of peanut butter, ?? ounce nuts or seeds, or ?? cup of cooked beans equals 1 ounce of meat.  ?? Learn how to read food labels for serving sizes and ingredients. Fast-food and convenience-food meals often contain few or no fruits or vegetables. Make sure you eat some fruits and vegetables to make the meal more nutritious.  ?? Look at your food diary. For each food group, add up what you have eaten and then divide the total by the number of days. This will give you an idea of how much you are eating from each food group. See if you can find some ways to change your diet to make it more healthy.  Start small  ?? Do not try to make dramatic changes to your diet all at once. You might feel that you are missing out on your favorite foods and then be more likely to fail.  ?? Start slowly, and gradually change your habits. Try some of the following:  ?? Use whole wheat bread instead of white bread.  ?? Use nonfat or low-fat milk instead of whole milk.  ?? Eat brown rice instead of white rice, and eat whole wheat pasta instead of white-flour pasta.  ?? Try low-fat cheeses and low-fat yogurt.  ?? Add more fruits and vegetables to meals and have them for snacks.  ??   Add lettuce, tomato, cucumber, and onion to sandwiches.  ?? Add fruit to yogurt and cereal.  Enjoy food  ?? You can still eat your favorite foods. You just may need to eat less of them. If your favorite foods are high in fat, salt, and sugar, limit how often you eat them, but do not cut them out entirely.  ?? Eat a wide variety of foods.  Make healthy choices when eating out  ?? The type of restaurant you choose can help you make healthy choices. Even fast-food chains are now offering more low-fat or healthier choices on the menu.  ?? Choose smaller portions, or take half of your meal home.  ?? When eating out, try:  ?? A veggie pizza with a whole wheat crust or grilled chicken (instead of sausage or pepperoni).   ?? Pasta with roasted vegetables, grilled chicken, or marinara sauce instead of cream sauce.  ?? A vegetable wrap or grilled chicken wrap.  ?? Broiled or poached food instead of fried or breaded items.  Make healthy choices easy  ?? Buy packaged, prewashed, ready-to-eat fresh vegetables and fruits, such as baby carrots, salad mixes, and chopped or shredded broccoli and cauliflower.  ?? Buy packaged, presliced fruits, such as melon or pineapple.  ?? Choose 100% fruit or vegetable juice instead of soda. Limit juice intake to 4 to 6 oz (?? to ?? cup) a day.  ?? Blend low-fat yogurt, fruit juice, and canned or frozen fruit to make a smoothie for breakfast or a snack.   Where can you learn more?   Go to http://www.healthwise.net/[facility]  Enter T756 in the search box to learn more about "Eating Healthy Foods: After Your Visit."   ?? 2006-2015 Healthwise, Inc[employer]. Care instructions adapted under license by [employer] (which disclaims liability or warranty for this information). This care instruction is for use with your licensed healthcare professional. If you have questions about a medical condition or this instruction, always ask your healthcare professional. Healthwise, Incorporated disclaims any warranty or liability for your use of this information.  Content Version: 10.5.422740; Current as of: June 20, 2013

## 2014-04-09 NOTE — Progress Notes (Signed)
Chief Complaint   Patient presents with   ??? Complete Physical     pt presents for physical with pap    ??? Abdominal Pain     pt states that pt has been having abdominal pain pt states that pt has had this for a while but it has got worst    ??? Back Pain     pt presents for back pain

## 2014-04-09 NOTE — Progress Notes (Signed)
Well Woman Visit    Today's Date:  04/09/2014   Patient's Name: Katie Mckay   Patient's DOB:  1975/10/25     History:     Chief Complaint   Patient presents with   ??? Complete Physical     pt presents for physical with pap    ??? Abdominal Pain     pt states that pt has been having abdominal pain pt states that pt has had this for a while but it has got worst    ??? Back Pain     pt presents for back pain        Katie Mckay is a 38 y.o. female presenting for Well Woman      Well Woman Exam    Mammogram:  Hx of lump which was removed and was benign  Papsmear Hx: normal in the past  Is in a mongamous relationship w/ her husband  Menses: regular monthly periods denies excessive cramping or bleeding.  Last about 3-4 days.   Colonscopy: none, denies history of colon disease  Daily Vitamins: none  Sun Exposure: moderate  Smoking Hx: denies  Denies feelings of sadness or depression    Abdominal Cramping    Onset; 5 days ago  Reports that she has noticed mild cramping w/ back pain  Denies dysuria or hematuria  Denies precipitating or alleviating factor  Denies vaginal discharge   Reports dysparenia     Past Medical History   Diagnosis Date   ??? Hypercholesterolemia 2011   ??? Psychiatric disorder      anxiety--occas   ??? Hypoglycemia    ??? Endometrial tumor 10/2011     borderline- revealed cystadenoma   ??? Liver nodule 09/2013     likely consistent with metastic lesion- follows with oncology     Past Surgical History   Procedure Laterality Date   ??? Hx appendectomy  2009   ??? Hx cesarean section  2007; 2011   ??? Hx ovarian cyst removal     ??? Hx breast biopsy  06/2009   ??? Hx laparotomy  10/18/11     with RSO     History     Social History   ??? Marital Status: MARRIED     Spouse Name: N/A     Number of Children: N/A   ??? 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 Smoking status: Never Smoker    ??? Smokeless tobacco: Never Used   ??? Alcohol Use: Yes      Comment: occasionally    ??? Drug Use: No   ??? Sexual Activity:     Partners: Male     Patent examiner Protection: IUD     Other Topics Concern   ??? Not on file     Social History Narrative     Family History   Problem Relation Age of Onset   ??? Drug Abuse Mother    ??? Hypertension Mother    ??? Diabetes Mother    ??? Drug Abuse Father    ??? Cancer Father      Prostate   ??? Asthma Father    ??? Hypertension Father    ??? High Cholesterol Father    ??? Asthma Brother    ??? Heart Attack Paternal Grandfather      Allergies   Allergen Reactions   ??? Stadol [Butorphanol Tartrate] Unknown (comments)  Problem List:      Patient Active Problem List   Diagnosis Code   ??? S/P unilateral salpingo-oophorectomy V45.77       Medications:     Current Outpatient Prescriptions   Medication Sig   ??? ciprofloxacin HCl (CIPRO) 250 mg tablet Take 1 Tab by mouth every twelve (12) hours for 3 days.   ??? pseudoephedrine (SUDAFED) 30 mg tablet Take 1 Tab by mouth every six (6) hours as needed for Congestion.   ??? cetirizine (ZYRTEC) 10 mg tablet Take 1 Tab by mouth daily.   ??? mometasone (NASONEX) 50 mcg/actuation nasal spray 2 Sprays by Both Nostrils route daily.   ??? clindamycin (CLINDAGEL) 1 % topical gel Apply  to affected area two (2) times a day. use thin film on affected area   ??? ibuprofen (MOTRIN) 800 mg tablet Take 1 tablet by mouth every eight (8) hours as needed for Pain.   ??? clonazePAM (KLONOPIN) 0.5 mg tablet Take 1 Tab by mouth nightly as needed.   ??? MULTIVITAMIN PO Take  by mouth.   ??? Lancets Misc Use as directed qd-bid prn   ??? Blood-Glucose Meter monitoring kit Use as directed.   ??? Cholecalciferol, Vitamin D3, (VITAMIN D3) 1,000 unit cap Take 2 Units by mouth daily.     ??? dextromethorphan (DELSYM) 30 mg/5 mL liquid Take 10 mL by mouth two (2) times daily as needed for Cough.   ??? guaiFENesin-codeine (GUAIFENESIN AC) 10-100 mg/5 mL solution Take 5 mL by mouth three (3) times daily as needed for Cough or Congestion. Max Daily Amount: 15 mL.    ??? ondansetron (ZOFRAN ODT) 4 mg disintegrating tablet Take 1 tablet by mouth every eight (8) hours as needed for Nausea.   ??? methylPREDNISolone (MEDROL DOSEPACK) 4 mg tablet Per dose pack instructions   ??? fish oil-dha-epa 1,200-144-216 mg cap Take  by mouth.   ??? chlorzoxazone (PARAFON FORTE) 500 mg tablet Take 1 tablet by mouth three (3) times daily as needed for Muscle Spasm(s).     No current facility-administered medications for this visit.         Constitutional: negative for fevers, chills, sweats and fatigue  Respiratory: negative for wheezing  Cardiovascular: negative for chest pain, chest pressure/discomfort, dyspnea  Gastrointestinal: positive for abdominal pain, negative for nausea, vomiting, diarrhea and constipation  Musculoskeletal:positive for back pain, negative for myalgias and arthralgias  Neurological: negative for headaches and dizziness  Behavioral/Psych: negative for anxiety and depression    Physical Assessment:   VS:  BP 117/79 mmHg   Pulse 90   Temp(Src) 98.3 ??F (36.8 ??C) (Oral)   Resp 18   Ht '5\' 3"'  (1.6 m)   Wt 168 lb (76.204 kg)   BMI 29.77 kg/m2   SpO2 100%   LMP 03/18/2014    Lab Results   Component Value Date/Time    SODIUM 137 10/19/2011 05:59 AM    POTASSIUM 4.1 10/19/2011 05:59 AM    CHLORIDE 103 10/19/2011 05:59 AM    CO2 24 10/19/2011 05:59 AM    ANION GAP 10 10/19/2011 05:59 AM    GLUCOSE 86 10/19/2011 05:59 AM    BUN 9 10/19/2011 05:59 AM    CREATININE 0.83 10/19/2011 05:59 AM    BUN/CREATININE RATIO 11 10/19/2011 05:59 AM    GFR EST AA >60 10/19/2011 05:59 AM    GFR EST NON-AA >60 10/19/2011 05:59 AM    CALCIUM 8.9 10/19/2011 05:59 AM       General:  Well-groomed, well-nourished, in no distress, pleasant, alert, appropriate and conversant.   Mouth:  Good dentition, oropharynx WNL without membranes, exudates, petechiae or ulcers  Breast:  No breast tenderness, mass or nipple discharge  Cardiovasc:   RRR, no MRG.  Pulses 2+ and symmetric at distal extremities.   Pulmonary:   Lungs clear bilaterally.  Normal respiratory effort.  Abdomen:   Abdomen soft, NT, ND, NAB.    GU:   papsmear sample taken, normal physiological discharge present, no cervical tenderness, or uterine enlargement  Extremities:   No edema, no TTP bilateral calves.  LEs warm and well-perfused.  Neuro:   Alert and oriented,  no focal deficits. No facial asymmetry noted.  Skin:    No rashes or jaundice  MSK:   Normal ROM, 5/5 muscle strength  Psych:  No pressured speech or abnormal thought content        Assessment/Plan & Orders:       1. Routine gynecological examination    2. Generalized abdominal pain    3. UTI (lower urinary tract infection)        Orders Placed This Encounter   ??? NUSWAB VAGINITIS PLUS   ??? AMB POC URINALYSIS DIP STICK AUTO W/ MICRO   ??? ciprofloxacin HCl (CIPRO) 250 mg tablet   ??? PAP IG, APTIMA HPV AND RFX 16/18,45 ([id number])       Well Woman W/ papsmear    Abdominal Cramping w/ positive UTI will send in Cipro  -nuswab pending    Follow up in 2-3 months    Quentin Angst, MD  Family Medicine  04/09/2014  2:59 PM

## 2014-04-10 ENCOUNTER — Encounter

## 2014-04-10 LAB — PAP IG (IMAGE GUIDED)

## 2014-04-10 MED ORDER — FLUCONAZOLE 200 MG TAB
200 mg | ORAL_TABLET | Freq: Every day | ORAL | Status: AC
Start: 2014-04-10 — End: 2014-04-11

## 2014-04-14 NOTE — Addendum Note (Signed)
Addended by: Selena Lesser on: 04/14/2014 09:20 AM      Modules accepted: Level of Service

## 2014-04-15 LAB — NUSWAB VAGINITIS PLUS
C. albicans, NAA: POSITIVE — AB
C. glabrata, NAA: NEGATIVE
C. trachomatis, NAA: NEGATIVE
N. gonorrhoeae, NAA: NEGATIVE
T. vaginalis by NAA: NEGATIVE

## 2014-04-16 LAB — HPV HIGH RISK, THIN PREP
HPV DNA Probe, High Risk: NEGATIVE
HPV, High Risk: NEGATIVE

## 2014-04-16 NOTE — Telephone Encounter (Signed)
04/16/2014  11:52 AM    Chief Complaint   Patient presents with   ??? Results       Called and spoke with patient about Nuswab results done during well woman on 9/3 by Dr Jeneen Rinks. I am unable to see if she was treated for positive candida. Dr Jeneen Rinks currently out of office. She states that she did receive some Diflucan and symptoms have resolved. No further treatment needed.

## 2014-04-23 ENCOUNTER — Encounter

## 2014-04-23 MED ORDER — FLUCONAZOLE 200 MG TAB
200 mg | ORAL_TABLET | Freq: Every day | ORAL | Status: AC
Start: 2014-04-23 — End: 2014-04-24

## 2014-07-10 ENCOUNTER — Encounter: Attending: Family Medicine | Primary: Family Medicine

## 2014-08-14 ENCOUNTER — Encounter

## 2014-08-17 ENCOUNTER — Inpatient Hospital Stay: Admit: 2014-08-17 | Payer: PRIVATE HEALTH INSURANCE | Attending: Gynecologic Oncology | Primary: Family Medicine

## 2014-08-17 ENCOUNTER — Ambulatory Visit: Payer: PRIVATE HEALTH INSURANCE | Primary: Family Medicine

## 2014-08-17 DIAGNOSIS — R160 Hepatomegaly, not elsewhere classified: Secondary | ICD-10-CM

## 2014-08-17 MED ORDER — IOPAMIDOL 61 % IV SOLN
300 mg iodine /mL (61 %) | Freq: Once | INTRAVENOUS | Status: AC
Start: 2014-08-17 — End: 2014-08-17
  Administered 2014-08-17: 22:00:00 via INTRAVENOUS

## 2014-08-17 MED FILL — ISOVUE-300  61 % INTRAVENOUS SOLUTION: 300 mg iodine /mL (61 %) | INTRAVENOUS | Qty: 100

## 2014-10-20 LAB — HM PAP SMEAR

## 2014-10-21 ENCOUNTER — Encounter

## 2014-11-30 ENCOUNTER — Inpatient Hospital Stay: Admit: 2014-11-30 | Payer: PRIVATE HEALTH INSURANCE | Attending: Gynecologic Oncology | Primary: Family Medicine

## 2014-11-30 DIAGNOSIS — C561 Malignant neoplasm of right ovary: Secondary | ICD-10-CM

## 2014-11-30 LAB — CREATININE, POC
Creatinine, POC: 0.8 MG/DL (ref 0.6–1.3)
GFRAA, POC: >60 mL/min/{1.73_m2} (ref >60)
GFRNA, POC: >60 mL/min/{1.73_m2} (ref >60)

## 2014-11-30 MED ORDER — IOPAMIDOL 61 % IV SOLN
300 mg iodine /mL (61 %) | Freq: Once | INTRAVENOUS | Status: AC
Start: 2014-11-30 — End: 2014-11-30
  Administered 2014-11-30: 13:00:00 via INTRAVENOUS

## 2014-11-30 MED FILL — ISOVUE-300  61 % INTRAVENOUS SOLUTION: 300 mg iodine /mL (61 %) | INTRAVENOUS | Qty: 100

## 2015-05-13 ENCOUNTER — Other Ambulatory Visit: Payer: Self-pay | Admitting: Family Medicine

## 2015-05-13 DIAGNOSIS — Z1231 Encounter for screening mammogram for malignant neoplasm of breast: Secondary | ICD-10-CM

## 2015-08-03 ENCOUNTER — Ambulatory Visit
Admission: RE | Admit: 2015-08-03 | Discharge: 2015-08-03 | Disposition: A | Discharge status: Home / Self Care | Payer: 59 | Source: Ambulatory Visit | Attending: Family Medicine | Admitting: Family Medicine

## 2015-08-03 DIAGNOSIS — Z1231 Encounter for screening mammogram for malignant neoplasm of breast: Secondary | ICD-10-CM

## 2015-08-26 DIAGNOSIS — H5213 Myopia, bilateral: Secondary | ICD-10-CM | POA: Diagnosis not present

## 2015-09-21 MED FILL — PREMARIN 0.3 MG TABLET: 0.3 | 90 days supply | Qty: 90 | Fill #0

## 2016-02-10 ENCOUNTER — Encounter: Payer: Self-pay | Admitting: Obstetrics & Gynecology

## 2016-02-10 ENCOUNTER — Ambulatory Visit (INDEPENDENT_AMBULATORY_CARE_PROVIDER_SITE_OTHER): Payer: 59 | Admitting: Obstetrics & Gynecology

## 2016-02-10 VITALS — BP 119/72 | HR 93 | Resp 16 | Ht 62.0 in | Wt 178.0 lb

## 2016-02-10 DIAGNOSIS — Z01419 Encounter for gynecological examination (general) (routine) without abnormal findings: Secondary | ICD-10-CM

## 2016-02-10 DIAGNOSIS — D3910 Neoplasm of uncertain behavior of unspecified ovary: Secondary | ICD-10-CM | POA: Diagnosis not present

## 2016-02-10 DIAGNOSIS — Z8342 Family history of familial hypercholesterolemia: Secondary | ICD-10-CM | POA: Diagnosis not present

## 2016-02-10 NOTE — Progress Notes (Signed)
Patient ID: Kimberly Richards, female   DOB: Sep 20, 1975, 40 y.o.   MRN: [id number] Subjective:     Kimberly Richards is a 40 y.o. female here for a routine exam.  Current complaints: none.  Pt reports a h/o a Borderline ovarian mass that was removed 2013. No chemo or radiation.  Moved here 1 year prev. Pt had a TAH/ USO after recurrent mass was noted 12/2014. Pt on HRT. Pt c/o hot flushes and decreased libido.  She also reports vaginal dryness.     Gynecologic History No LMP recorded. Patient has had a hysterectomy. Contraception: status post hysterectomy Last Pap: 1 year prev. Results were: normal ( was done in [place], [place])- h/o abnormal PAP 'years ago'  Last mammogram: 08/03/2015. Results were: normal  Obstetric History OB History  No data available   The following portions of the patient's history were reviewed and updated as appropriate: allergies, current medications, past family history, past medical history, past social history, past surgical history and problem list.  Review of Systems Pertinent items are noted in HPI.    Objective:  BP 119/72 mmHg  Pulse 93  Resp 16  Ht 5\' 2"  (1.575 m)  Wt 178 lb (80.74 kg)  BMI 32.55 kg/m2 General Appearance:    Alert, cooperative, no distress, appears stated age  Head:    Normocephalic, without obvious abnormality, atraumatic  Eyes:    conjunctiva/corneas clear, EOM's intact, both eyes  Ears:    Normal external ear canals, both ears  Nose:   Nares normal, septum midline, mucosa normal, no drainage    or sinus tenderness  Throat:   Lips, mucosa, and tongue normal; teeth and gums normal  Neck:   Supple, symmetrical, trachea midline, no adenopathy;    thyroid:  no enlargement/tenderness/nodules  Back:     Symmetric, no curvature, ROM normal, no CVA tenderness  Lungs:     Clear to auscultation bilaterally, respirations unlabored  Chest Wall:    No tenderness or deformity   Heart:    Regular rate and rhythm, S1 and S2 normal, no  murmur, rub   or gallop  Breast Exam:    No tenderness, masses, or nipple abnormality  Abdomen:     Soft, non-tender, bowel sounds active all four quadrants,    no masses, no organomegaly  Genitalia:    Normal female without lesion, discharge or tenderness; cervix surgically absent     Extremities:   Extremities normal, atraumatic, no cyanosis or edema  Pulses:   2+ and symmetric all extremities  Skin:   Skin color, texture, turgor normal, no rashes or lesions    Assessment:    Healthy female exam.   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 Ovarian tumor of borderline malignancy, unspecified laterality  Need records from Dr. Randalyn Rhea- GYN ONC and her FP for her last PAP CA125 q 6 months Imaging annually  May change according to records form GYN ONC  - CA 125 - Comprehensive metabolic panel - CT ABDOMEN PELVIS W WO CONTRAST; Future  2. Visit for gynecologic examination  - CA 125 - Comprehensive metabolic panel - TSH - HgB A1c  3. Family history of high cholesterol Pt is nonfasting.   - Lipid panel  4. Symptomatic surgical menopause.   Keep EES at .3mg  for now until [facility] are [facility]. Will send pt info on vaginal lubrication  Cade Olberding L. Harraway-Smith, M.D., Cherlynn June

## 2016-02-11 ENCOUNTER — Telehealth: Payer: Self-pay | Admitting: *Deleted

## 2016-02-11 LAB — COMPREHENSIVE METABOLIC PANEL
ALBUMIN: 4 g/dL (ref 3.6–5.1)
ALT: 15 U/L (ref 6–29)
AST: 17 U/L (ref 10–30)
Alkaline Phosphatase: 67 U/L (ref 33–115)
BILIRUBIN TOTAL: 0.3 mg/dL (ref 0.2–1.2)
BUN: 16 mg/dL (ref 7–25)
CALCIUM: 9.7 mg/dL (ref 8.6–10.2)
CO2: 27 mmol/L (ref 20–31)
Chloride: 104 mmol/L (ref 98–110)
Creat: 0.76 mg/dL (ref 0.50–1.10)
Glucose, Bld: 80 mg/dL (ref 65–99)
Potassium: 4 mmol/L (ref 3.5–5.3)
Sodium: 139 mmol/L (ref 135–146)
TOTAL PROTEIN: 7 g/dL (ref 6.1–8.1)

## 2016-02-11 LAB — HEMOGLOBIN A1C
HEMOGLOBIN A1C: 5.3 % (ref <5.7)
MEAN PLASMA GLUCOSE: 105 mg/dL

## 2016-02-11 LAB — LIPID PANEL
Cholesterol: 199 mg/dL (ref 125–200)
HDL: 100 mg/dL (ref >=46)
LDL Cholesterol: 77 mg/dL (ref <130)
Total CHOL/HDL Ratio: 2 Ratio (ref <=5.0)
Triglycerides: 108 mg/dL (ref <150)
VLDL: 22 mg/dL (ref <30)

## 2016-02-11 LAB — TSH: TSH: 1.38 m[IU]/L

## 2016-02-11 LAB — CA 125: CA 125: 5 U/mL (ref <35)

## 2016-02-11 NOTE — Telephone Encounter (Signed)
Pt notified of normal labs including CA-125

## 2016-03-13 ENCOUNTER — Encounter: Payer: Self-pay | Admitting: Obstetrics & Gynecology

## 2016-03-21 ENCOUNTER — Ambulatory Visit (INDEPENDENT_AMBULATORY_CARE_PROVIDER_SITE_OTHER): Payer: 59

## 2016-03-21 ENCOUNTER — Other Ambulatory Visit: Payer: 59

## 2016-03-21 DIAGNOSIS — D3910 Neoplasm of uncertain behavior of unspecified ovary: Secondary | ICD-10-CM | POA: Diagnosis not present

## 2016-03-21 DIAGNOSIS — R932 Abnormal findings on diagnostic imaging of liver and biliary tract: Secondary | ICD-10-CM

## 2016-03-21 DIAGNOSIS — I878 Other specified disorders of veins: Secondary | ICD-10-CM | POA: Insufficient documentation

## 2016-03-21 MED ORDER — IOPAMIDOL (ISOVUE-300) INJECTION 61%
100.0000 mL | Freq: Once | INTRAVENOUS | Status: AC | PRN
  Administered 2016-03-21: 100 mL via INTRAVENOUS

## 2016-03-21 NOTE — Addendum Note (Signed)
Addended by: Asencion Islam on: 03/21/2016 08:53 AM   Modules accepted: Orders

## 2016-03-21 NOTE — Assessment & Plan Note (Signed)
Successful right great saphenous vein angiocatheter placed under ultrasound guidance. Further management per CT technician and ordering physician.

## 2016-03-21 NOTE — Progress Notes (Signed)
   Subjective:    I'm seeing this patient as a consultation for:  Dr. Lavonia Drafts  CC: Venous access  HPI: I'm called for further evaluation and definitive treatment for a patient undergoing a CT with contrast with poor venous access. She has already been stuck twice, without success.  Past medical history, Surgical history, Family history not pertinant except as noted below, Social history, Allergies, and medications have been entered into the medical record, reviewed, and no changes needed.   Review of Systems: No headache, visual changes, nausea, vomiting, diarrhea, constipation, dizziness, abdominal pain, skin rash, fevers, chills, night sweats, weight loss, swollen lymph nodes, body aches, joint swelling, muscle aches, chest pain, shortness of breath, mood changes, visual or auditory hallucinations.   Objective:   General: Well Developed, well nourished, and in no acute distress.  Neuro/Psych: Alert and oriented x3, extra-ocular muscles intact, able to move all 4 extremities, sensation grossly intact. Skin: Warm and dry, no rashes noted.  Respiratory: Not using accessory muscles, speaking in full sentences, trachea midline.  Cardiovascular: Pulses palpable, no extremity edema. Abdomen: Does not appear distended.  Procedure: Real-time Ultrasound Guided needle placement in peripheral vein Device: GE Logiq E  Verbal informed consent obtained.  Time-out conducted.  Noted no overlying erythema, induration, or other signs of local infection.  Skin prepped in a sterile fashion.  Local anesthesia: Topical Ethyl chloride.  With sterile technique and under real time ultrasound guidance:  Initial attempt on the left arm failed, second attempt at the right great saphenous vein at the level of the ankle was successful, a 22-gauge angiocatheter was placed in the vein and flushed successfully. Completed without difficulty  Impression: Technically successful ultrasound guided  peripheral vein needle placement.  Impression and Recommendations:   This case required medical decision making of moderate complexity.  Poor venous access Successful right great saphenous vein angiocatheter placed under ultrasound guidance. Further management per CT technician and ordering physician.

## 2016-03-31 ENCOUNTER — Encounter: Payer: Self-pay | Admitting: Obstetrics & Gynecology

## 2016-04-13 ENCOUNTER — Telehealth: Payer: Self-pay | Admitting: Obstetrics & Gynecology

## 2016-04-13 NOTE — Telephone Encounter (Signed)
TC to pt to discuss the lesion found on her liver. We need to compare it to her prior CT scans but, have not received her prior medical records from when she had a LMP ovarian mass resected.  She will f/u at [facility] to resign her release of records.   Once I get those records I will forward them to rad for their reviewed.  If we are unable to get those records will need to do a MRI.  All questions answered.  Jaman Aro L. Harraway-Smith, M.D., Cherlynn June

## 2016-04-13 NOTE — Telephone Encounter (Signed)
This is a duplicate.

## 2016-04-24 ENCOUNTER — Telehealth: Payer: Self-pay | Admitting: *Deleted

## 2016-04-24 NOTE — Telephone Encounter (Signed)
-----   Message from Lavonia Drafts, MD sent at 04/24/2016  1:43 PM EDT ----- Please call pt. She was supposed to come back to sign a release of info form so that we could compare her prev CT to her current CT scan.  Please call her to see when she will come in.  Thx, clh-S

## 2016-04-24 NOTE — Telephone Encounter (Signed)
LM on vociemail inquiring when she was coming in to sign a release of information to get her previous CT scan so that we could compare it to a more recent one per Dr Ihor Dow

## 2016-06-05 ENCOUNTER — Ambulatory Visit (INDEPENDENT_AMBULATORY_CARE_PROVIDER_SITE_OTHER): Payer: 59 | Admitting: Family Medicine

## 2016-06-05 VITALS — BP 114/82 | HR 110 | Temp 98.1°F | Resp 17 | Ht 62.0 in | Wt 179.0 lb

## 2016-06-05 DIAGNOSIS — J209 Acute bronchitis, unspecified: Secondary | ICD-10-CM

## 2016-06-05 MED ORDER — PREDNISONE 20 MG PO TABS: 40.0000 mg | ORAL_TABLET | Freq: Every day | ORAL | 0 refills | Status: DC

## 2016-06-05 MED ORDER — AZITHROMYCIN 250 MG PO TABS
ORAL_TABLET | ORAL | 0 refills | Status: DC
Start: 2016-06-05 — End: 2016-08-18

## 2016-06-05 MED FILL — predniSONE 20 MG TABS: 20 | 5 days supply | Qty: 10 | Fill #0

## 2016-06-05 MED FILL — AZITHROMYCIN 250 MG TABLET: 250 | 5 days supply | Qty: 6 | Fill #0

## 2016-06-05 NOTE — Progress Notes (Signed)
  Chief Complaint  Patient presents with  . Cough    Non productive x2weeks    HPI  Pt reports that she has been coughing for 2 weeks She has been trying to shallow breaths She reports that the cough has been nonproductive and unchanged She is a nonsmoker She denies a history of asthma She tried mucinex, delsym, antihistamines without any improvement She reports that she does not typically have seasonal allergies. She denies any vomiting or acid reflux     Past Medical History:  Diagnosis Date  . Abnormal Pap smear of cervix   . H/O ovarian cancer   . HPV (human papilloma virus) anogenital infection   . Ovarian cyst     Current Outpatient Prescriptions  Medication Sig Dispense Refill  . estrogens, conjugated, (PREMARIN) 0.3 MG tablet Take 0.3 mg by mouth. Take daily for 21 days then do not take for 7 days.    . Multiple Vitamin (MULTIVITAMIN) tablet Take 1 tablet by mouth daily.    Marland Kitchen azithromycin (ZITHROMAX) 250 MG tablet Use 2 tablets today, one tablet daily after 6 tablet 0  . predniSONE (DELTASONE) 20 MG tablet Take 2 tablets (40 mg total) by mouth daily with breakfast. 10 tablet 0   No current facility-administered medications for this visit.     Allergies: No Known Allergies  Past Surgical History:  Procedure Laterality Date  . CESAREAN SECTION     x3  . LAPAROSCOPIC HYSTERECTOMY     total  . LAPAROTOMY      Social History   Social History  . Marital status: Married    Spouse name: N/A  . Number of children: N/A  . Years of education: N/A   Occupational History  . pharmacist    Social History Main Topics  . Smoking status: Never Smoker  . Smokeless tobacco: None  . Alcohol use 0.0 oz/week     Comment: cocktails 2 x week  . Drug use: No  . Sexual activity: Yes    Partners: Male   Other Topics Concern  . None   Social History Narrative  . None    ROS  Objective: Vitals:   06/05/16 1538  BP: 114/82  Pulse: (!) 110  Resp: 17  Temp:  98.1 F (36.7 C)  TempSrc: Oral  SpO2: 98%  Weight: 179 lb (81.2 kg)  Height: 5\' 2"  (1.575 m)    Physical Exam General: alert, oriented, in NAD Head: normocephalic, atraumatic, no sinus tenderness Eyes: EOM intact, no scleral icterus or conjunctival injection Ears: TM clear bilaterally Throat: no pharyngeal exudate or erythema Lymph: no posterior auricular, submental or cervical lymph adenopathy Heart: normal rate, normal sinus rhythm, no murmurs Lungs: bilateral wheezing in lower lobes bilaterally   Assessment and Plan Bernadean was seen today for cough.  Diagnoses and all orders for this visit:  Bronchitis, acute, with bronchospasm Advised pt to stay home today and tomorrow Gave work note Discussed hydration and rest -     predniSONE (DELTASONE) 20 MG tablet; Take 2 tablets (40 mg total) by mouth daily with breakfast. -     azithromycin (ZITHROMAX) 250 MG tablet; Use 2 tablets today, one tablet daily after     [place]

## 2016-06-05 NOTE — Patient Instructions (Addendum)
   IF you received an x-ray today, you will receive an invoice from [facility]. Please contact [facility] at [phone] with questions or concerns regarding your invoice.   IF you received labwork today, you will receive an invoice from [facility]. Please contact [facility] at [phone] with questions or concerns regarding your invoice.   Our billing staff will not be able to assist you with questions regarding bills from these companies.  You will be contacted with the lab results as soon as they are available. The fastest way to get your results is to activate your My Chart account. Instructions are located on the last page of this paperwork. If you have not heard from us regarding the results in 2 weeks, please contact this office.    Acute Bronchitis Bronchitis is inflammation of the airways that extend from the windpipe into the lungs (bronchi). The inflammation often causes mucus to develop. This leads to a cough, which is the most common symptom of bronchitis.  In acute bronchitis, the condition usually develops suddenly and goes away over time, usually in a couple weeks. Smoking, allergies, and asthma can make bronchitis worse. Repeated episodes of bronchitis may cause further lung problems.  CAUSES Acute bronchitis is most often caused by the same virus that causes a cold. The virus can spread from person to person (contagious) through coughing, sneezing, and touching contaminated objects. SIGNS AND SYMPTOMS   Cough.   Fever.   Coughing up mucus.   Body aches.   Chest congestion.   Chills.   Shortness of breath.   Sore throat.  DIAGNOSIS  Acute bronchitis is usually diagnosed through a physical exam. Your health care provider will also ask you questions about your medical history. Tests, such as chest X-rays, are sometimes done to rule out other conditions.  TREATMENT  Acute bronchitis usually goes away in a  couple weeks. Oftentimes, no medical treatment is necessary. Medicines are sometimes given for relief of fever or cough. Antibiotic medicines are usually not needed but may be prescribed in certain situations. In some cases, an inhaler may be recommended to help reduce shortness of breath and control the cough. A cool mist vaporizer may also be used to help thin bronchial secretions and make it easier to clear the chest.  HOME CARE INSTRUCTIONS  Get plenty of rest.   Drink enough fluids to keep your urine clear or pale yellow (unless you have a medical condition that requires fluid restriction). Increasing fluids may help thin your respiratory secretions (sputum) and reduce chest congestion, and it will prevent dehydration.   Take medicines only as directed by your health care provider.  If you were prescribed an antibiotic medicine, finish it all even if you start to feel better.  Avoid smoking and secondhand smoke. Exposure to cigarette smoke or irritating chemicals will make bronchitis worse. If you are a smoker, consider using nicotine gum or skin patches to help control withdrawal symptoms. Quitting smoking will help your lungs heal faster.   Reduce the chances of another bout of acute bronchitis by washing your hands frequently, avoiding people with cold symptoms, and trying not to touch your hands to your mouth, nose, or eyes.   Keep all follow-up visits as directed by your health care provider.  SEEK MEDICAL CARE IF: Your symptoms do not improve after 1 week of treatment.  SEEK IMMEDIATE MEDICAL CARE IF:  You develop an increased fever or chills.   You have chest pain.     You have severe shortness of breath.  You have bloody sputum.   You develop dehydration.  You faint or repeatedly feel like you are going to pass out.  You develop repeated vomiting.  You develop a severe headache. MAKE SURE YOU:   Understand these instructions.  Will watch your  condition.  Will get help right away if you are not doing well or get worse.   This information is not intended to replace advice given to you by your health care provider. Make sure you discuss any questions you have with your health care provider.   Document Released: 08/31/2004 Document Revised: 08/14/2014 Document Reviewed: 01/14/2013 Elsevier Interactive Patient Education 2016 Elsevier Inc.  

## 2016-06-21 ENCOUNTER — Encounter: Payer: Self-pay | Admitting: Obstetrics & Gynecology

## 2016-06-28 ENCOUNTER — Ambulatory Visit: Payer: 59

## 2016-07-05 ENCOUNTER — Encounter: Payer: 59 | Admitting: Skilled Nursing Facility1

## 2016-08-14 ENCOUNTER — Other Ambulatory Visit: Payer: Self-pay | Admitting: Obstetrics & Gynecology

## 2016-08-14 DIAGNOSIS — D3911 Neoplasm of uncertain behavior of right ovary: Secondary | ICD-10-CM

## 2016-08-18 ENCOUNTER — Ambulatory Visit (HOSPITAL_BASED_OUTPATIENT_CLINIC_OR_DEPARTMENT_OTHER)
Admission: RE | Admit: 2016-08-18 | Discharge: 2016-08-18 | Disposition: A | Discharge status: Home / Self Care | Payer: 59 | Source: Ambulatory Visit | Attending: Emergency Medicine | Admitting: Emergency Medicine

## 2016-08-18 ENCOUNTER — Encounter: Payer: Self-pay | Admitting: *Deleted

## 2016-08-18 ENCOUNTER — Emergency Department
Admission: EM | Admit: 2016-08-18 | Discharge: 2016-08-18 | Disposition: A | Discharge status: Home / Self Care | Payer: 59 | Source: Home / Self Care | Attending: Family Medicine | Admitting: Family Medicine

## 2016-08-18 DIAGNOSIS — M79662 Pain in left lower leg: Secondary | ICD-10-CM | POA: Diagnosis not present

## 2016-08-18 DIAGNOSIS — M79605 Pain in left leg: Secondary | ICD-10-CM | POA: Diagnosis not present

## 2016-08-18 NOTE — ED Triage Notes (Signed)
Pt c/o LT lower leg pain x 3-4 days. Denies injury, swelling or redness. IBF gives some relief, but the pain does not completely resolve.

## 2016-08-18 NOTE — ED Provider Notes (Signed)
CSN: [id number]     Arrival date & time 08/18/16  1528 History   First MD Initiated Contact with Patient 08/18/16 1617     Chief Complaint  Patient presents with  . Leg Pain   (Consider location/radiation/quality/duration/timing/severity/associated sxs/prior Treatment) HPI  Kimberly Richards is a 41 y.o. female presenting to UC with c/o 3-4 days of Left lower leg pain that is a dull ache, 5/10, worse with palpation.  She is on her feet often and has been working longer shifts recently but no known injury. No hx of blood clots. Denies swelling or redness to leg. No chest pain or SOB.  Ibuprofen does provide relief but pain does not completely resolve.    Past Medical History:  Diagnosis Date  . Abnormal Pap smear of cervix   . H/O ovarian cancer   . HPV (human papilloma virus) anogenital infection   . Ovarian cyst    Past Surgical History:  Procedure Laterality Date  . ABDOMINAL HYSTERECTOMY    . CESAREAN SECTION     x3  . LAPAROSCOPIC HYSTERECTOMY     total  . LAPAROTOMY     Family History  Problem Relation Age of Onset  . Hypertension Mother   . Diabetes Mother   . Hypertension Father   . Diabetes Father   . Cancer Paternal Grandmother     breast  . Hypertension Paternal Grandmother   . Diabetes Paternal Grandmother   . Hypertension Maternal Grandmother   . Diabetes Maternal Grandmother   . Hypertension Maternal Grandfather   . Diabetes Maternal Grandfather   . Hypertension Paternal Grandfather   . Diabetes Paternal Grandfather    Social History  Substance Use Topics  . Smoking status: Never Smoker  . Smokeless tobacco: Never Used  . Alcohol use 0.0 oz/week     Comment: cocktails 2 x week   OB History    Gravida Para Term Preterm AB Living   3 3 3     3    SAB TAB Ectopic Multiple Live Births                 Review of Systems  Musculoskeletal: Positive for myalgias ( Left calf pain). Negative for arthralgias and joint swelling.  Skin: Negative for color  change and wound.  Neurological: Negative for weakness and numbness.    Allergies  Stadol [butorphanol]  Home Medications   Prior to Admission medications   Medication Sig Start Date End Date Taking? Authorizing Provider  aspirin 81 MG chewable tablet Chew by mouth daily.   Yes Historical Provider, MD  UNKNOWN TO PATIENT    Yes Historical Provider, MD  Multiple Vitamin (MULTIVITAMIN) tablet Take 1 tablet by mouth daily.    Historical Provider, MD   Meds Ordered and Administered this Visit  Medications - No data to display  BP 121/84 (BP Location: Left Arm)   Pulse 85   Temp 98.1 F (36.7 C) (Oral)   Resp 16   Ht 5\' 3"  (1.6 m)   Wt 181 lb (82.1 kg)   SpO2 99%   BMI 32.06 kg/m  No data found.   Physical Exam  Constitutional: She is oriented to person, place, and time. She appears well-developed and well-nourished.  HENT:  Head: Normocephalic and atraumatic.  Eyes: EOM are normal.  Neck: Normal range of motion.  Cardiovascular: Normal rate.   Pulses:      Dorsalis pedis pulses are 2+ on the left side.       Posterior  tibial pulses are 2+ on the left side.  Pulmonary/Chest: Effort normal.  Musculoskeletal: Normal range of motion. She exhibits tenderness. She exhibits no edema.  Left leg: no deformity or edema. Tenderness to posterior knee and posterio-lateral leg. Calf is soft. Full ROM knee and ankle.  Neurological: She is alert and oriented to person, place, and time.  Normal gait.  Skin: Skin is warm and dry. No rash noted. No erythema.  Left lower leg: skin in tact. No ecchymosis or erythema.  Psychiatric: She has a normal mood and affect. Her behavior is normal.  Nursing note and vitals reviewed.   Urgent Care Course   Clinical Course     Procedures (including critical care time)  Labs Review Labs Reviewed - No data to display  Imaging Review US Venous Img Lower Unilateral Left  Result Date: 08/18/2016 CLINICAL DATA:  Acute onset of left upper calf  pain. Initial encounter. EXAM: LEFT LOWER EXTREMITY VENOUS DOPPLER ULTRASOUND TECHNIQUE: Gray-scale sonography with graded compression, as well as color Doppler and duplex ultrasound were performed to evaluate the lower extremity deep venous systems from the level of the common femoral vein and including the common femoral, femoral, profunda femoral, popliteal and calf veins including the posterior tibial, peroneal and gastrocnemius veins when visible. The superficial great saphenous vein was also interrogated. Spectral Doppler was utilized to evaluate flow at rest and with distal augmentation maneuvers in the common femoral, femoral and popliteal veins. COMPARISON:  None. FINDINGS: Contralateral Common Femoral Vein: Respiratory phasicity is normal and symmetric with the symptomatic side. No evidence of thrombus. Normal compressibility. Common Femoral Vein: No evidence of thrombus. Normal compressibility, respiratory phasicity and response to augmentation. Saphenofemoral Junction: No evidence of thrombus. Normal compressibility and flow on color Doppler imaging. Profunda Femoral Vein: No evidence of thrombus. Normal compressibility and flow on color Doppler imaging. Femoral Vein: No evidence of thrombus. Normal compressibility, respiratory phasicity and response to augmentation. Popliteal Vein: No evidence of thrombus. Normal compressibility, respiratory phasicity and response to augmentation. Calf Veins: No evidence of thrombus. Normal compressibility and flow on color Doppler imaging. Superficial Great Saphenous Vein: No evidence of thrombus. Normal compressibility and flow on color Doppler imaging. Venous Reflux:  None. Other Findings:  None. IMPRESSION: No evidence of deep venous thrombosis. Electronically Signed   By: Garald Balding M.D.   On: 08/18/2016 18:35      MDM   1. Left leg pain   2. Pain of left calf    Pt c/o Left lower leg pain w/o known injury. No hx of blood clots. Tenderness to  posterior Left knee and calf. Will send for U/S of Left lower leg to r/o DVT.  Pt to go to [facility] for U/S at 7:30PM this evening.   6:41 PM  U/S was able to be performed earlier than initial appointment. U/S negative for blood clot. Discussed results to pt over phone. Encouraged to alternate cool and warm compresses, acetaminophen and ibuprofen. F/u with PCP as needed.       Noland Fordyce, PA-C 08/18/16 1842

## 2016-10-18 ENCOUNTER — Telehealth: Payer: Self-pay

## 2016-10-18 NOTE — Telephone Encounter (Signed)
Pre- Visit call Completed 

## 2016-10-19 ENCOUNTER — Encounter: Payer: Self-pay | Admitting: Family Medicine

## 2016-10-19 ENCOUNTER — Ambulatory Visit (INDEPENDENT_AMBULATORY_CARE_PROVIDER_SITE_OTHER): Payer: 59 | Admitting: Family Medicine

## 2016-10-19 VITALS — BP 100/50 | HR 72 | Temp 98.2°F | Ht 63.0 in | Wt 181.8 lb

## 2016-10-19 DIAGNOSIS — Z0000 Encounter for general adult medical examination without abnormal findings: Secondary | ICD-10-CM

## 2016-10-19 DIAGNOSIS — Z1231 Encounter for screening mammogram for malignant neoplasm of breast: Secondary | ICD-10-CM

## 2016-10-19 DIAGNOSIS — Z23 Encounter for immunization: Secondary | ICD-10-CM | POA: Diagnosis not present

## 2016-10-19 DIAGNOSIS — Z1239 Encounter for other screening for malignant neoplasm of breast: Secondary | ICD-10-CM

## 2016-10-19 NOTE — Progress Notes (Signed)
Chief Complaint  Patient presents with  . Establish Care    pt requesting CPE-non-fasting     Well Woman Kimberly Richards is here for a complete physical.   Her last physical was >1 year ago.  Current diet: in general, a "healthy" diet. Current exercise: Lift. Weight is stable and she denies daytime fatigue. No LMP recorded. Patient has had a hysterectomy.  Follow with a Dentist? Yes.   Follow with an Optometrist? Yes.   Seatbelt? Yes She follows with GYN for women's health issues.  Health Maintenance Pap/HPV- Yes - 2 years Mammogram- Yes, 2016 Tetanus- Needs one HIV- Yes, neg 4 years ago Flu- Yes  Past Medical History:  Diagnosis Date  . Abnormal Pap smear of cervix   . Anemia   . H/O ovarian cancer   . HPV (human papilloma virus) anogenital infection   . Ovarian cyst     Past Surgical History:  Procedure Laterality Date  . ABDOMINAL HYSTERECTOMY    . CESAREAN SECTION     x3  . LAPAROSCOPIC HYSTERECTOMY     total  . LAPAROTOMY     Medications  Current Outpatient Prescriptions on File Prior to Visit  Medication Sig Dispense Refill  . aspirin 81 MG chewable tablet Chew by mouth daily.    . B Complex-C-Folic Acid (HM VITAMIN B COMPLEX/VITAMIN C PO) Take 1 tablet by mouth 2 (two) times daily.     . Multiple Vitamin (MULTIVITAMIN) tablet Take 1 tablet by mouth 2 (two) times daily.      Allergies Allergies  Allergen Reactions  . Stadol [Butorphanol]     Review of Systems: Constitutional:  no unexpected change in weight, no weakness, no unexplained fevers, sweats, or chills Eye:  no recent significant change in vision Ear/Nose/Mouth/Throat:  Ears:  no tinnitus or vertigo and no recent change in hearing, Nose/Mouth/Throat:  no complaints of nasal congestion or discharge, no sore throat and no recent change in voice or hoarseness Cardiovascular:  no exercise intolerance, no chest pain, no palpitations Respiratory:  no chronic cough, sputum, or hemoptysis and no  shortness of breath Gastrointestinal:  no abdominal pain, no change in bowel habits, no significant change in appetite, no nausea, vomiting, diarrhea, or constipation and no black or bloody stool GU:  Female: negative for dysuria, frequency, and incontinence, Normal menses; no abnormal bleeding, pelvic pain, or discharge Musculoskeletal/Extremities:  no pain, redness, or swelling of the joints Integumentary (Skin/Breast):  no abnormal skin lesions reported, no new breast lumps or masses Neurologic:  no chronic headaches, no numbness, tingling, or tremor Psychiatric:  no anxiety, no depression Endocrine:  denies fatigue, weight changes, heat/cold intolerance, bowel or skin changes, or cardiovascular system symptoms Hematologic/Lymphatic:  no abnormal bleeding, no HIV risk factors, no night sweats, no swollen nodes, no weight loss Allergic/Immunologic:  no history of food or environmental allergies  Exam BP (!) 100/50 (BP Location: Right Arm, Patient Position: Sitting, Cuff Size: Large)   Pulse 72   Temp 98.2 F (36.8 C) (Oral)   Ht 5\' 3"  (1.6 m)   Wt 181 lb 12.8 oz (82.5 kg)   SpO2 99%   BMI 32.20 kg/m  General:  well developed, well nourished, in no apparent distress Skin:  no significant moles, warts, or growths Head:  no masses, lesions, or tenderness Eyes:  pupils equal and round, sclera anicteric without injection Ears:  canals without lesions, TMs shiny without retraction, no obvious effusion, no erythema Nose:  nares patent, septum midline, mucosa normal,  and no drainage or sinus tenderness Throat/Pharynx:  lips and gingiva without lesion; tongue and uvula midline; non-inflamed pharynx; no exudates or postnasal drainage Neck: neck supple without adenopathy, thyromegaly, or masses Breasts:  Not done Thorax:  nontender Lungs:  clear to auscultation, breath sounds equal bilaterally, no respiratory distress Cardio:  regular rate and rhythm without murmurs, heart sounds without  clicks or rubs, point of maximal impulse normal; no lifts, heaves, or thrills Abdomen:  abdomen soft, nontender; bowel sounds normal; no masses or organomegaly Genital: Not done Musculoskeletal:  symmetrical muscle groups noted without atrophy or deformity Extremities:  no clubbing, cyanosis, or edema, no deformities, no skin discoloration Neuro:  gait normal; deep tendon reflexes normal and symmetric Psych: well oriented with normal range of affect and appropriate judgment/insight  Assessment and Plan  Well adult exam - Plan: Comprehensive metabolic panel, Lipid panel  Screening for malignant neoplasm of breast - Plan: MM DIGITAL SCREENING BILATERAL   Well 41 y.o. female. Counseled on diet and exercise. Healthy diet handout given. Other orders as above. Follow up in 1 year pending the above. Will schedule labs as she is a hard stick for lab draws. She will be fasting. The patient voiced understanding and agreement to the plan.  Glen Allen, DO 10/19/16 1:15 PM

## 2016-10-19 NOTE — Progress Notes (Signed)
Pre visit review using our clinic review tool, if applicable. No additional management support is needed unless otherwise documented below in the visit note. 

## 2016-10-19 NOTE — Addendum Note (Signed)
Addended by: Harl Bowie on: 10/19/2016 02:14 PM   Modules accepted: Orders

## 2016-10-19 NOTE — Patient Instructions (Signed)

## 2016-10-25 ENCOUNTER — Other Ambulatory Visit (INDEPENDENT_AMBULATORY_CARE_PROVIDER_SITE_OTHER): Payer: 59

## 2016-10-25 DIAGNOSIS — Z0000 Encounter for general adult medical examination without abnormal findings: Secondary | ICD-10-CM

## 2016-10-25 LAB — LIPID PANEL
CHOL/HDL RATIO: 2
Cholesterol: 197 mg/dL (ref 0–200)
HDL: 83.5 mg/dL (ref >39.00)
LDL CALC: 96 mg/dL (ref 0–99)
NONHDL: 113.83
Triglycerides: 90 mg/dL (ref 0.0–149.0)
VLDL: 18 mg/dL (ref 0.0–40.0)

## 2016-10-25 LAB — COMPREHENSIVE METABOLIC PANEL
ALBUMIN: 3.7 g/dL (ref 3.5–5.2)
ALK PHOS: 67 U/L (ref 39–117)
ALT: 22 U/L (ref 0–35)
AST: 19 U/L (ref 0–37)
BUN: 13 mg/dL (ref 6–23)
CO2: 28 mEq/L (ref 19–32)
CREATININE: 0.75 mg/dL (ref 0.40–1.20)
Calcium: 10 mg/dL (ref 8.4–10.5)
Chloride: 105 mEq/L (ref 96–112)
GFR: 108.86 mL/min (ref >60.00)
GLUCOSE: 93 mg/dL (ref 70–99)
POTASSIUM: 3.8 meq/L (ref 3.5–5.1)
SODIUM: 137 meq/L (ref 135–145)
TOTAL PROTEIN: 7 g/dL (ref 6.0–8.3)
Total Bilirubin: 0.3 mg/dL (ref 0.2–1.2)

## 2016-11-27 ENCOUNTER — Ambulatory Visit (HOSPITAL_BASED_OUTPATIENT_CLINIC_OR_DEPARTMENT_OTHER): Payer: 59

## 2016-11-28 ENCOUNTER — Ambulatory Visit (HOSPITAL_BASED_OUTPATIENT_CLINIC_OR_DEPARTMENT_OTHER)
Admission: RE | Admit: 2016-11-28 | Discharge: 2016-11-28 | Disposition: A | Discharge status: Home / Self Care | Payer: 59 | Source: Ambulatory Visit | Attending: Family Medicine | Admitting: Family Medicine

## 2016-11-28 ENCOUNTER — Encounter (HOSPITAL_BASED_OUTPATIENT_CLINIC_OR_DEPARTMENT_OTHER): Payer: Self-pay

## 2016-11-28 ENCOUNTER — Other Ambulatory Visit: Payer: Self-pay | Admitting: Family Medicine

## 2016-11-28 DIAGNOSIS — Z1239 Encounter for other screening for malignant neoplasm of breast: Secondary | ICD-10-CM

## 2016-11-28 DIAGNOSIS — Z1231 Encounter for screening mammogram for malignant neoplasm of breast: Secondary | ICD-10-CM | POA: Diagnosis not present

## 2017-05-02 ENCOUNTER — Ambulatory Visit (INDEPENDENT_AMBULATORY_CARE_PROVIDER_SITE_OTHER): Payer: 59 | Admitting: Obstetrics & Gynecology

## 2017-05-02 ENCOUNTER — Encounter: Payer: Self-pay | Admitting: Obstetrics & Gynecology

## 2017-05-02 VITALS — BP 123/88 | HR 82 | Ht 63.0 in | Wt 173.0 lb

## 2017-05-02 DIAGNOSIS — N952 Postmenopausal atrophic vaginitis: Secondary | ICD-10-CM | POA: Diagnosis not present

## 2017-05-02 MED ORDER — ESTRADIOL 0.1 MG/GM VA CREA: 1.0000 | TOPICAL_CREAM | Freq: Every day | VAGINAL | 12 refills | Status: DC

## 2017-05-02 MED FILL — ESTRADIOL 0.1 MG/GM CRM: 0.1 | 10 days supply | Qty: 43 | Fill #0

## 2017-05-02 NOTE — Patient Instructions (Signed)
Atrophic Vaginitis Atrophic vaginitis is a condition in which the tissues that line the vagina become dry and thin. This condition is most common in women who have stopped having regular menstrual periods (menopause). This usually starts when a woman is 45-43 years old. Estrogen helps to keep the vagina moist. It stimulates the vagina to produce a clear fluid that lubricates the vagina for sexual intercourse. This fluid also protects the vagina from infection. Lack of estrogen can cause the lining of the vagina to get thinner and dryer. The vagina may also shrink in size. It may become less elastic. Atrophic vaginitis tends to get worse over time as a woman's estrogen level drops. What are the causes? This condition is caused by the normal drop in estrogen that happens around the time of menopause. What increases the risk? Certain conditions or situations may lower a woman's estrogen level, which increases her risk of atrophic vaginitis. These include:  Taking medicine that blocks estrogen.  Having ovaries removed surgically.  Being treated for cancer with X-ray treatment (radiation) or medicines (chemotherapy).  Exercising very hard and often.  Having an eating disorder (anorexia).  Giving birth or breastfeeding.  Being over the age of 50.  Smoking.  What are the signs or symptoms? Symptoms of this condition include:  Pain, soreness, or bleeding during sexual intercourse (dyspareunia).  Vaginal burning, irritation, or itching.  Pain or bleeding during a vaginal examination using a speculum (pelvic exam).  Loss of interest in sexual activity.  Having burning pain when passing urine.  Vaginal discharge that is brown or yellow.  In some cases, there are no symptoms. How is this diagnosed? This condition is diagnosed with a medical history and physical exam. This will include a pelvic exam that checks whether the inside of your vagina appears pale, thin, or dry. Rarely, you may  also have other tests, including:  A urine test.  A test that checks the acid balance in your vaginal fluid (acid balance test).  How is this treated? Treatment for this condition may depend on the severity of your symptoms. Treatment may include:  Using an over-the-counter vaginal lubricant before you have sexual intercourse.  Using a long-acting vaginal moisturizer.  Using low-dose vaginal estrogen for moderate to severe symptoms that do not respond to other treatments. Options include creams, tablets, and inserts (vaginal rings). Before using vaginal estrogen, tell your health care provider if you have a history of: ? Breast cancer. ? Endometrial cancer. ? Blood clots.  Taking medicines. You may be able to take a daily pill for dyspareunia. Discuss all of the risks of this medicine with your health care provider. It is usually not recommended for women who have a family history or personal history of breast cancer.  If your symptoms are very mild and you are not sexually active, you may not need treatment. Follow these instructions at home:  Take medicines only as directed by your health care provider. Do not use herbal or alternative medicines unless your health care provider says that you can.  Use over-the-counter creams, lubricants, or moisturizers for dryness only as directed by your health care provider.  If your atrophic vaginitis is caused by menopause, discuss all of your menopausal symptoms and treatment options with your health care provider.  Do not douche.  Do not use products that can make your vagina dry. These include: ? Scented feminine sprays. ? Scented tampons. ? Scented soaps.  If it hurts to have sex, talk with your sexual   partner. Contact a health care provider if:  Your discharge looks different than normal.  Your vagina has an unusual smell.  You have new symptoms.  Your symptoms do not improve with treatment.  Your symptoms get worse. This  information is not intended to replace advice given to you by your health care provider. Make sure you discuss any questions you have with your health care provider. Document Released: 12/08/2014 Document Revised: 12/30/2015 Document Reviewed: 07/15/2014 Elsevier Interactive Patient Education  2018 [employer] Inc[employer]  

## 2017-05-02 NOTE — Progress Notes (Addendum)
History:  41 y.o. [id number] here today for eval of vaginal dryness. She has tried different natural regimine with no relief of sx. Pt has a h/o ovarian cancer. She is s/p BSO. She has 1 ovary removed and was found to have disease in the other ovary and it was later removed. She is s/p CT f/u with no evidence of active disease.  Pt was on Premarin immediately after surgery for 6 months prescribed by [facility]. But, stopped it because she moved. Vaginal dryness and decreased libido. She is not sure if her decreased libido is related to the pain.    Last mamogram  11/28/2016  The following portions of the patient's history were reviewed and updated as appropriate: allergies, current medications, past family history, past medical history, past social history, past surgical history and problem list.  Review of Systems:  Pertinent items are noted in HPI.   Objective:  Physical Exam Blood pressure 123/88, pulse 82, height 5\' 3"  (1.6 m), weight 173 lb (78.5 kg).  CONSTITUTIONAL: Well-developed, well-nourished female in no acute distress.  HENT:  Normocephalic, atraumatic EYES: Conjunctivae and EOM are normal. No scleral icterus.  NECK: Normal range of motion SKIN: Skin is warm and dry. No rash noted. Not diaphoretic.No pallor. [place]: Alert and oriented to person, place, and time. Normal coordination.  Abd: Soft, nontender and nondistended. Well healed vertical incision Pelvic: Normal appearing external genitalia; normal appearing vaginal mucosa. Some dryness and min atrophic changes.   Normal discharge.  No palpable masses and no adnexal tenderness   Assessment & Plan:  Atrophic vaginitis pt with surgical menopause. .  Discussed with pt treatment options.   Topical EES. Nightly for 2 weeks and then 3x/ week Coconut oil bid   F/u in 8 week or sooner prn  Total face-to-face time with patient was 20 min.  Greater than 50% was spent in counseling and coordination of care with the  patient.  Arval Brandstetter L. Harraway-Smith, M.D., Cherlynn June

## 2017-05-10 ENCOUNTER — Telehealth: Payer: Self-pay

## 2017-05-10 NOTE — Telephone Encounter (Signed)
[facility] pharmacy calling because the patient was given her prescription for estrace and it appears it should have last the patient a full month. But the patient has been using the estrace cream once a day and using a whole applicator full at bedtime. The pharmacist thinks that maybe too much even though the directions read "use one applicator full". Pharmacist wanting clarification because patient also reported that she is suppose to use it daily for one week and then move to everyother day but that's not how prescription reads.  Need prescription clarification. Want to know an exact dose in grams of how much patient should use daily. Erma [phone]  Will route to provider. Kathrene Alu RNBSN

## 2017-05-14 NOTE — Telephone Encounter (Signed)
Please call pt. She only needed 1/3 applicatorful. She can have a refill. May begin using every other day.  Thx, Clh-S

## 2017-05-15 MED FILL — ESTRADIOL 0.1 MG/GM CREA: 0.1 | 84 days supply | Qty: 43 | Fill #0

## 2017-05-15 NOTE — Telephone Encounter (Signed)
Pharmacy called and made aware that patient can have a new prescription for Estrace insert one gram every other night. [facility] tech will contact patient when prescription ready. Kathrene Alu RNBSN

## 2017-07-04 ENCOUNTER — Encounter: Payer: Self-pay | Admitting: Obstetrics & Gynecology

## 2017-07-04 ENCOUNTER — Ambulatory Visit (INDEPENDENT_AMBULATORY_CARE_PROVIDER_SITE_OTHER): Payer: 59 | Admitting: Obstetrics & Gynecology

## 2017-07-04 ENCOUNTER — Ambulatory Visit: Payer: 59 | Admitting: Obstetrics & Gynecology

## 2017-07-04 DIAGNOSIS — N952 Postmenopausal atrophic vaginitis: Secondary | ICD-10-CM | POA: Diagnosis not present

## 2017-07-04 MED ORDER — ESTRADIOL 0.1 MG/GM VA CREA: TOPICAL_CREAM | VAGINAL | 12 refills | Status: DC

## 2017-07-04 MED ORDER — ESTRADIOL 0.1 MG/GM VA CREA: 1.0000 | TOPICAL_CREAM | Freq: Every day | VAGINAL | 12 refills | Status: DC

## 2017-07-04 NOTE — Progress Notes (Signed)
History:  41 y.o. [id number] here today for f/u of atrophic vaginitis.  She reports that she does not like the feel of the coconut oil on her skin.     The following portions of the patient's history were reviewed and updated as appropriate: allergies, current medications, past family history, past medical history, past social history, past surgical history and problem list.  Review of Systems:  Pertinent items are noted in HPI.   Objective:  Physical Exam Blood pressure 123/67, pulse 91, weight 179 lb (81.2 kg).  CONSTITUTIONAL: Well-developed, well-nourished female in no acute distress.  HENT:  Normocephalic, atraumatic EYES: Conjunctivae and EOM are normal. No scleral icterus.  NECK: Normal range of motion SKIN: Skin is warm and dry. No rash noted. Not diaphoretic.No pallor. [place]: Alert and oriented to person, place, and time. Normal coordination.  Pelvic: Normal appearing external genitalia- tissue sl atrophic and dry.    Assessment & Plan:   D/w pt methods of controlling the messiness of the EES and coconut oil. She will try whipped butter of pure shea butter or coconut butter with almond oil . F/u 3 months or sooner prn  Total face-to-face time with patient was 20 min.  Greater than 50% was spent in counseling and coordination of care with the patient.   Damika Harmon L. Harraway-Smith, M.D., Cherlynn June

## 2017-07-05 ENCOUNTER — Encounter: Payer: Self-pay | Admitting: Obstetrics & Gynecology

## 2017-08-02 MED FILL — ESTRADIOL 0.1 MG/GM CREA: 0.1 | 84 days supply | Qty: 43 | Fill #1

## 2017-11-19 ENCOUNTER — Telehealth: Payer: Self-pay

## 2017-11-19 DIAGNOSIS — D3910 Neoplasm of uncertain behavior of unspecified ovary: Secondary | ICD-10-CM

## 2017-11-19 NOTE — Telephone Encounter (Signed)
-----   Message from Lavonia Drafts, MD sent at 11/18/2017  9:50 AM EDT ----- Pt was supposed to have a CT of the pelvis.  Please discuss and reorder.  It is for surveillance of her prior OV ca.  She may need a BMP prior to the study.  Thx, clh-S

## 2017-11-19 NOTE — Telephone Encounter (Signed)
Patient called and made aware of need to schedule another CT of pelvis.  Patient made aware that we will order it and imagine will call her to schedule.   Went ahead and scheduled patient for her annual exam with Dr. Ihor Dow on 12-26-17. Kathrene Alu RN

## 2017-12-15 ENCOUNTER — Ambulatory Visit (HOSPITAL_BASED_OUTPATIENT_CLINIC_OR_DEPARTMENT_OTHER)
Admission: RE | Admit: 2017-12-15 | Discharge: 2017-12-15 | Disposition: A | Discharge status: Home / Self Care | Payer: Federal, State, Local not specified - PPO | Source: Ambulatory Visit | Attending: Obstetrics & Gynecology | Admitting: Obstetrics & Gynecology

## 2017-12-15 ENCOUNTER — Encounter (HOSPITAL_BASED_OUTPATIENT_CLINIC_OR_DEPARTMENT_OTHER): Payer: Self-pay

## 2017-12-15 DIAGNOSIS — D3910 Neoplasm of uncertain behavior of unspecified ovary: Secondary | ICD-10-CM | POA: Diagnosis present

## 2017-12-15 HISTORY — DX: Malignant (primary) neoplasm, unspecified: C80.1

## 2017-12-15 MED ORDER — IOPAMIDOL (ISOVUE-300) INJECTION 61%
100.0000 mL | Freq: Once | INTRAVENOUS | Status: AC | PRN
  Administered 2017-12-15: 100 mL via INTRAVENOUS

## 2017-12-17 ENCOUNTER — Telehealth: Payer: Self-pay

## 2017-12-17 NOTE — Telephone Encounter (Signed)
-----   Message from Lavonia Drafts, MD sent at 12/17/2017 11:19 AM EDT ----- Please call pt. Her pelvic CT was WNL.   We will see her back in Nov!  clh-S

## 2017-12-17 NOTE — Telephone Encounter (Signed)
Left message for patient to return call to office.   Pelvic CT normal. Kathrene Alu RN

## 2017-12-18 ENCOUNTER — Ambulatory Visit: Payer: Federal, State, Local not specified - PPO | Admitting: Family Medicine

## 2017-12-18 ENCOUNTER — Encounter: Payer: Self-pay | Admitting: Family Medicine

## 2017-12-18 VITALS — BP 108/78 | HR 69 | Temp 98.2°F | Ht 63.0 in | Wt 177.4 lb

## 2017-12-18 DIAGNOSIS — F418 Other specified anxiety disorders: Secondary | ICD-10-CM | POA: Diagnosis not present

## 2017-12-18 DIAGNOSIS — L709 Acne, unspecified: Secondary | ICD-10-CM | POA: Diagnosis not present

## 2017-12-18 HISTORY — DX: Other specified anxiety disorders: F41.8

## 2017-12-18 MED ORDER — SERTRALINE HCL 50 MG PO TABS: 50.0000 mg | ORAL_TABLET | Freq: Every day | ORAL | 3 refills | Status: DC

## 2017-12-18 MED ORDER — CLINDAMYCIN PHOSPHATE 1 % EX GEL: Freq: Two times a day (BID) | CUTANEOUS | 2 refills | Status: DC

## 2017-12-18 MED FILL — CLINDAMYCIN PH 1% GEL: 1 | 30 days supply | Qty: 30 | Fill #0

## 2017-12-18 MED FILL — SERTRALINE HCL 50 MG TABLET: 50 | 30 days supply | Qty: 30 | Fill #0

## 2017-12-18 NOTE — Progress Notes (Signed)
Chief Complaint  Patient presents with  . Insomnia  . Anxiety  . Acne    Subjective Kimberly Richards is an 42 y.o. female who presents with anxiety/depression Symptoms began 1 mo ago. Anxiety symptoms: insomnia, psychomotor agitation, racing thoughts. Depressive symptoms include slightly decreased mood Family history significant for anxiety/depression in mom and dad, "everybody". Possible organic causes contributing are: none Social stressors include dead of grandma and best friend, working mom tasks/responsibility She is not currently on any medicine. She is not currently following with a psychologist- has an appt at end of mo.  Pt has had issues with facial acne over past mo as well. No new topicals or change in makeup. Uses topical cleanser. Has never tried anything else. No PO medication.  Past Medical History:  Diagnosis Date  . Abnormal Pap smear of cervix   . Anemia   . Anxiety with depression 12/18/2017  . Cancer ([place])   . H/O ovarian cancer   . HPV (human papilloma virus) anogenital infection   . Ovarian cyst     Medications Current Outpatient Medications on File Prior to Visit  Medication Sig Dispense Refill  . B Complex-C-Folic Acid (HM VITAMIN B COMPLEX/VITAMIN C PO) Take 1 tablet by mouth 2 (two) times daily.     . Cholecalciferol (VITAMIN D3) 5000 units CAPS Take 1 capsule by mouth daily.    Marland Kitchen estradiol (ESTRACE VAGINAL) 0.1 MG/GM vaginal cream Use .5 grams 3x/week 42.5 g 12  . Melatonin 5 MG TABS Take 1 tablet by mouth at bedtime.    . Multiple Vitamin (MULTIVITAMIN) tablet Take 1 tablet by mouth 2 (two) times daily.      Allergies Allergies  Allergen Reactions  . Stadol [Butorphanol]     Family History Family History  Problem Relation Age of Onset  . Hypertension Mother   . Diabetes Mother   . Hypertension Father   . Diabetes Father   . Cancer Paternal Grandmother        breast  . Hypertension Paternal Grandmother   . Diabetes Paternal  Grandmother   . Hypertension Maternal Grandmother   . Diabetes Maternal Grandmother   . Hypertension Maternal Grandfather   . Diabetes Maternal Grandfather   . Hypertension Paternal Grandfather   . Diabetes Paternal Grandfather      Review Of Systems Cardiovascular:  no chest pain, no palpitations Psychiatric: as noted in HPI  Exam BP 108/78 (BP Location: Left Arm, Patient Position: Sitting, Cuff Size: Normal)   Pulse 69   Temp 98.2 F (36.8 C) (Oral)   Ht 5\' 3"  (1.6 m)   Wt 177 lb 6 oz (80.5 kg)   SpO2 98%   BMI 31.42 kg/m  General:  well developed, well nourished, in no apparent distress Neck: neck supple without adenopathy, thyromegaly, or masses Lungs:  clear to auscultation, breath sounds equal bilaterally, normal respiratory effort without accessory muscle use Cardio:  regular rate and rhythm without murmurs Abdomen:  abdomen soft, nontender; bowel sounds normal; no masses or organomegaly Neuro:  deep tendon reflexes normal and symmetric and no cerebellar signs or ataxia noted Skin: Some comedones noted on face, no active drainage or erythema noted Psych: well oriented with normal range of affect and age-appropriate judgement/insight  Assessment and Plan  Anxiety with depression - Plan: sertraline (ZOLOFT) 50 MG tablet  Acne, unspecified acne type - Plan: clindamycin (CLINDAGEL) 1 % gel  Orders as above. Counseled on adjunctive treatment with exercise/physical activity. Number for [place] provided  in AVS. She may consider this if things do not work out with the counselor she has appt with. Start topical Clinda, cont benz peroxide.  F/u in 6 weeks. Patient voiced understanding and agreement to the plan.  [place], DO 12/18/17 5:26 PM

## 2017-12-18 NOTE — Progress Notes (Signed)
Pre visit review using our clinic review tool, if applicable. No additional management support is needed unless otherwise documented below in the visit note. 

## 2017-12-18 NOTE — Patient Instructions (Signed)
Please consider counseling. Contact [phone] to schedule an appointment or inquire about cost/insurance coverage.  Sleep Hygiene Tips:  Do not watch TV or look at screens within 1 hour of going to bed. If you do, make sure there is a blue light filter (nighttime mode) involved.  Try to go to bed around the same time every night. Wake up at the same time within 1 hour of regular time. Ex: If you wake up at 7 AM for work, do not sleep past 8 AM on days that you don't work.  Do not drink alcohol before bedtime.  Do not consume caffeine-containing beverages after noon or within 9 hours of intended bedtime.  Get regular exercise/physical activity in your life, but not within 2 hours of planned bedtime.  Do not take naps.   Do not eat within 2 hours of planned bedtime.  Melatonin, 3-5 mg 30-60 minutes before planned bedtime may be helpful.   The bed should be for sleep or sex only. If after 20-30 minutes you are unable to fall asleep, get up and do something relaxing. Do this until you feel ready to go to sleep again.   Coping skills Choose 5 that work for you:  Take a deep breath  Count to 20  Read a book  Do a puzzle  Meditate  Bake  Sing  Knit  Garden  Pray  Go outside  Call a friend  Listen to music  Take a walk  Color  Send a note  Take a bath  Watch a movie  Be alone in a quiet place  Pet an animal  Visit a friend  Journal  Exercise  Stretch

## 2017-12-26 ENCOUNTER — Ambulatory Visit (INDEPENDENT_AMBULATORY_CARE_PROVIDER_SITE_OTHER): Payer: Federal, State, Local not specified - PPO | Admitting: Obstetrics & Gynecology

## 2017-12-26 ENCOUNTER — Encounter: Payer: Self-pay | Admitting: Obstetrics & Gynecology

## 2017-12-26 VITALS — BP 118/82 | HR 70 | Ht 63.0 in | Wt 176.0 lb

## 2017-12-26 DIAGNOSIS — Z01419 Encounter for gynecological examination (general) (routine) without abnormal findings: Secondary | ICD-10-CM | POA: Diagnosis not present

## 2017-12-26 DIAGNOSIS — Z1239 Encounter for other screening for malignant neoplasm of breast: Secondary | ICD-10-CM

## 2017-12-26 NOTE — Progress Notes (Signed)
Subjective:     Kimberly Richards is a 42 y.o. female here for a routine exam.  Current complaints: pt was recently started on Sertraline for anxiety she has only been on it for 1 week but, she is worried about hte known side effect of decreased libido.   She has no sx at present. She is also scheduled to see a Social worker. .    Gynecologic History No LMP recorded. Patient has had a hysterectomy. Contraception: status post hysterectomy Last Pap: hyst Last mammogram: 11/28/2016. Results were: normal  Obstetric History OB History  Gravida Para Term Preterm AB Living  3 3 3     3   SAB TAB Ectopic Multiple Live Births               # Outcome Date GA Lbr Len/2nd Weight Sex Delivery Anes PTL Lv  3 Term      CS-Unspec     2 Term      CS-Unspec     1 Term      CS-Unspec      The following portions of the patient's history were reviewed and updated as appropriate: allergies, current medications, past family history, past medical history, past social history, past surgical history and problem list.  Review of Systems Pertinent items are noted in HPI.    Objective:  BP 118/82   Pulse 70   Ht 5\' 3"  (1.6 m)   Wt 176 lb (79.8 kg)   BMI 31.18 kg/m   General Appearance:    Alert, cooperative, no distress, appears stated age  Head:    Normocephalic, without obvious abnormality, atraumatic  Eyes:    conjunctiva/corneas clear, EOM's intact, both eyes  Ears:    Normal external ear canals, both ears  Nose:   Nares normal, septum midline, mucosa normal, no drainage    or sinus tenderness  Throat:   Lips, mucosa, and tongue normal; teeth and gums normal  Neck:   Supple, symmetrical, trachea midline, no adenopathy;    thyroid:  no enlargement/tenderness/nodules  Back:     Symmetric, no curvature, ROM normal, no CVA tenderness  Lungs:     Clear to auscultation bilaterally, respirations unlabored  Chest Wall:    No tenderness or deformity   Heart:    Regular rate and rhythm, S1 and S2 normal, no  murmur, rub   or gallop  Breast Exam:    No tenderness, masses, or nipple abnormality  Abdomen:     Soft, non-tender, bowel sounds active all four quadrants,    no masses, no organomegaly  Genitalia:    Normal female without lesion, discharge or tenderness; uterus and cervix surgically absent      Extremities:   Extremities normal, atraumatic, no cyanosis or edema  Pulses:   2+ and symmetric all extremities  Skin:   Skin color, texture, turgor normal, no rashes or lesions    12/15/2017 CLINICAL DATA:  42 year old female with history of ovarian cancer status post total abdominal hysterectomy 5 years ago. No chemotherapy or radiation therapy.  EXAM: CT ABDOMEN AND PELVIS WITH CONTRAST  TECHNIQUE: Multidetector CT imaging of the abdomen and pelvis was performed using the standard protocol following bolus administration of intravenous contrast.  CONTRAST:  170mL ISOVUE-300 IOPAMIDOL (ISOVUE-300) INJECTION 61%  COMPARISON:  CT the abdomen and pelvis 03/21/2016.  FINDINGS: Lower chest: Unremarkable.  Hepatobiliary: There continues to be some irregular lobularity adjacent to the posterior aspect of segment 7 of the liver, which appears very similar in  size to prior study from 03/21/2016, presumably a benign variant of the right hepatic lobe, or less likely a splenule. The remainder of the liver is otherwise normal in appearance, without suspicious cystic or solid hepatic lesions. No intra or extrahepatic biliary ductal dilatation. Gallbladder is normal in appearance.  Pancreas: No pancreatic mass. No pancreatic ductal dilatation. No pancreatic or peripancreatic fluid or inflammatory changes.  Spleen: Unremarkable.  Adrenals/Urinary Tract: Bilateral kidneys and bilateral adrenal glands are normal in appearance. No hydroureteronephrosis. Urinary bladder is normal in appearance.  Stomach/Bowel: Normal appearance of the stomach. No pathologic dilatation of small bowel  or colon. The appendix is not confidently identified and may be surgically absent. Regardless, there are no inflammatory changes noted adjacent to the cecum to suggest the presence of an acute appendicitis at this time.  Vascular/Lymphatic: No significant atherosclerotic disease, aneurysm or dissection noted in the abdominal or pelvic vasculature. No lymphadenopathy noted in the abdomen or pelvis.  Reproductive: Status post total abdominal hysterectomy and bilateral salpingo-oophorectomy.  Other: No significant volume of ascites.  No pneumoperitoneum.  Musculoskeletal: There are no aggressive appearing lytic or blastic lesions noted in the visualized portions of the skeleton.  IMPRESSION: 1. No findings to suggest metastatic disease in the abdomen or pelvis. 2. No acute findings. 3. Irregular contour along the posterior aspect of the right lobe of the liver adjacent to segment 7 is stable in size and appearance to prior study from 2017, presumably a benign anatomical variant. 4. Additional incidental findings, as above.  Assessment:    Healthy female exam.   Atrophic vaginitic- improved Breast cancer screen  H/o ov cancer- CT reviewed WNL. Repeat in 1 year   Plan:    Follow up in: 1 year.   f/u sooner prn Screening mammogram  Repeat pelvic CT in 1 year  [name] L. Harraway-Smith, M.D., Cherlynn June

## 2018-01-19 ENCOUNTER — Ambulatory Visit (HOSPITAL_BASED_OUTPATIENT_CLINIC_OR_DEPARTMENT_OTHER)
Admission: RE | Admit: 2018-01-19 | Discharge: 2018-01-19 | Disposition: A | Discharge status: Home / Self Care | Payer: Federal, State, Local not specified - PPO | Source: Ambulatory Visit | Attending: Obstetrics & Gynecology | Admitting: Obstetrics & Gynecology

## 2018-01-19 DIAGNOSIS — Z1231 Encounter for screening mammogram for malignant neoplasm of breast: Secondary | ICD-10-CM | POA: Diagnosis not present

## 2018-01-19 DIAGNOSIS — Z1239 Encounter for other screening for malignant neoplasm of breast: Secondary | ICD-10-CM

## 2018-01-31 ENCOUNTER — Ambulatory Visit: Payer: Federal, State, Local not specified - PPO | Admitting: Family Medicine

## 2018-01-31 ENCOUNTER — Encounter: Payer: Self-pay | Admitting: Family Medicine

## 2018-01-31 VITALS — BP 98/62 | HR 73 | Temp 98.1°F | Ht 63.0 in | Wt 171.5 lb

## 2018-01-31 DIAGNOSIS — L709 Acne, unspecified: Secondary | ICD-10-CM

## 2018-01-31 DIAGNOSIS — F418 Other specified anxiety disorders: Secondary | ICD-10-CM | POA: Diagnosis not present

## 2018-01-31 MED ORDER — MINOCYCLINE HCL 100 MG PO TABS: 100.0000 mg | ORAL_TABLET | Freq: Two times a day (BID) | ORAL | 1 refills | Status: DC

## 2018-01-31 MED ORDER — SERTRALINE HCL 50 MG PO TABS: 50.0000 mg | ORAL_TABLET | Freq: Every day | ORAL | 1 refills | Status: DC

## 2018-01-31 MED FILL — SERTRALINE HCL 50 MG TABLET: 50 | 90 days supply | Qty: 90 | Fill #0

## 2018-01-31 MED FILL — MINOCYCLINE HCL 100 MG TABL: 100 | 30 days supply | Qty: 60 | Fill #0

## 2018-01-31 NOTE — Progress Notes (Signed)
Chief Complaint  Patient presents with  . Follow-up    sertraline    Subjective Kimberly Richards presents for f/u anxiety/depression.  She is currently being treated with Zoloft 50 mg/d.  Reports improvement, 70-80% better, since treatment. No thoughts of harming self or others. No self-medication with alcohol, prescription drugs or illicit drugs. Pt is not following with a counselor/psychologist. Trying to get appt.  Acne Rx'd clindagel at last visit. Did not do anything. Still using BP for cleanser. No spread. Did bring up spironolactone.   ROS Psych: No homicidal or suicidal thoughts  Past Medical History:  Diagnosis Date  . Abnormal Pap smear of cervix   . Anemia   . Anxiety with depression 12/18/2017  . Cancer ([place])   . H/O ovarian cancer   . HPV (human papilloma virus) anogenital infection   . Ovarian cyst    Exam BP 98/62 (BP Location: Left Arm, Patient Position: Sitting, Cuff Size: Normal)   Pulse 73   Temp 98.1 F (36.7 C) (Oral)   Ht 5\' 3"  (1.6 m)   Wt 171 lb 8 oz (77.8 kg)   SpO2 99%   BMI 30.38 kg/m  General:  well developed, well nourished, in no apparent distress Lungs:  clear to auscultation, breath sounds equal bilaterally, no respiratory distress Cardio:  regular rate and rhythm without murmurs, heart sounds without clicks or rubs Endo: No signs of hirsuitism Skin: Acne noted on face Psych: well oriented with normal range of affect and age-appropriate judgement/insight, alert and oriented x4.  Assessment and Plan  Anxiety with depression - Plan: sertraline (ZOLOFT) 50 MG tablet  Acne, unspecified acne type - Plan: minocycline (DYNACIN) 100 MG tablet  Orders as above. Cont Zoloft. She is in process of getting est w/ counselor. Change Clindagel to PO minocycline. F/u in in 4-6 weeks for CPE. The patient voiced understanding and agreement to the plan.  [place], DO 01/31/18 4:43 PM

## 2018-01-31 NOTE — Patient Instructions (Addendum)
Continue Zoloft for now.   Stop the Clindagel. We have called in a new medicine.   Come to your next appointment fasting.  Aim to do some physical exertion for 150 minutes per week. This is typically divided into 5 days per week, 30 minutes per day. The activity should be enough to get your heart rate up. Anything is better than nothing if you have time constraints.  Let us know if you need anything.

## 2018-01-31 NOTE — Progress Notes (Signed)
Pre visit review using our clinic review tool, if applicable. No additional management support is needed unless otherwise documented below in the visit note. 

## 2018-03-14 ENCOUNTER — Ambulatory Visit (INDEPENDENT_AMBULATORY_CARE_PROVIDER_SITE_OTHER): Payer: Federal, State, Local not specified - PPO | Admitting: Family Medicine

## 2018-03-14 ENCOUNTER — Encounter: Payer: Self-pay | Admitting: Family Medicine

## 2018-03-14 VITALS — BP 108/68 | HR 84 | Temp 98.4°F | Ht 63.0 in | Wt 168.4 lb

## 2018-03-14 DIAGNOSIS — Z0000 Encounter for general adult medical examination without abnormal findings: Secondary | ICD-10-CM

## 2018-03-14 LAB — COMPREHENSIVE METABOLIC PANEL
ALK PHOS: 94 U/L (ref 39–117)
ALT: 23 U/L (ref 0–35)
AST: 21 U/L (ref 0–37)
Albumin: 4.4 g/dL (ref 3.5–5.2)
BUN: 17 mg/dL (ref 6–23)
CO2: 28 meq/L (ref 19–32)
Calcium: 11.4 mg/dL — ABNORMAL HIGH (ref 8.4–10.5)
Chloride: 103 mEq/L (ref 96–112)
Creatinine, Ser: 0.74 mg/dL (ref 0.40–1.20)
GFR: 109.84 mL/min (ref >60.00)
GLUCOSE: 92 mg/dL (ref 70–99)
Potassium: 4.2 mEq/L (ref 3.5–5.1)
SODIUM: 138 meq/L (ref 135–145)
TOTAL PROTEIN: 8.3 g/dL (ref 6.0–8.3)
Total Bilirubin: 0.5 mg/dL (ref 0.2–1.2)

## 2018-03-14 LAB — CBC
HCT: 41 % (ref 36.0–46.0)
Hemoglobin: 13.7 g/dL (ref 12.0–15.0)
MCHC: 33.3 g/dL (ref 30.0–36.0)
MCV: 85.2 fl (ref 78.0–100.0)
Platelets: 264 10*3/uL (ref 150.0–400.0)
RBC: 4.81 Mil/uL (ref 3.87–5.11)
RDW: 14 % (ref 11.5–15.5)
WBC: 5.6 10*3/uL (ref 4.0–10.5)

## 2018-03-14 LAB — LIPID PANEL
CHOL/HDL RATIO: 3
Cholesterol: 248 mg/dL — ABNORMAL HIGH (ref 0–200)
HDL: 87.5 mg/dL (ref >39.00)
LDL Cholesterol: 150 mg/dL — ABNORMAL HIGH (ref 0–99)
NONHDL: 160.98
Triglycerides: 54 mg/dL (ref 0.0–149.0)
VLDL: 10.8 mg/dL (ref 0.0–40.0)

## 2018-03-14 NOTE — Progress Notes (Signed)
Chief Complaint  Patient presents with  . Annual Exam     Well Woman Kimberly Richards is here for a complete physical.   Her last physical was >1 year ago.  Current diet: in general, a "healthy" diet. Current exercise: Some walking Weight is stable and she denies daytime fatigue. No LMP recorded. Patient has had a hysterectomy..  Seatbelt? Yes  Health Maintenance Mammogram- Yes Tetanus- Yes HIV screening- Yes  Pt does not have cervix, follows with GYN  Past Medical History:  Diagnosis Date  . Abnormal Pap smear of cervix   . Anemia   . Anxiety with depression 12/18/2017  . H/O ovarian cancer   . HPV (human papilloma virus) anogenital infection   . Ovarian cyst      Past Surgical History:  Procedure Laterality Date  . ABDOMINAL HYSTERECTOMY    . BREAST BIOPSY Right   . CESAREAN SECTION     x3  . LAPAROSCOPIC HYSTERECTOMY     total  . LAPAROTOMY    . Ovarian mass removed  12/06/2011   Borderline Ovarian Cancer    Medications  Current Outpatient Medications on File Prior to Visit  Medication Sig Dispense Refill  . B Complex-C-Folic Acid (HM VITAMIN B COMPLEX/VITAMIN C PO) Take 1 tablet by mouth 2 (two) times daily.     . Cholecalciferol (VITAMIN D3) 5000 units CAPS Take 1 capsule by mouth daily.    Marland Kitchen estradiol (ESTRACE VAGINAL) 0.1 MG/GM vaginal cream Use .5 grams 3x/week 42.5 g 12  . Melatonin 5 MG TABS Take 1 tablet by mouth at bedtime.    . minocycline (DYNACIN) 100 MG tablet Take 1 tablet (100 mg total) by mouth 2 (two) times daily. 60 tablet 1  . Multiple Vitamin (MULTIVITAMIN) tablet Take 1 tablet by mouth 2 (two) times daily.     . sertraline (ZOLOFT) 50 MG tablet Take 1 tablet (50 mg total) by mouth daily. 90 tablet 1   Allergies Allergies  Allergen Reactions  . Stadol [Butorphanol]     Review of Systems: Constitutional:  no unexpected weight changes Eye:  no recent significant change in vision Ear/Nose/Mouth/Throat:  Ears:  no tinnitus or vertigo  and no recent change in hearing Nose/Mouth/Throat:  no complaints of nasal congestion, no sore throat Cardiovascular: no chest pain Respiratory:  no cough and no shortness of breath Gastrointestinal:  no abdominal pain, no change in bowel habits GU:  Female: negative for dysuria or pelvic pain Musculoskeletal/Extremities:  no pain of the joints Integumentary (Skin/Breast):  no abnormal skin lesions reported Neurologic:  no headaches Endocrine:  denies fatigue Hematologic/Lymphatic:  No areas of easy bleeding  Exam BP 108/68 (BP Location: Left Arm, Patient Position: Sitting, Cuff Size: Normal)   Pulse 84   Temp 98.4 F (36.9 C) (Oral)   Ht 5\' 3"  (1.6 m)   Wt 168 lb 6 oz (76.4 kg)   SpO2 98%   BMI 29.83 kg/m  General:  well developed, well nourished, in no apparent distress Skin:  no significant moles, warts, or growths Head:  no masses, lesions, or tenderness Eyes:  pupils equal and round, sclera anicteric without injection Ears:  canals without lesions, TMs shiny without retraction, no obvious effusion, no erythema Nose:  nares patent, septum midline, mucosa normal, and no drainage or sinus tenderness Throat/Pharynx:  lips and gingiva without lesion; tongue and uvula midline; non-inflamed pharynx; no exudates or postnasal drainage Neck: neck supple without adenopathy, thyromegaly, or masses Lungs:  clear to auscultation, breath  sounds equal bilaterally, no respiratory distress Cardio:  regular rate and rhythm, no bruits, no LE edema Abdomen:  abdomen soft, nontender; bowel sounds normal; no masses or organomegaly Genital: Defer to GYN Musculoskeletal:  symmetrical muscle groups noted without atrophy or deformity Extremities:  no clubbing, cyanosis, or edema, no deformities, no skin discoloration Neuro:  gait normal; deep tendon reflexes normal and symmetric Psych: well oriented with normal range of affect and appropriate judgment/insight  Assessment and Plan  Well adult exam  - Plan: CBC, Comprehensive metabolic panel, Lipid panel   Well 42 y.o. female. Counseled on diet and exercise. Needs to get back on wagon. Letter for work given excusing. Other orders as above. Mentioned she is doing well on Zoloft, wants to come off. Will remove from list, she will let us know if she would like to go back on it.  Follow up in 1 yr for CPE. The patient voiced understanding and agreement to the plan.  [place], DO 03/14/18 7:35 AM

## 2018-03-14 NOTE — Patient Instructions (Addendum)
Give [place] 2-3 business days to get the results of your labs back.  Aim to do some physical exertion for 150 minutes per week. This is typically divided into 5 days per week, 30 minutes per day. The activity should be enough to get your heart rate up. Anything is better than nothing if you have time constraints.  Keep the diet clean.  Let us know if you need anything.

## 2018-03-14 NOTE — Progress Notes (Signed)
Pre visit review using our clinic review tool, if applicable. No additional management support is needed unless otherwise documented below in the visit note. 

## 2018-03-15 ENCOUNTER — Other Ambulatory Visit (INDEPENDENT_AMBULATORY_CARE_PROVIDER_SITE_OTHER): Payer: Federal, State, Local not specified - PPO

## 2018-03-15 ENCOUNTER — Other Ambulatory Visit: Payer: Self-pay | Admitting: Family Medicine

## 2018-03-15 LAB — COMPREHENSIVE METABOLIC PANEL
AG RATIO: 1.3 (calc) (ref 1.0–2.5)
ALT: 19 U/L (ref 6–29)
AST: 18 U/L (ref 10–30)
Albumin: 4.4 g/dL (ref 3.6–5.1)
Alkaline phosphatase (APISO): 111 U/L (ref 33–115)
BUN: 19 mg/dL (ref 7–25)
CHLORIDE: 102 mmol/L (ref 98–110)
CO2: 25 mmol/L (ref 20–32)
Calcium: 11.2 mg/dL — ABNORMAL HIGH (ref 8.6–10.2)
Creat: 0.77 mg/dL (ref 0.50–1.10)
GLOBULIN: 3.3 g/dL (ref 1.9–3.7)
GLUCOSE: 92 mg/dL (ref 65–99)
Potassium: 4.3 mmol/L (ref 3.5–5.3)
SODIUM: 137 mmol/L (ref 135–146)
TOTAL PROTEIN: 7.7 g/dL (ref 6.1–8.1)
Total Bilirubin: 0.4 mg/dL (ref 0.2–1.2)

## 2018-03-15 NOTE — Addendum Note (Signed)
Addended by: Caffie Pinto on: 03/15/2018 02:10 PM   Modules accepted: Orders

## 2018-03-17 ENCOUNTER — Other Ambulatory Visit: Payer: Self-pay | Admitting: Family Medicine

## 2018-03-18 ENCOUNTER — Encounter: Payer: Self-pay | Admitting: Family Medicine

## 2018-03-20 ENCOUNTER — Other Ambulatory Visit (INDEPENDENT_AMBULATORY_CARE_PROVIDER_SITE_OTHER): Payer: Federal, State, Local not specified - PPO

## 2018-03-21 LAB — PARATHYROID HORMONE, INTACT (NO CA): PTH: 38 pg/mL (ref 14–64)

## 2018-03-22 ENCOUNTER — Other Ambulatory Visit: Payer: Self-pay | Admitting: Family Medicine

## 2018-03-22 DIAGNOSIS — E210 Primary hyperparathyroidism: Secondary | ICD-10-CM

## 2018-03-22 NOTE — Telephone Encounter (Signed)
Tried to contact pt regarding her results and HPTH dx and to discuss next steps. LVM. Will hopefully discuss in person, not urgent/emergent. TY.

## 2018-03-22 NOTE — Telephone Encounter (Signed)
Patient called and states she missed a call from Dr. Nani Ravens her best contact # is [phone] -

## 2018-03-22 NOTE — Progress Notes (Signed)
Referral for ENT placed. Pt with nml PTH in setting of hypercalcemia confirms dx. Given her age, will refer for discussion of surgical resection. Pt notified via telephone and in agreement with plan. All questions answered.

## 2018-03-26 ENCOUNTER — Encounter: Payer: Self-pay | Admitting: Family Medicine

## 2018-04-11 ENCOUNTER — Other Ambulatory Visit (HOSPITAL_BASED_OUTPATIENT_CLINIC_OR_DEPARTMENT_OTHER): Payer: Self-pay | Admitting: Otolaryngology

## 2018-04-11 ENCOUNTER — Ambulatory Visit (HOSPITAL_BASED_OUTPATIENT_CLINIC_OR_DEPARTMENT_OTHER)
Admission: RE | Admit: 2018-04-11 | Discharge: 2018-04-11 | Disposition: A | Discharge status: Home / Self Care | Payer: Federal, State, Local not specified - PPO | Source: Ambulatory Visit | Attending: Otolaryngology | Admitting: Otolaryngology

## 2018-04-11 DIAGNOSIS — R9389 Abnormal findings on diagnostic imaging of other specified body structures: Secondary | ICD-10-CM | POA: Diagnosis not present

## 2018-04-11 DIAGNOSIS — E210 Primary hyperparathyroidism: Secondary | ICD-10-CM

## 2018-04-13 ENCOUNTER — Other Ambulatory Visit: Payer: Self-pay | Admitting: Otolaryngology

## 2018-04-13 DIAGNOSIS — E210 Primary hyperparathyroidism: Secondary | ICD-10-CM

## 2018-04-17 ENCOUNTER — Other Ambulatory Visit: Payer: Self-pay | Admitting: Otolaryngology

## 2018-04-17 DIAGNOSIS — E213 Hyperparathyroidism, unspecified: Secondary | ICD-10-CM

## 2018-05-03 ENCOUNTER — Other Ambulatory Visit: Payer: Self-pay | Admitting: Otolaryngology

## 2018-05-03 DIAGNOSIS — E213 Hyperparathyroidism, unspecified: Secondary | ICD-10-CM

## 2018-05-09 ENCOUNTER — Ambulatory Visit
Admission: RE | Admit: 2018-05-09 | Discharge: 2018-05-09 | Disposition: A | Discharge status: Home / Self Care | Payer: Federal, State, Local not specified - PPO | Source: Ambulatory Visit | Attending: Otolaryngology | Admitting: Otolaryngology

## 2018-05-09 DIAGNOSIS — E213 Hyperparathyroidism, unspecified: Secondary | ICD-10-CM

## 2018-05-09 MED ORDER — IOPAMIDOL (ISOVUE-300) INJECTION 61%
75.0000 mL | Freq: Once | INTRAVENOUS | Status: AC | PRN
  Administered 2018-05-09: 75 mL via INTRAVENOUS

## 2018-08-06 ENCOUNTER — Encounter: Payer: Self-pay | Admitting: Family Medicine

## 2018-08-15 ENCOUNTER — Ambulatory Visit: Payer: Federal, State, Local not specified - PPO | Admitting: Family Medicine

## 2018-08-15 ENCOUNTER — Encounter: Payer: Self-pay | Admitting: Family Medicine

## 2018-08-15 VITALS — BP 118/68 | HR 78 | Temp 98.0°F | Ht 63.0 in | Wt 181.5 lb

## 2018-08-15 DIAGNOSIS — L709 Acne, unspecified: Secondary | ICD-10-CM

## 2018-08-15 NOTE — Progress Notes (Signed)
Chief Complaint  Patient presents with  . Acne    Kimberly Richards is a 43 y.o. female here for a skin complaint.  Continuing to have acne. Stopped using benzoyl peroxide.  Location: face Starting to be painful.  Failed topical clindamycin, oral minocycline, benzyl peroxide cleansers.  ROS:  Const: No fevers Skin: As noted in HPI  Past Medical History:  Diagnosis Date  . Abnormal Pap smear of cervix   . Anemia   . Anxiety with depression 12/18/2017  . H/O ovarian cancer   . HPV (human papilloma virus) anogenital infection   . Ovarian cyst     BP 118/68 (BP Location: Left Arm, Patient Position: Sitting, Cuff Size: Normal)   Pulse 78   Temp 98 F (36.7 C) (Oral)   Ht 5\' 3"  (1.6 m)   Wt 181 lb 8 oz (82.3 kg)   SpO2 98%   BMI 32.15 kg/m  Gen: awake, alert, appearing stated age Lungs: No accessory muscle use Skin: See below. Psych: Age appropriate judgment and insight      Acne, unspecified acne type - Plan: Ambulatory referral to Dermatology  The next step would likely be an oral retinoid, will refer to dermatology to initiate this or trial another option.  I did recommend benzoyl peroxide washes until she is able to get in. F/u prn. The patient voiced understanding and agreement to the plan.  [place], DO 08/15/18 4:11 PM

## 2018-08-15 NOTE — Progress Notes (Signed)
Pre visit review using our clinic review tool, if applicable. No additional management support is needed unless otherwise documented below in the visit note. 

## 2018-08-15 NOTE — Patient Instructions (Signed)
If you do not hear anything about your referral in the next 1-2 weeks, call our office and ask for an update.  Use benzoyl peroxide daily until you are seen.  Let us know if you need anything.

## 2018-08-22 ENCOUNTER — Other Ambulatory Visit: Payer: Self-pay | Admitting: Family Medicine

## 2018-08-22 ENCOUNTER — Encounter: Payer: Self-pay | Admitting: Family Medicine

## 2018-08-28 ENCOUNTER — Other Ambulatory Visit: Payer: Self-pay | Admitting: Family Medicine

## 2018-08-28 DIAGNOSIS — L709 Acne, unspecified: Secondary | ICD-10-CM

## 2018-08-28 MED ORDER — SPIRONOLACTONE 50 MG PO TABS: 50.0000 mg | ORAL_TABLET | Freq: Every day | ORAL | 3 refills | Status: DC

## 2018-08-28 MED FILL — SPIRONOLACTONE 50 MG TABLET: 50 | 30 days supply | Qty: 30 | Fill #0

## 2018-08-28 NOTE — Progress Notes (Signed)
Call center informed the patient of PCP instructions.

## 2018-08-28 NOTE — Progress Notes (Signed)
Pt is to take 25 mg for 1st 2 weeks and then a full tab, ck BMP in 4 weeks, please schedule. Order placed. TY.

## 2018-08-28 NOTE — Progress Notes (Signed)
Called left message to call back 

## 2018-09-30 MED FILL — SPIRONOLACTONE 50 MG TABLET: 50 | 30 days supply | Qty: 30 | Fill #1

## 2018-10-29 MED FILL — SPIRONOLACTONE 50 MG TABLET: 50 | 30 days supply | Qty: 30 | Fill #2

## 2018-11-12 ENCOUNTER — Telehealth: Payer: Federal, State, Local not specified - PPO | Admitting: Nurse Practitioner

## 2018-11-12 DIAGNOSIS — B001 Herpesviral vesicular dermatitis: Secondary | ICD-10-CM

## 2018-11-12 MED ORDER — VALACYCLOVIR HCL 1 G PO TABS: ORAL_TABLET | ORAL | 0 refills | Status: DC

## 2018-11-12 NOTE — Progress Notes (Signed)
We are sorry that you are not feeling well.  Here is how we plan to help!  Based on what you have shared with me it does look like you have a viral infection.    Most cold sores or fever blisters are small fluid filled blisters around the mouth caused by herpes simplex virus.  The most common strain of the virus causing cold sores is herpes simplex virus 1.  It can be spread by skin contact, sharing eating utensils, or even sharing towels.  Cold sores are contagious to other people until dry. (Approximately 5-7 days).  Wash your hands. You can spread the virus to your eyes through handling your contact lenses after touching the lesions.  Most people experience pain at the sight or tingling sensations in their lips that may begin before the ulcers erupt.  Herpes simplex is treatable but not curable.  It may lie dormant for a long time and then reappear due to stress or prolonged sun exposure.  Many patients have success in treating their cold sores with an over the counter topical called Abreva.  You may apply the cream up to 5 times daily (maximum 10 days) until healing occurs.  If you would like to use an oral antiviral medication to speed the healing of your cold sore, I have sent a prescription to your local pharmacy Valacyclovir 2 gm twice daily for 1 day    HOME CARE:   Wash your hands frequently.  Do not pick at or rub the sore.  Don't open the blisters.  Avoid kissing other people during this time.  Avoid sharing drinking glasses, eating utensils, or razors.  Do not handle contact lenses unless you have thoroughly washed your hands with soap and warm water!  Avoid oral sex during this time.  Herpes from sores on your mouth can spread to your partner's genital area.  Avoid contact with anyone who has eczema or a weakened immune system.  Cold sores are often triggered by exposure to intense sunlight, use a lip balm containing a sunscreen (SPF 30 or higher).  GET HELP RIGHT AWAY  IF:   Blisters look infected.  Blisters occur near or in the eye.  Symptoms last longer than 10 days.  Your symptoms become worse.  MAKE SURE YOU:   Understand these instructions.  Will watch your condition.  Will get help right away if you are not doing well or get worse.    Your e-visit answers were reviewed by a board certified advanced clinical practitioner to complete your personal care plan.  Depending upon the condition, your plan could have  Included both over the counter or prescription medications.    Please review your pharmacy choice.  Be sure that the pharmacy you have chosen is open so that you can pick up your prescription now.  If there is a problem you can message your provider in [place] to have the prescription routed to another pharmacy.    Your safety is important to [place].  If you have drug allergies check our prescription carefully.  For the next 24 hours you can use MyChart to ask questions about today's visit, request a non-urgent call back, or ask for a work or school excuse from your e-visit provider.  You will get an email in the next two days asking about your experience.  I hope that your e-visit has been valuable and will speed your recovery.  5 minutes spent reviewing and documenting in chart.

## 2018-12-02 ENCOUNTER — Encounter: Payer: Self-pay | Admitting: Family Medicine

## 2018-12-02 MED ORDER — SPIRONOLACTONE 50 MG PO TABS: 50.0000 mg | ORAL_TABLET | Freq: Every day | ORAL | 3 refills | Status: DC

## 2018-12-02 MED FILL — SPIRONOLACTONE 50 MG TABLET: 50 | 30 days supply | Qty: 30 | Fill #3

## 2019-01-15 DIAGNOSIS — L810 Postinflammatory hyperpigmentation: Secondary | ICD-10-CM | POA: Diagnosis not present

## 2019-01-15 DIAGNOSIS — L700 Acne vulgaris: Secondary | ICD-10-CM | POA: Diagnosis not present

## 2019-02-20 ENCOUNTER — Other Ambulatory Visit (HOSPITAL_BASED_OUTPATIENT_CLINIC_OR_DEPARTMENT_OTHER): Payer: Self-pay | Admitting: Family Medicine

## 2019-02-20 DIAGNOSIS — Z1231 Encounter for screening mammogram for malignant neoplasm of breast: Secondary | ICD-10-CM

## 2019-02-26 ENCOUNTER — Ambulatory Visit (HOSPITAL_BASED_OUTPATIENT_CLINIC_OR_DEPARTMENT_OTHER)
Admission: RE | Admit: 2019-02-26 | Discharge: 2019-02-26 | Disposition: A | Discharge status: Home / Self Care | Payer: Federal, State, Local not specified - PPO | Source: Ambulatory Visit | Attending: Family Medicine | Admitting: Family Medicine

## 2019-02-26 ENCOUNTER — Other Ambulatory Visit: Payer: Self-pay

## 2019-02-26 DIAGNOSIS — Z1231 Encounter for screening mammogram for malignant neoplasm of breast: Secondary | ICD-10-CM | POA: Diagnosis not present

## 2019-03-17 ENCOUNTER — Other Ambulatory Visit: Payer: Self-pay

## 2019-03-17 ENCOUNTER — Encounter: Payer: Self-pay | Admitting: Family Medicine

## 2019-03-17 ENCOUNTER — Ambulatory Visit (INDEPENDENT_AMBULATORY_CARE_PROVIDER_SITE_OTHER): Payer: Federal, State, Local not specified - PPO | Admitting: Family Medicine

## 2019-03-17 VITALS — BP 108/68 | HR 85 | Temp 98.4°F | Ht 63.0 in | Wt 182.4 lb

## 2019-03-17 DIAGNOSIS — Z0000 Encounter for general adult medical examination without abnormal findings: Secondary | ICD-10-CM

## 2019-03-17 DIAGNOSIS — R300 Dysuria: Secondary | ICD-10-CM | POA: Diagnosis not present

## 2019-03-17 DIAGNOSIS — E210 Primary hyperparathyroidism: Secondary | ICD-10-CM | POA: Insufficient documentation

## 2019-03-17 DIAGNOSIS — Z0001 Encounter for general adult medical examination with abnormal findings: Secondary | ICD-10-CM | POA: Diagnosis not present

## 2019-03-17 DIAGNOSIS — F419 Anxiety disorder, unspecified: Secondary | ICD-10-CM | POA: Diagnosis not present

## 2019-03-17 DIAGNOSIS — R339 Retention of urine, unspecified: Secondary | ICD-10-CM

## 2019-03-17 DIAGNOSIS — N390 Urinary tract infection, site not specified: Secondary | ICD-10-CM | POA: Diagnosis not present

## 2019-03-17 LAB — CBC
HCT: 40.3 % (ref 36.0–46.0)
Hemoglobin: 13.5 g/dL (ref 12.0–15.0)
MCHC: 33.6 g/dL (ref 30.0–36.0)
MCV: 87.7 fl (ref 78.0–100.0)
Platelets: 304 10*3/uL (ref 150.0–400.0)
RBC: 4.59 Mil/uL (ref 3.87–5.11)
RDW: 13 % (ref 11.5–15.5)
WBC: 4.8 10*3/uL (ref 4.0–10.5)

## 2019-03-17 LAB — URINALYSIS
Bilirubin Urine: NEGATIVE
Hgb urine dipstick: NEGATIVE
Ketones, ur: NEGATIVE
Leukocytes,Ua: NEGATIVE
Nitrite: NEGATIVE
Specific Gravity, Urine: 1.025 (ref 1.000–1.030)
Total Protein, Urine: NEGATIVE
Urine Glucose: NEGATIVE
Urobilinogen, UA: 0.2 (ref 0.0–1.0)
pH: 6 (ref 5.0–8.0)

## 2019-03-17 LAB — LIPID PANEL
Cholesterol: 192 mg/dL (ref 0–200)
HDL: 66.5 mg/dL (ref >39.00)
LDL Cholesterol: 111 mg/dL — ABNORMAL HIGH (ref 0–99)
NonHDL: 125.56
Total CHOL/HDL Ratio: 3
Triglycerides: 75 mg/dL (ref 0.0–149.0)
VLDL: 15 mg/dL (ref 0.0–40.0)

## 2019-03-17 LAB — COMPREHENSIVE METABOLIC PANEL
ALT: 21 U/L (ref 0–35)
AST: 22 U/L (ref 0–37)
Albumin: 4.4 g/dL (ref 3.5–5.2)
Alkaline Phosphatase: 80 U/L (ref 39–117)
BUN: 12 mg/dL (ref 6–23)
CO2: 28 mEq/L (ref 19–32)
Calcium: 11.1 mg/dL — ABNORMAL HIGH (ref 8.4–10.5)
Chloride: 104 mEq/L (ref 96–112)
Creatinine, Ser: 0.83 mg/dL (ref 0.40–1.20)
GFR: 90.1 mL/min (ref >60.00)
Glucose, Bld: 89 mg/dL (ref 70–99)
Potassium: 4.5 mEq/L (ref 3.5–5.1)
Sodium: 139 mEq/L (ref 135–145)
Total Bilirubin: 0.4 mg/dL (ref 0.2–1.2)
Total Protein: 7.8 g/dL (ref 6.0–8.3)

## 2019-03-17 MED ORDER — CLONAZEPAM 0.5 MG PO TABS: 0.2500 mg | ORAL_TABLET | Freq: Two times a day (BID) | ORAL | 0 refills | Status: DC | PRN

## 2019-03-17 NOTE — Progress Notes (Signed)
Chief Complaint  Patient presents with  . Annual Exam     Well Woman Kimberly Richards is here for a complete physical.   Her last physical was >1 year ago.  Current diet: in general, a "healthy" diet. Current exercise: elliptical machine. Weight is up a little and she denies daytime fatigue. No LMP recorded. Patient has had a hysterectomy.  Seatbelt? Yes   Over past several mo, has been having more anxiety. 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 She gets most anxious after work. Sometimes she will have to step away from her family. Has issues sleeping as well. Trying to get set up w counselor, would prefer prn med rather than SSRI.  Over past 2-3 weeks, has had issues with incomplete emptying of bladder. No fevers, bleeding, pain, constipation. Maybe eats a lot of acidic veggies. One coffee daily.   Health Maintenance Mammogram- Yes Tetanus- Yes HIV screening- Yes  Past Medical History:  Diagnosis Date  . Abnormal Pap smear of cervix   . Anemia   . Anxiety with depression 12/18/2017  . H/O ovarian cancer   . HPV (human papilloma virus) anogenital infection   . Ovarian cyst      Past Surgical History:  Procedure Laterality Date  . ABDOMINAL HYSTERECTOMY    . BREAST BIOPSY Right   . CESAREAN SECTION     x3  . LAPAROSCOPIC HYSTERECTOMY     total  . LAPAROTOMY    . Ovarian mass removed  12/06/2011   Borderline Ovarian Cancer    Medications  Current Outpatient Medications on File Prior to Visit  Medication Sig Dispense Refill  . B Complex-C-Folic Acid (HM VITAMIN B COMPLEX/VITAMIN C PO) Take 1 tablet by mouth 2 (two) times daily.     . Cholecalciferol (VITAMIN D3) 5000 units CAPS Take 1 capsule by mouth daily.    Marland Kitchen estradiol (ESTRACE VAGINAL) 0.1 MG/GM vaginal cream Use .5 grams 3x/week 42.5 g 12  . Melatonin 5 MG TABS Take 1 tablet by mouth at bedtime.    . Multiple Vitamin (MULTIVITAMIN) tablet Take 1 tablet by mouth 2 (two) times daily.     Marland Kitchen  spironolactone (ALDACTONE) 50 MG tablet Take 1 tablet (50 mg total) by mouth daily. 30 tablet 3  . valACYclovir (VALTREX) 1000 MG tablet 2 po bid x1 day 4 tablet 0   Allergies Allergies  Allergen Reactions  . Stadol [Butorphanol]     Review of Systems: Constitutional:  no unexpected weight changes Eye:  no recent significant change in vision Ear/Nose/Mouth/Throat:  Ears:  no tinnitus or vertigo and no recent change in hearing Nose/Mouth/Throat:  no complaints of nasal congestion, no sore throat Cardiovascular: no chest pain Respiratory:  no cough and no shortness of breath Gastrointestinal:  no abdominal pain, no change in bowel habits GU:  Female: negative for dysuria or pelvic pain, +incomplete emptying Musculoskeletal/Extremities:  no pain of the joints Integumentary (Skin/Breast):  no abnormal skin lesions reported Neurologic:  no headaches Endocrine:  denies fatigue Psych: +anxiety  Exam BP 108/68 (BP Location: Left Arm, Patient Position: Sitting, Cuff Size: Large)   Pulse 85   Temp 98.4 F (36.9 C) (Oral)   Ht 5\' 3"  (1.6 m)   Wt 182 lb 6 oz (82.7 kg)   SpO2 97%   BMI 32.31 kg/m  General:  well developed, well nourished, in no apparent distress Skin:  no significant moles, warts, or growths Head:  no masses, lesions, or tenderness Eyes:  pupils equal and round, sclera anicteric without injection Ears:  canals without lesions, TMs shiny without retraction, no obvious effusion, no erythema Nose:  nares patent, septum midline, mucosa normal, and no drainage or sinus tenderness Throat/Pharynx:  lips and gingiva without lesion; tongue and uvula midline; non-inflamed pharynx; no exudates or postnasal drainage Neck: neck supple without adenopathy, thyromegaly, or masses Lungs:  clear to auscultation, breath sounds equal bilaterally, no respiratory distress Cardio:  regular rate and rhythm, no bruits, no LE edema Abdomen:  abdomen soft, nontender; bowel sounds normal; no  masses or organomegaly Genital: Defer to GYN Musculoskeletal:  symmetrical muscle groups noted without atrophy or deformity Extremities:  no clubbing, cyanosis, or edema, no deformities, no skin discoloration Neuro:  gait normal; deep tendon reflexes normal and symmetric Psych: well oriented with normal range of affect and appropriate judgment/insight  Assessment and Plan  Well adult exam - Plan: CBC, Comprehensive metabolic panel, Lipid panel  Anxiety - Plan: start prn Klonopin as she has been on before. Does not want to go on daily med at this time. She is in process of setting up w counselor but pandemic is causing barriers to this. [facility] info given.   Incomplete emptying of bladder - Plan: Urinalysis, Urine Culture, if neg, she will try to cut down on acidic foods as possible irritant. Will refer to urology team in 1 week if no improvement.   Well 43 y.o. female. Counseled on diet and exercise. Other orders as above. Follow up in 1 yr or prn. The patient voiced understanding and agreement to the plan.  [place], DO 03/17/19 7:55 AM

## 2019-03-17 NOTE — Patient Instructions (Addendum)
Give [place] 2-3 business days to get the results of your labs back.   Keep the diet clean and stay active. Cut down on acidic foods. We will be in touch regarding your urine sample. If no improvement, we will refer you to the urology team.   Please consider counseling. Contact [phone] to schedule an appointment or inquire about cost/insurance coverage.  Sleep Hygiene Tips:  Do not watch TV or look at screens within 1 hour of going to bed. If you do, make sure there is a blue light filter (nighttime mode) involved.  Try to go to bed around the same time every night. Wake up at the same time within 1 hour of regular time. Ex: If you wake up at 7 AM for work, do not sleep past 8 AM on days that you don't work.  Do not drink alcohol before bedtime.  Do not consume caffeine-containing beverages after noon or within 9 hours of intended bedtime.  Get regular exercise/physical activity in your life, but not within 2 hours of planned bedtime.  Do not take naps.   Do not eat within 2 hours of planned bedtime.  Melatonin, 3-5 mg 30-60 minutes before planned bedtime may be helpful.   The bed should be for sleep or sex only. If after 20-30 minutes you are unable to fall asleep, get up and do something relaxing. Do this until you feel ready to go to sleep again.   Let us know if you need anything.

## 2019-03-19 ENCOUNTER — Other Ambulatory Visit: Payer: Self-pay | Admitting: Family Medicine

## 2019-03-19 LAB — URINE CULTURE
MICRO NUMBER:: 753953
SPECIMEN QUALITY:: ADEQUATE

## 2019-03-19 MED ORDER — SULFAMETHOXAZOLE-TRIMETHOPRIM 800-160 MG PO TABS: 1.0000 | ORAL_TABLET | Freq: Two times a day (BID) | ORAL | 0 refills | Status: AC

## 2019-03-20 ENCOUNTER — Other Ambulatory Visit: Payer: Federal, State, Local not specified - PPO

## 2019-04-07 ENCOUNTER — Ambulatory Visit: Payer: Federal, State, Local not specified - PPO | Admitting: Obstetrics & Gynecology

## 2019-04-09 ENCOUNTER — Ambulatory Visit (INDEPENDENT_AMBULATORY_CARE_PROVIDER_SITE_OTHER): Payer: Federal, State, Local not specified - PPO | Admitting: Obstetrics & Gynecology

## 2019-04-09 ENCOUNTER — Other Ambulatory Visit: Payer: Self-pay

## 2019-04-09 ENCOUNTER — Encounter: Payer: Self-pay | Admitting: Obstetrics & Gynecology

## 2019-04-09 VITALS — BP 102/65 | HR 66 | Ht 63.0 in | Wt 182.0 lb

## 2019-04-09 DIAGNOSIS — Z01419 Encounter for gynecological examination (general) (routine) without abnormal findings: Secondary | ICD-10-CM | POA: Diagnosis not present

## 2019-04-09 DIAGNOSIS — Z8543 Personal history of malignant neoplasm of ovary: Secondary | ICD-10-CM | POA: Diagnosis not present

## 2019-04-09 NOTE — Progress Notes (Signed)
Subjective:     Kimberly Richards is a 43 y.o. female here for a routine exam.  Current complaints: none. Pt denies abd pain, or GI sx. She reports that she has moved to a plant based diet.      Gynecologic History No LMP recorded. Patient has had a hysterectomy. Contraception: status post hysterectomy Last Pap: n/a  Last mammogram: 02/26/2019. Results were: normal  Obstetric History OB History  Gravida Para Term Preterm AB Living  3 3 3     3   SAB TAB Ectopic Multiple Live Births               # Outcome Date GA Lbr Len/2nd Weight Sex Delivery Anes PTL Lv  3 Term      CS-Unspec     2 Term      CS-Unspec     1 Term      CS-Unspec        The following portions of the patient's history were reviewed and updated as appropriate: allergies, current medications, past family history, past medical history, past social history, past surgical history and problem list.  Review of Systems Pertinent items are noted in HPI.    Objective:  BP 102/65   Pulse 66   Ht 5\' 3"  (1.6 m)   Wt 182 lb (82.6 kg)   BMI 32.24 kg/m      General Appearance:    Alert, cooperative, no distress, appears stated age  Head:    Normocephalic, without obvious abnormality, atraumatic  Eyes:    conjunctiva/corneas clear, EOM's intact, both eyes  Ears:    Normal external ear canals, both ears  Nose:   Nares normal, septum midline, mucosa normal, no drainage    or sinus tenderness  Throat:   Lips, mucosa, and tongue normal; teeth and gums normal  Neck:   Supple, symmetrical, trachea midline, no adenopathy;    thyroid:  no enlargement/tenderness/nodules  Back:     Symmetric, no curvature, ROM normal, no CVA tenderness  Lungs:     Clear to auscultation bilaterally, respirations unlabored  Chest Wall:    No tenderness or deformity   Heart:    Regular rate and rhythm, S1 and S2 normal, no murmur, rub   or gallop  Breast Exam:    No tenderness, masses, or nipple abnormality  Abdomen:     Soft, non-tender, bowel  sounds active all four quadrants,    no masses, no organomegaly  Genitalia:    Normal female without lesion, discharge or tenderness     Extremities:   Extremities normal, atraumatic, no cyanosis or edema  Pulses:   2+ and symmetric all extremities  Skin:   Skin color, texture, turgor normal, no rashes or lesions    Assessment:    Healthy female exam.   H/o Ovarian ca- last CA125 was 1 year prev.     Plan:    Follow up in: 1 year.  f/u sooner prn  Annual mammogram  Needs CA125 annually.   [name] L. Harraway-Smith, M.D., Cherlynn June

## 2019-04-10 ENCOUNTER — Telehealth: Payer: Self-pay

## 2019-04-10 NOTE — Telephone Encounter (Signed)
Left message for patient to return call to office.  Needs lab visit scheduled for Ca-125 Kathrene Alu RN

## 2019-04-10 NOTE — Telephone Encounter (Signed)
-----   Message from Lavonia Drafts, MD sent at 04/09/2019  2:45 PM EDT ----- Please call pt. We should get a CA125 this year. She can f/u to have her lab drawn.   Thx,  Clh-S

## 2019-04-16 ENCOUNTER — Other Ambulatory Visit: Payer: Self-pay

## 2019-04-16 ENCOUNTER — Ambulatory Visit: Payer: Federal, State, Local not specified - PPO

## 2019-04-16 DIAGNOSIS — Z8543 Personal history of malignant neoplasm of ovary: Secondary | ICD-10-CM

## 2019-04-16 DIAGNOSIS — Z01419 Encounter for gynecological examination (general) (routine) without abnormal findings: Secondary | ICD-10-CM | POA: Diagnosis not present

## 2019-04-16 NOTE — Progress Notes (Signed)
Patient sent to lab for blood draw. Jennifer Howard RN 

## 2019-04-16 NOTE — Telephone Encounter (Signed)
Patient placed on today's schedule. Kathrene Alu RN

## 2019-04-17 LAB — CA 125: Cancer Antigen (CA) 125: 8.8 U/mL (ref 0.0–38.1)

## 2019-05-04 ENCOUNTER — Other Ambulatory Visit: Payer: Self-pay | Admitting: Family Medicine

## 2019-05-07 ENCOUNTER — Encounter: Payer: Self-pay | Admitting: Family Medicine

## 2019-05-07 MED ORDER — SPIRONOLACTONE 50 MG PO TABS: ORAL_TABLET | ORAL | 6 refills | Status: DC

## 2019-06-10 ENCOUNTER — Encounter: Payer: Self-pay | Admitting: Family Medicine

## 2019-06-10 ENCOUNTER — Other Ambulatory Visit: Payer: Self-pay

## 2019-06-10 ENCOUNTER — Ambulatory Visit: Payer: Federal, State, Local not specified - PPO | Admitting: Family Medicine

## 2019-06-10 VITALS — BP 102/70 | HR 81 | Temp 96.5°F | Ht 63.0 in | Wt 178.0 lb

## 2019-06-10 DIAGNOSIS — L03115 Cellulitis of right lower limb: Secondary | ICD-10-CM

## 2019-06-10 MED ORDER — ACETAMINOPHEN-CODEINE #3 300-30 MG PO TABS: 1.0000 | ORAL_TABLET | ORAL | 0 refills | Status: DC | PRN

## 2019-06-10 MED ORDER — FLUCONAZOLE 150 MG PO TABS: 150.0000 mg | ORAL_TABLET | Freq: Once | ORAL | 0 refills | Status: AC

## 2019-06-10 MED ORDER — DOXYCYCLINE HYCLATE 100 MG PO TABS: 100.0000 mg | ORAL_TABLET | Freq: Two times a day (BID) | ORAL | 0 refills | Status: AC

## 2019-06-10 NOTE — Patient Instructions (Signed)
Ice/cold pack over area for 10-15 min twice daily.  OK to take Tylenol 1000 mg (2 extra strength tabs) or 975 mg (3 regular strength tabs) every 6 hours as needed.  Ibuprofen 400-600 mg (2-3 over the counter strength tabs) every 6 hours as needed for pain.  Warm compresses for around 10-15 min twice daily.   Things to look out for: increasing pain not relieved by ibuprofen/acetaminophen, fevers, spreading redness, drainage of pus, or foul odor.  Let us know if you need anything.

## 2019-06-10 NOTE — Progress Notes (Signed)
Chief Complaint  Patient presents with  . right inner thigh ingrown hair/large and red    Kimberly Richards is a 43 y.o. female here for a skin complaint.  Duration: 2 days Location: R inner thigh Pruritic? No Painful? Yes Drainage? No New soaps/lotions/topicals/detergents? No Other associated symptoms: No fevers Therapies tried thus far: None  ROS:  Const: No fevers Skin: As noted in HPI  Past Medical History:  Diagnosis Date  . Abnormal Pap smear of cervix   . Anemia   . Anxiety with depression 12/18/2017  . H/O ovarian cancer   . HPV (human papilloma virus) anogenital infection   . Ovarian cyst   . Primary hyperparathyroidism (HCC)     BP 102/70 (BP Location: Left Arm, Patient Position: Sitting, Cuff Size: Normal)   Pulse 81   Temp (!) 96.5 F (35.8 C) (Temporal)   Ht 5\' 3"  (1.6 m)   Wt 178 lb (80.7 kg)   SpO2 98%   BMI 31.53 kg/m  Gen: awake, alert, appearing stated age Lungs: No accessory muscle use Skin: Examined in presence of female chaperone- central area of hyperpigmentation with surrounding erythema and warmth, there is minimal central fluctuance, most of skin is indurated. It is ttp. No drainage or excoriation Psych: Age appropriate judgment and insight  Cellulitis of right lower extremity - Plan: doxycycline (VIBRA-TABS) 100 MG tablet, fluconazole (DIFLUCAN) 150 MG tablet, acetaminophen-codeine (TYLENOL #3) 300-30 MG tablet  Warning signs and symptoms verbalized and written down in AVS.  Ice, warm compresses.  I don't think this requires I&D at this time.  F/u prn. The patient voiced understanding and agreement to the plan.  [place], DO 06/10/19 4:28 PM

## 2019-07-19 ENCOUNTER — Other Ambulatory Visit: Payer: Self-pay | Admitting: Family Medicine

## 2019-07-21 ENCOUNTER — Other Ambulatory Visit: Payer: Self-pay | Admitting: Family Medicine

## 2019-07-21 ENCOUNTER — Encounter: Payer: Self-pay | Admitting: Family Medicine

## 2019-07-21 MED ORDER — ESCITALOPRAM OXALATE 10 MG PO TABS: 10.0000 mg | ORAL_TABLET | Freq: Every day | ORAL | 2 refills | Status: DC

## 2019-07-21 MED ORDER — CLONAZEPAM 0.5 MG PO TABS: 0.2500 mg | ORAL_TABLET | Freq: Two times a day (BID) | ORAL | 0 refills | Status: DC | PRN

## 2019-08-04 ENCOUNTER — Encounter: Payer: Self-pay | Admitting: Family Medicine

## 2019-08-04 ENCOUNTER — Ambulatory Visit: Payer: Federal, State, Local not specified - PPO | Attending: Internal Medicine

## 2019-08-04 DIAGNOSIS — Z20822 Contact with and (suspected) exposure to covid-19: Secondary | ICD-10-CM

## 2019-08-04 DIAGNOSIS — Z20828 Contact with and (suspected) exposure to other viral communicable diseases: Secondary | ICD-10-CM | POA: Diagnosis not present

## 2019-08-06 LAB — NOVEL CORONAVIRUS, NAA: SARS-CoV-2, NAA: NOT DETECTED

## 2019-08-31 ENCOUNTER — Emergency Department
Admission: EM | Admit: 2019-08-31 | Discharge: 2019-08-31 | Disposition: A | Discharge status: Home / Self Care | Payer: Federal, State, Local not specified - PPO | Source: Home / Self Care

## 2019-08-31 ENCOUNTER — Other Ambulatory Visit: Payer: Self-pay

## 2019-08-31 DIAGNOSIS — H66002 Acute suppurative otitis media without spontaneous rupture of ear drum, left ear: Secondary | ICD-10-CM

## 2019-08-31 DIAGNOSIS — J029 Acute pharyngitis, unspecified: Secondary | ICD-10-CM | POA: Diagnosis not present

## 2019-08-31 DIAGNOSIS — R519 Headache, unspecified: Secondary | ICD-10-CM | POA: Diagnosis not present

## 2019-08-31 LAB — POCT RAPID STREP A (OFFICE): Rapid Strep A Screen: NEGATIVE

## 2019-08-31 MED ORDER — BUTALBITAL-APAP-CAFFEINE 50-325-40 MG PO TABS: 1.0000 | ORAL_TABLET | Freq: Four times a day (QID) | ORAL | 0 refills | Status: AC | PRN

## 2019-08-31 MED ORDER — AMOXICILLIN 875 MG PO TABS: 875.0000 mg | ORAL_TABLET | Freq: Two times a day (BID) | ORAL | 0 refills | Status: DC

## 2019-08-31 NOTE — ED Triage Notes (Signed)
Left ear ache started Monday.  Headache started Tuesday as well as sore throat. Had Covid shot 1st- 10 days ago.

## 2019-08-31 NOTE — Discharge Instructions (Addendum)
  Your COVID 19 results will be available in 48-72 hours. Negative results are immediately resulted to Mychart. All positive results are communicated with a phone call from our office.

## 2019-08-31 NOTE — ED Provider Notes (Signed)
Kimberly Richards CARE    CSN: [id number] Arrival date & time: 08/31/19  1255      History   Chief Complaint Chief Complaint  Patient presents with  . Otalgia  . Headache  . Sore Throat    HPI Kimberly Richards is a 44 y.o. female.   HPI  Patient presents today with left-sided headache, soreness of throat, and left ear pain.  Symptoms have been present now for 5 days.  Patient has received 1 of 2 COVID-19 vaccines. She received her first dose around 08/22/2019. She has not experienced fever. Denies recurrent sinus infections or chronic headaches.  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 Past Medical History:  Diagnosis Date  . Abnormal Pap smear of cervix   . Anemia   . Anxiety with depression 12/18/2017  . H/O ovarian cancer   . HPV (human papilloma virus) anogenital infection   . Ovarian cyst   . Primary hyperparathyroidism [facility])     Patient Active Problem List   Diagnosis Date Noted  . Primary hyperparathyroidism ([place])   . Anxiety with depression 12/18/2017  . Acne 12/18/2017  . Poor venous access 03/21/2016    Past Surgical History:  Procedure Laterality Date  . ABDOMINAL HYSTERECTOMY    . BREAST BIOPSY Right   . CESAREAN SECTION     x3  . LAPAROSCOPIC HYSTERECTOMY     total  . LAPAROTOMY    . Ovarian mass removed  12/06/2011   Borderline Ovarian Cancer    OB History    Gravida  3   Para  3   Term  3   Preterm      AB      Living  3     SAB      TAB      Ectopic      Multiple      Live Births               Home Medications    Prior to Admission medications   Medication Sig Start Date End Date Taking? Authorizing Provider  acetaminophen-codeine (TYLENOL #3) 300-30 MG tablet Take 1 tablet by mouth every 4 (four) hours as needed for moderate pain. 06/10/19   Shelda Pal, DO  Azelaic Acid 15 % cream Apply topically 2 (two) times daily. After skin  is thoroughly washed and patted dry, gently but thoroughly massage a thin film of azelaic acid cream into the affected area twice daily, in the morning and evening.    [provider]  B Complex-C-Folic Acid (HM VITAMIN B COMPLEX/VITAMIN C PO) Take 1 tablet by mouth 2 (two) times daily.     [provider]  clonazePAM (KLONOPIN) 0.5 MG tablet Take 0.5-1 tablets (0.25-0.5 mg total) by mouth 2 (two) times daily as needed for anxiety. 07/21/19   Shelda Pal, DO  escitalopram (LEXAPRO) 10 MG tablet Take 1 tablet (10 mg total) by mouth daily. 07/21/19   Shelda Pal, DO  Melatonin 5 MG TABS Take 1 tablet by mouth at bedtime.    [provider]  Multiple Vitamin (MULTIVITAMIN) tablet Take 1 tablet by mouth 2 (two) times daily.     [provider]  spironolactone (ALDACTONE) 50 MG tablet TAKE 1 TABLET(50 MG) BY MOUTH DAILY 05/07/19   Wendling, Crosby Oyster, DO  tretinoin (RETIN-A) 0.025 % cream Apply topically at bedtime.    [provider]  Family History Family History  Problem Relation Age of Onset  . Hypertension Mother   . Diabetes Mother   . Hypertension Father   . Diabetes Father   . Cancer Paternal Grandmother        breast  . Hypertension Paternal Grandmother   . Diabetes Paternal Grandmother   . Hypertension Maternal Grandmother   . Diabetes Maternal Grandmother   . Hypertension Maternal Grandfather   . Diabetes Maternal Grandfather   . Hypertension Paternal Grandfather   . Diabetes Paternal Grandfather     Social History Social History   Tobacco Use  . Smoking status: Never Smoker  . Smokeless tobacco: Never Used  Substance Use Topics  . Alcohol use: Yes    Alcohol/week: 0.0 standard drinks    Comment: cocktails 2 x week  . Drug use: No     Allergies   Stadol [butorphanol]   Review of Systems Review of Systems Pertinent negatives listed in HPI Physical Exam Triage Vital Signs ED Triage Vitals   Enc Vitals Group     BP 08/31/19 1344 (!) 141/87     Pulse Rate 08/31/19 1344 85     Resp 08/31/19 1344 20     Temp 08/31/19 1344 97.9 F (36.6 C)     Temp src --      SpO2 08/31/19 1344 99 %     Weight 08/31/19 1345 179 lb (81.2 kg)     Height 08/31/19 1345 5\' 3"  (1.6 m)     Head Circumference --      Peak Flow --      Pain Score 08/31/19 1344 8     Pain Loc --      Pain Edu? --      Excl. in [place]? --    No data found.  Updated Vital Signs BP (!) 141/87 (BP Location: Right Arm)   Pulse 85   Temp 97.9 F (36.6 C)   Resp 20   Ht 5\' 3"  (1.6 m)   Wt 179 lb (81.2 kg)   SpO2 99%   BMI 31.71 kg/m   Visual Acuity Right Eye Distance:   Left Eye Distance:   Bilateral Distance:    Right Eye Near:   Left Eye Near:    Bilateral Near:     Physical Exam Constitutional:      Appearance: She is ill-appearing.  HENT:     Head: Normocephalic.     Left Ear: Swelling and tenderness present. A middle ear effusion is present. Tympanic membrane is erythematous. Tympanic membrane is not perforated.     Mouth/Throat:     Pharynx: Posterior oropharyngeal erythema present.  Eyes:     Extraocular Movements: Extraocular movements intact.     Pupils: Pupils are equal, round, and reactive to light.  Cardiovascular:     Rate and Rhythm: Normal rate and regular rhythm.  Pulmonary:     Effort: Pulmonary effort is normal.     Breath sounds: Normal breath sounds.  Musculoskeletal:        General: Normal range of motion.     Cervical back: Normal range of motion.  Neurological:     Mental Status: She is alert and oriented to person, place, and time.     GCS: GCS eye subscore is 4. GCS verbal subscore is 5. GCS motor subscore is 6.     Cranial Nerves: No facial asymmetry.     Motor: No weakness.     Gait: Gait normal.  Psychiatric:  Mood and Affect: Mood normal.      UC Treatments / Results  Labs (all labs ordered are listed, but only abnormal results are displayed) Labs  Reviewed - No data to display  EKG   Radiology No results found.  Procedures Procedures (including critical care time)  Medications Ordered in UC Medications - No data to display  Initial Impression / Assessment and Plan / UC Course  I have reviewed the triage vital signs and the nursing notes.  Pertinent labs & imaging results that were available during my care of the patient were reviewed by me and considered in my medical decision making (see chart for details).   Rapid strep negative. COVID-19 test pending. Left ear acute otitis media per exam findings-treating with Augmentin BID x 10 days. Fioricet for left sided headache, likely exacerbated by current otitis media infection. Advised to return for evaluation if symptoms did not resolved or significantly improve. Final Clinical Impressions(s) / UC Diagnoses   Final diagnoses:  Sore throat  Non-recurrent acute suppurative otitis media of left ear without spontaneous rupture of tympanic membrane  Left-sided headache     Discharge Instructions      Your COVID 19 results will be available in 48-72 hours. Negative results are immediately resulted to Mychart. All positive results are communicated with a phone call from our office.     ED Prescriptions    Medication Sig Dispense Auth. Provider   amoxicillin (AMOXIL) 875 MG tablet Take 1 tablet (875 mg total) by mouth 2 (two) times daily. 20 tablet Scot Jun, FNP   butalbital-acetaminophen-caffeine (FIORICET) 50-325-40 MG tablet Take 1 tablet by mouth every 6 (six) hours as needed for headache. 20 tablet Scot Jun, FNP     PDMP not reviewed this encounter.   Scot Jun, FNP 09/02/19 1343

## 2019-09-01 LAB — NOVEL CORONAVIRUS, NAA: SARS-CoV-2, NAA: NOT DETECTED

## 2019-09-02 LAB — STREP A DNA PROBE: Group A Strep Probe: NOT DETECTED

## 2019-09-03 DIAGNOSIS — F321 Major depressive disorder, single episode, moderate: Secondary | ICD-10-CM | POA: Diagnosis not present

## 2019-09-03 DIAGNOSIS — F411 Generalized anxiety disorder: Secondary | ICD-10-CM | POA: Diagnosis not present

## 2019-09-09 ENCOUNTER — Other Ambulatory Visit: Payer: Self-pay

## 2019-09-10 ENCOUNTER — Encounter: Payer: Self-pay | Admitting: Family Medicine

## 2019-09-10 ENCOUNTER — Ambulatory Visit: Payer: Federal, State, Local not specified - PPO | Admitting: Family Medicine

## 2019-09-10 VITALS — BP 112/78 | HR 84 | Temp 95.7°F | Ht 63.0 in | Wt 181.0 lb

## 2019-09-10 DIAGNOSIS — F418 Other specified anxiety disorders: Secondary | ICD-10-CM

## 2019-09-10 DIAGNOSIS — H6982 Other specified disorders of Eustachian tube, left ear: Secondary | ICD-10-CM

## 2019-09-10 MED ORDER — FLUCONAZOLE 150 MG PO TABS: ORAL_TABLET | ORAL | 0 refills | Status: DC

## 2019-09-10 MED ORDER — FLUTICASONE PROPIONATE 50 MCG/ACT NA SUSP: 2.0000 | Freq: Every day | NASAL | 6 refills | Status: DC

## 2019-09-10 MED ORDER — PREDNISONE 20 MG PO TABS: 40.0000 mg | ORAL_TABLET | Freq: Every day | ORAL | 0 refills | Status: AC

## 2019-09-10 NOTE — Progress Notes (Signed)
Chief Complaint  Patient presents with  . Follow-up    medication    Subjective Kimberly Richards presents for f/u anxiety/depression.  She is currently being treated with Lexapro 10 mg/d.  Feels good on it, having some sexual AE's.  No thoughts of harming self or others. No self-medication with alcohol, prescription drugs or illicit drugs. Pt is following with a counselor/psychologist. She is getting a genetic test to determine which antidepressant/anxiety medication would be best for her.  L ear fullness since being tx'd for AOM and strep throat w amox. Might be getting yeast infxn from abx. No drainage from ear, some sinus pain on L side also. Sudafed helps temporarily.  No history of allergies.  She does not take any allergy medication, nor does she take any nasal steroids.  ROS Psych: No homicidal or suicidal thoughts HEENT: As noted in HPI  Past Medical History:  Diagnosis Date  . Abnormal Pap smear of cervix   . Anemia   . Anxiety with depression 12/18/2017  . H/O ovarian cancer   . HPV (human papilloma virus) anogenital infection   . Ovarian cyst   . Primary hyperparathyroidism ([facility])    Exam BP 112/78 (BP Location: Left Arm, Patient Position: Sitting, Cuff Size: Normal)   Pulse 84   Temp (!) 95.7 F (35.4 C) (Temporal)   Ht 5\' 3"  (1.6 m)   Wt 181 lb (82.1 kg)   SpO2 96%   BMI 32.06 kg/m  General:  well developed, well nourished, in no apparent distress HEENT: Ears neg b/l, nares patent, R turb is pale and edematous, L turb neg, no d/c Lungs:  clear to auscultation, breath sounds equal bilaterally, no respiratory distress Cardio:  regular rate and rhythm without murmurs, heart sounds without clicks or rubs Psych: well oriented with normal range of affect and age-appropriate judgement/insight, alert and oriented x4.  Assessment and Plan  Anxiety with depression  ETD (Eustachian tube dysfunction), left - Plan: predniSONE (DELTASONE) 20 MG tablet, fluticasone  (FLONASE) 50 MCG/ACT nasal spray  Cont SSRI. Will change once she gets her results.  Pred burst, start INCS. If no improvement, will refer to ENT.  F/u in in 6 mo for CPE, will likely be able to see her yrly after this. The patient voiced understanding and agreement to the plan.  [place], DO 09/10/19 4:42 PM

## 2019-09-10 NOTE — Patient Instructions (Signed)
Send me a message in 3-4 weeks if ear still bothersome.  Let us know if you need anything.

## 2019-10-10 DIAGNOSIS — F324 Major depressive disorder, single episode, in partial remission: Secondary | ICD-10-CM | POA: Diagnosis not present

## 2019-10-10 DIAGNOSIS — F410 Panic disorder [episodic paroxysmal anxiety] without agoraphobia: Secondary | ICD-10-CM | POA: Diagnosis not present

## 2019-10-10 DIAGNOSIS — F411 Generalized anxiety disorder: Secondary | ICD-10-CM | POA: Diagnosis not present

## 2019-10-13 ENCOUNTER — Encounter: Payer: Self-pay | Admitting: Family Medicine

## 2019-10-21 DIAGNOSIS — F99 Mental disorder, not otherwise specified: Secondary | ICD-10-CM | POA: Diagnosis not present

## 2019-10-22 DIAGNOSIS — F99 Mental disorder, not otherwise specified: Secondary | ICD-10-CM | POA: Diagnosis not present

## 2019-11-12 DIAGNOSIS — F410 Panic disorder [episodic paroxysmal anxiety] without agoraphobia: Secondary | ICD-10-CM | POA: Diagnosis not present

## 2019-11-12 DIAGNOSIS — F324 Major depressive disorder, single episode, in partial remission: Secondary | ICD-10-CM | POA: Diagnosis not present

## 2019-11-12 DIAGNOSIS — F411 Generalized anxiety disorder: Secondary | ICD-10-CM | POA: Diagnosis not present

## 2019-11-25 DIAGNOSIS — F99 Mental disorder, not otherwise specified: Secondary | ICD-10-CM | POA: Diagnosis not present

## 2019-12-09 DIAGNOSIS — F99 Mental disorder, not otherwise specified: Secondary | ICD-10-CM | POA: Diagnosis not present

## 2019-12-10 DIAGNOSIS — F410 Panic disorder [episodic paroxysmal anxiety] without agoraphobia: Secondary | ICD-10-CM | POA: Diagnosis not present

## 2019-12-10 DIAGNOSIS — F411 Generalized anxiety disorder: Secondary | ICD-10-CM | POA: Diagnosis not present

## 2019-12-10 DIAGNOSIS — F324 Major depressive disorder, single episode, in partial remission: Secondary | ICD-10-CM | POA: Diagnosis not present

## 2019-12-23 DIAGNOSIS — F99 Mental disorder, not otherwise specified: Secondary | ICD-10-CM | POA: Diagnosis not present

## 2020-01-06 DIAGNOSIS — F332 Major depressive disorder, recurrent severe without psychotic features: Secondary | ICD-10-CM | POA: Diagnosis not present

## 2020-01-20 ENCOUNTER — Other Ambulatory Visit (HOSPITAL_BASED_OUTPATIENT_CLINIC_OR_DEPARTMENT_OTHER): Payer: Self-pay | Admitting: Family Medicine

## 2020-01-20 ENCOUNTER — Other Ambulatory Visit (HOSPITAL_BASED_OUTPATIENT_CLINIC_OR_DEPARTMENT_OTHER): Payer: Self-pay | Admitting: Obstetrics & Gynecology

## 2020-01-20 DIAGNOSIS — Z1231 Encounter for screening mammogram for malignant neoplasm of breast: Secondary | ICD-10-CM

## 2020-02-03 DIAGNOSIS — F332 Major depressive disorder, recurrent severe without psychotic features: Secondary | ICD-10-CM | POA: Diagnosis not present

## 2020-02-17 DIAGNOSIS — F332 Major depressive disorder, recurrent severe without psychotic features: Secondary | ICD-10-CM | POA: Diagnosis not present

## 2020-03-03 ENCOUNTER — Other Ambulatory Visit: Payer: Self-pay

## 2020-03-03 ENCOUNTER — Ambulatory Visit (HOSPITAL_BASED_OUTPATIENT_CLINIC_OR_DEPARTMENT_OTHER)
Admission: RE | Admit: 2020-03-03 | Discharge: 2020-03-03 | Disposition: A | Discharge status: Home / Self Care | Payer: Federal, State, Local not specified - PPO | Source: Ambulatory Visit | Attending: Obstetrics & Gynecology | Admitting: Obstetrics & Gynecology

## 2020-03-03 DIAGNOSIS — Z1231 Encounter for screening mammogram for malignant neoplasm of breast: Secondary | ICD-10-CM | POA: Diagnosis not present

## 2020-03-14 ENCOUNTER — Other Ambulatory Visit: Payer: Self-pay | Admitting: Family Medicine

## 2020-03-17 ENCOUNTER — Encounter: Payer: Self-pay | Admitting: Family Medicine

## 2020-03-17 ENCOUNTER — Other Ambulatory Visit: Payer: Self-pay

## 2020-03-17 ENCOUNTER — Ambulatory Visit (INDEPENDENT_AMBULATORY_CARE_PROVIDER_SITE_OTHER): Payer: Federal, State, Local not specified - PPO | Admitting: Family Medicine

## 2020-03-17 VITALS — BP 108/78 | HR 106 | Temp 97.6°F | Ht 64.0 in | Wt 187.5 lb

## 2020-03-17 DIAGNOSIS — Z1159 Encounter for screening for other viral diseases: Secondary | ICD-10-CM | POA: Diagnosis not present

## 2020-03-17 DIAGNOSIS — Z0000 Encounter for general adult medical examination without abnormal findings: Secondary | ICD-10-CM | POA: Diagnosis not present

## 2020-03-17 LAB — COMPREHENSIVE METABOLIC PANEL
ALT: 25 U/L (ref 0–35)
AST: 27 U/L (ref 0–37)
Albumin: 4.3 g/dL (ref 3.5–5.2)
Alkaline Phosphatase: 72 U/L (ref 39–117)
BUN: 13 mg/dL (ref 6–23)
CO2: 23 mEq/L (ref 19–32)
Calcium: 11 mg/dL — ABNORMAL HIGH (ref 8.4–10.5)
Chloride: 103 mEq/L (ref 96–112)
Creatinine, Ser: 0.84 mg/dL (ref 0.40–1.20)
GFR: 88.47 mL/min (ref >60.00)
Glucose, Bld: 95 mg/dL (ref 70–99)
Potassium: 4.4 mEq/L (ref 3.5–5.1)
Sodium: 138 mEq/L (ref 135–145)
Total Bilirubin: 0.6 mg/dL (ref 0.2–1.2)
Total Protein: 8 g/dL (ref 6.0–8.3)

## 2020-03-17 LAB — LIPID PANEL
Cholesterol: 234 mg/dL — ABNORMAL HIGH (ref 0–200)
HDL: 79.2 mg/dL (ref >39.00)
LDL Cholesterol: 134 mg/dL — ABNORMAL HIGH (ref 0–99)
NonHDL: 154.88
Total CHOL/HDL Ratio: 3
Triglycerides: 103 mg/dL (ref 0.0–149.0)
VLDL: 20.6 mg/dL (ref 0.0–40.0)

## 2020-03-17 LAB — CBC
HCT: 40.8 % (ref 36.0–46.0)
Hemoglobin: 13.6 g/dL (ref 12.0–15.0)
MCHC: 33.3 g/dL (ref 30.0–36.0)
MCV: 87.9 fl (ref 78.0–100.0)
Platelets: 260 10*3/uL (ref 150.0–400.0)
RBC: 4.64 Mil/uL (ref 3.87–5.11)
RDW: 13.4 % (ref 11.5–15.5)
WBC: 5.4 10*3/uL (ref 4.0–10.5)

## 2020-03-17 MED ORDER — VORTIOXETINE HBR 10 MG PO TABS: 10.0000 mg | ORAL_TABLET | Freq: Every day | ORAL | Status: DC

## 2020-03-17 MED ORDER — SPIRONOLACTONE 50 MG PO TABS: 50.0000 mg | ORAL_TABLET | Freq: Every day | ORAL | 2 refills | Status: DC

## 2020-03-17 NOTE — Progress Notes (Signed)
Chief Complaint  Patient presents with  . Annual Exam     Well Woman Kimberly Richards is here for a complete physical.   Her last physical was >1 year ago.  Current diet: in general, a "healthy" diet. Current exercise: yoga, elliptical . Weight is increased a bit and she denies fatigue out of ordinary. Seatbelt? Yes  Health Maintenance Pap/HPV- Yes Mammogram- Yes Tetanus- Yes Hep C screening- No HIV screening- Yes  Past Medical History:  Diagnosis Date  . Abnormal Pap smear of cervix   . Acne    On Aldactone  . Anemia   . Anxiety with depression 12/18/2017  . H/O ovarian cancer   . HPV (human papilloma virus) anogenital infection   . Ovarian cyst   . Primary hyperparathyroidism [facility])      Past Surgical History:  Procedure Laterality Date  . ABDOMINAL HYSTERECTOMY    . BREAST BIOPSY Right   . CESAREAN SECTION     x3  . LAPAROSCOPIC HYSTERECTOMY     total  . LAPAROTOMY    . Ovarian mass removed  12/06/2011   Borderline Ovarian Cancer    Medications  Current Outpatient Medications on File Prior to Visit  Medication Sig Dispense Refill  . Azelaic Acid 15 % cream Apply topically 2 (two) times daily. After skin is thoroughly washed and patted dry, gently but thoroughly massage a thin film of azelaic acid cream into the affected area twice daily, in the morning and evening.    . B Complex-C-Folic Acid (HM VITAMIN B COMPLEX/VITAMIN C PO) Take 1 tablet by mouth 2 (two) times daily.     . butalbital-acetaminophen-caffeine (FIORICET) 50-325-40 MG tablet Take 1 tablet by mouth every 6 (six) hours as needed for headache. 20 tablet 0  . fluconazole (DIFLUCAN) 150 MG tablet Take 1 tab, repeat in 48 hours if no improvement. 2 tablet 0  . fluticasone (FLONASE) 50 MCG/ACT nasal spray Place 2 sprays into both nostrils daily. 16 g 6  . Melatonin 5 MG TABS Take 1 tablet by mouth at bedtime.    . Multiple Vitamin (MULTIVITAMIN) tablet Take 1 tablet by mouth 2 (two) times daily.      Marland Kitchen spironolactone (ALDACTONE) 50 MG tablet TAKE 1 TABLET(50 MG) BY MOUTH DAILY 30 tablet 6  . tretinoin (RETIN-A) 0.025 % cream Apply topically at bedtime.    Marland Kitchen escitalopram (LEXAPRO) 10 MG tablet Take 1 tablet (10 mg total) by mouth daily. 30 tablet 2    Allergies Allergies  Allergen Reactions  . Stadol [Butorphanol]     Review of Systems: Constitutional:  no unexpected weight changes Eye:  no recent significant change in vision Ear/Nose/Mouth/Throat:  Ears:  no recent change in hearing Nose/Mouth/Throat:  no complaints of nasal congestion, no sore throat Cardiovascular: no chest pain Respiratory:  no shortness of breath Gastrointestinal:  no abdominal pain, no change in bowel habits GU:  Female: negative for dysuria or pelvic pain; +freq Musculoskeletal/Extremities: +L hip pain; otherwise no pain of the joints Integumentary (Skin/Breast):  no abnormal skin lesions reported Neurologic:  no headaches Endocrine:  denies fatigue Hematologic/Lymphatic:  No areas of easy bleeding  Exam BP 108/78 (BP Location: Left Arm, Patient Position: Sitting, Cuff Size: Normal)   Pulse (!) 106   Temp 97.6 F (36.4 C) (Oral)   Ht 5\' 4"  (1.626 m)   Wt 187 lb 8 oz (85 kg)   SpO2 98%   BMI 32.18 kg/m  General:  well developed, well nourished, in no  apparent distress Skin:  no significant moles, warts, or growths Head:  no masses, lesions, or tenderness Eyes:  pupils equal and round, sclera anicteric without injection Ears:  canals without lesions, TMs shiny without retraction, no obvious effusion, no erythema Nose:  nares patent, septum midline, mucosa normal, and no drainage or sinus tenderness Throat/Pharynx:  lips and gingiva without lesion; tongue and uvula midline; non-inflamed pharynx; no exudates or postnasal drainage Neck: neck supple without adenopathy, thyromegaly, or masses Lungs:  clear to auscultation, breath sounds equal bilaterally, no respiratory distress Cardio:  regular rate  and rhythm, no LE edema Abdomen:  abdomen soft, nontender; bowel sounds normal; no masses or organomegaly Genital: Defer to GYN Musculoskeletal: +TTP over distal insertion of glutes on L; symmetrical muscle groups noted without atrophy or deformity Extremities:  no clubbing, cyanosis, or edema, no deformities, no skin discoloration Neuro:  gait normal; deep tendon reflexes normal and symmetric Psych: well oriented with normal range of affect and appropriate judgment/insight  Assessment and Plan  Well adult exam - Plan: CBC, Comprehensive metabolic panel, Lipid panel  Encounter for hepatitis C screening test for low risk patient - Plan: Hepatitis C antibody   Well 44 y.o. female. Counseled on diet and exercise. Other orders as above. Stretches/exercises for glutes. If no improvement in next 1 mo, will refer to PT. For her acne she is taking Aldactone. Discussed stopping which she does not want to do. Urination could be OAB vs med AE. She does not wish to start a medication at this time but will let us know if things change.  Follow up in 6 mo or prn. The patient voiced understanding and agreement to the plan.  [place], DO 03/17/20 8:15 AM

## 2020-03-17 NOTE — Patient Instructions (Addendum)
Give [place] 2-3 business days to get the results of your labs back.   Keep the diet clean and stay active.   Let me know if you want to try a medicine to help with an overactive bladder.   Heat (pad or rice pillow in microwave) over affected area, 10-15 minutes twice daily.   Ice/cold pack over area for 10-15 min twice daily.  Let us know if you need anything.  Gluteus Medius Syndrome Rehab It is normal to feel mild stretching, pulling, tightness, or discomfort as you do these exercises, but you should stop right away if you feel sudden pain or your pain gets worse.   Stretching and range of motion exercise This exercise warms up your muscles and joints and improves the movement and flexibility of your hip and pelvis. This exercise also helps to relieve pain and stiffness. Exercise A: Lunge (hip flexor stretch)     1. Kneel on the floor on your left / right knee. Bend your other knee so it is directly over your ankle. 2. Keep good posture with your head over your shoulders. Tuck your tailbone underneath you. This will prevent your back from arching too much. 3. You should feel a gentle stretch in the front of your thigh or hip. If you do not feel a stretch, slowly lunge forward with your chest up. 4. Hold this position for 30 seconds. 5. Slowly return to the starting position. Repeat 2 times. Complete this exercise 3 times per week. Strengthening exercises These exercises build strength and endurance in your hip and pelvis. Endurance is the ability to use your muscles for a long time, even after they get tired. Exercise B: Bridge (hip extensors)    1. Lie on your back on a firm surface with your knees bent and your feet flat on the floor. 2. Tighten your buttocks muscles and lift your bottom off the floor until the trunk of your body is level with your thighs. ? You should feel the muscles working in your buttocks and the back of your thighs. If this exercise is too easy, cross your  arms over your chest or lift one leg while your bottom is up off the floor. ? Do not arch your back. 3. Hold this position for 3 seconds. 4. Slowly lower your hips to the starting position. 5. Let your muscles relax completely between repetitions. Repeat 2 times. Complete this exercise 3 times per week. Exercise C: Straight leg raises (hip abductors)    1. Lie on your side with your left / right leg in the top position. Lie so your head, shoulder, knee, and hip line up. Bend your bottom knee to help you balance. 2. Lift your top leg up 4-6 inches (10-15 cm), keeping your toes pointed straight ahead. 3. Hold this position for 2 seconds. 4. Slowly lower your leg to the starting position and let your muscles relax completely. Repeat for a total of 10 repetitions. Repeat 2 times. Complete this exercise 3 times per week. Exercise D: Hip abductors and external rotators, quadruped 1. Get on your hands and knees on a firm, lightly padded surface. Your hands should be directly below your shoulders, and your knees should be directly below your hips. 2. Lift your left / right knee out to the side. Keep your knee bent. Do not twist your body. 3. Hold this position for 3 seconds. 4. Slowly lower your leg. Repeat for a total of 10 repetitions.  Repeat 2 times. Complete this  exercise 3 times per week. Exercise E: Single leg stand 1. Stand near a counter or door frame to hold onto as needed. It is helpful to look in a mirror for this exercise so you can watch your hip. 2. Squeeze your left / right buttock muscles then lift up your other foot. Do not let your left / righthip push out to the side. 3. Hold this position for 3 seconds. Repeat for a total of 10 repetitions. Repeat 2 times. Complete this exercise 3 times per week. Make sure you discuss any questions you have with your health care provider. Document Released: 07/24/2005 Document Revised: 03/30/2016 Document Reviewed: 07/06/2015 Elsevier  Interactive Patient Education  [employer].

## 2020-03-18 LAB — HEPATITIS C ANTIBODY
Hepatitis C Ab: NONREACTIVE
SIGNAL TO CUT-OFF: 0.01 (ref <1.00)

## 2020-03-30 DIAGNOSIS — F332 Major depressive disorder, recurrent severe without psychotic features: Secondary | ICD-10-CM | POA: Diagnosis not present

## 2020-04-13 DIAGNOSIS — F332 Major depressive disorder, recurrent severe without psychotic features: Secondary | ICD-10-CM | POA: Diagnosis not present

## 2020-04-23 ENCOUNTER — Ambulatory Visit (INDEPENDENT_AMBULATORY_CARE_PROVIDER_SITE_OTHER): Payer: Federal, State, Local not specified - PPO | Admitting: Obstetrics & Gynecology

## 2020-04-23 ENCOUNTER — Other Ambulatory Visit: Payer: Self-pay

## 2020-04-23 ENCOUNTER — Encounter: Payer: Self-pay | Admitting: Obstetrics & Gynecology

## 2020-04-23 VITALS — BP 123/75 | Ht 64.0 in | Wt 190.0 lb

## 2020-04-23 DIAGNOSIS — Z01419 Encounter for gynecological examination (general) (routine) without abnormal findings: Secondary | ICD-10-CM | POA: Diagnosis not present

## 2020-04-23 DIAGNOSIS — Z8543 Personal history of malignant neoplasm of ovary: Secondary | ICD-10-CM

## 2020-04-23 NOTE — Progress Notes (Signed)
Subjective:     Kimberly Richards is a 44 y.o. female here for a routine exam.  Current complaints: Pt has no problems or concerns. She has been doing yoga for exercise. She denies abd fullness or pelvic discomfort. Denies urinary sx.        Gynecologic History No LMP recorded. Patient has had a hysterectomy. Contraception: status post hysterectomy Last Pap: 2016. Results were: normal Last mammogram: 03/03/2020. Results were: normal  Obstetric History OB History  Gravida Para Term Preterm AB Living  3 3 3     3   SAB TAB Ectopic Multiple Live Births               # Outcome Date GA Lbr Len/2nd Weight Sex Delivery Anes PTL Lv  3 Term      CS-Unspec     2 Term      CS-Unspec     1 Term      CS-Unspec       The following portions of the patient's history were reviewed and updated as appropriate: allergies, current medications, past family history, past medical history, past social history, past surgical history and problem list.  Review of Systems Pertinent items are noted in HPI.    Objective:  BP 123/75    Ht 5\' 4"  (1.626 m)    Wt 190 lb (86.2 kg)    BMI 32.61 kg/m  General Appearance:    Alert, cooperative, no distress, appears stated age  Head:    Normocephalic, without obvious abnormality, atraumatic  Eyes:    conjunctiva/corneas clear, EOM's intact, both eyes  Ears:    Normal external ear canals, both ears  Nose:   Nares normal, septum midline, mucosa normal, no drainage    or sinus tenderness  Throat:   Lips, mucosa, and tongue normal; teeth and gums normal  Neck:   Supple, symmetrical, trachea midline, no adenopathy;    thyroid:  no enlargement/tenderness/nodules  Back:     Symmetric, no curvature, ROM normal, no CVA tenderness  Lungs:     respirations unlabored  Chest Wall:    No tenderness or deformity   Heart:    Regular rate and rhythm  Breast Exam:    No tenderness, masses, or nipple abnormality  Abdomen:     Soft, non-tender, bowel sounds active all four  quadrants,    no masses, no organomegaly  Genitalia:    Normal female without lesion, discharge or tenderness   No adnexal or pelvic masses noted  Extremities:   Extremities normal, atraumatic, no cyanosis or edema  Pulses:   2+ and symmetric all extremities  Skin:   Skin color, texture, turgor normal, no rashes or lesions    Assessment:    Healthy female exam.   S/p hysterectomy with BSO for ovarian cancer. Her CA125 has been stable.    Plan:   Labs: CA125 today F/u in 1 year or sooner prn   Kimberly Richards, M.D., Cherlynn June

## 2020-04-23 NOTE — Progress Notes (Signed)
Patient presents for annual exam.  Up to date on mammogram 03/03/20.

## 2020-04-24 LAB — CA 125: Cancer Antigen (CA) 125: 8.4 U/mL (ref 0.0–38.1)

## 2020-04-27 DIAGNOSIS — F332 Major depressive disorder, recurrent severe without psychotic features: Secondary | ICD-10-CM | POA: Diagnosis not present

## 2020-05-04 ENCOUNTER — Encounter: Payer: Self-pay | Admitting: Obstetrics & Gynecology

## 2020-05-11 DIAGNOSIS — F332 Major depressive disorder, recurrent severe without psychotic features: Secondary | ICD-10-CM | POA: Diagnosis not present

## 2020-06-22 DIAGNOSIS — F332 Major depressive disorder, recurrent severe without psychotic features: Secondary | ICD-10-CM | POA: Diagnosis not present

## 2020-07-06 DIAGNOSIS — F332 Major depressive disorder, recurrent severe without psychotic features: Secondary | ICD-10-CM | POA: Diagnosis not present

## 2020-08-11 DIAGNOSIS — Z20828 Contact with and (suspected) exposure to other viral communicable diseases: Secondary | ICD-10-CM | POA: Diagnosis not present

## 2020-09-01 DIAGNOSIS — F332 Major depressive disorder, recurrent severe without psychotic features: Secondary | ICD-10-CM | POA: Diagnosis not present

## 2020-09-17 ENCOUNTER — Ambulatory Visit: Payer: Federal, State, Local not specified - PPO | Admitting: Family Medicine

## 2020-09-17 ENCOUNTER — Other Ambulatory Visit: Payer: Self-pay

## 2020-09-17 ENCOUNTER — Encounter: Payer: Self-pay | Admitting: Family Medicine

## 2020-09-17 VITALS — BP 108/72 | HR 96 | Temp 98.7°F | Ht 64.0 in | Wt 193.4 lb

## 2020-09-17 DIAGNOSIS — F418 Other specified anxiety disorders: Secondary | ICD-10-CM

## 2020-09-17 DIAGNOSIS — Z1211 Encounter for screening for malignant neoplasm of colon: Secondary | ICD-10-CM

## 2020-09-17 DIAGNOSIS — E210 Primary hyperparathyroidism: Secondary | ICD-10-CM

## 2020-09-17 DIAGNOSIS — L709 Acne, unspecified: Secondary | ICD-10-CM | POA: Diagnosis not present

## 2020-09-17 LAB — COMPREHENSIVE METABOLIC PANEL
ALT: 24 U/L (ref 0–35)
AST: 23 U/L (ref 0–37)
Albumin: 4.5 g/dL (ref 3.5–5.2)
Alkaline Phosphatase: 88 U/L (ref 39–117)
BUN: 17 mg/dL (ref 6–23)
CO2: 28 mEq/L (ref 19–32)
Calcium: 11.5 mg/dL — ABNORMAL HIGH (ref 8.4–10.5)
Chloride: 101 mEq/L (ref 96–112)
Creatinine, Ser: 0.92 mg/dL (ref 0.40–1.20)
GFR: 74.87 mL/min (ref >60.00)
Glucose, Bld: 89 mg/dL (ref 70–99)
Potassium: 4.5 mEq/L (ref 3.5–5.1)
Sodium: 138 mEq/L (ref 135–145)
Total Bilirubin: 0.4 mg/dL (ref 0.2–1.2)
Total Protein: 8.2 g/dL (ref 6.0–8.3)

## 2020-09-17 MED ORDER — VORTIOXETINE HBR 10 MG PO TABS: 10.0000 mg | ORAL_TABLET | Freq: Every day | ORAL | 11 refills | Status: DC

## 2020-09-17 NOTE — Patient Instructions (Addendum)
Let me know when you need refills.   Give [place] 2-3 business days to get the results of your labs back.   If you do not hear anything about your referral in the next 1-2 weeks, call our office and ask for an update.  Let us know if you need anything.

## 2020-09-17 NOTE — Progress Notes (Signed)
Chief Complaint  Patient presents with  . Follow-up    Subjective Kimberly Richards presents for f/u anxiety/depression.  Pt is currently being treated with Trintellix 10 mg/d.  Reports doing well since treatment. No thoughts of harming self or others. No self-medication with alcohol, prescription drugs or illicit drugs. Pt is following with a counselor/psychologist.  Acne Patient has acne on her face.  She is taking spironolactone 50 mg daily.  She is compliant and reports no adverse effects.  It is helping with her acne.  Dairy seems to be a flare.  No current issues.    Past Medical History:  Diagnosis Date  . Abnormal Pap smear of cervix   . Acne    On Aldactone  . Anemia   . Anxiety with depression 12/18/2017  . H/O ovarian cancer   . HPV (human papilloma virus) anogenital infection   . Ovarian cyst   . Primary hyperparathyroidism ([place])    Allergies as of 09/17/2020      Reactions   Stadol [butorphanol]       Medication List       Accurate as of September 17, 2020  8:56 AM. If you have any questions, ask your nurse or doctor.        Azelaic Acid 15 % cream Apply topically 2 (two) times daily. After skin is thoroughly washed and patted dry, gently but thoroughly massage a thin film of azelaic acid cream into the affected area twice daily, in the morning and evening.   fluticasone 50 MCG/ACT nasal spray Commonly known as: FLONASE Place 2 sprays into both nostrils daily.   HM VITAMIN B COMPLEX/VITAMIN C PO Take 1 tablet by mouth 2 (two) times daily.   melatonin 5 MG Tabs Take 1 tablet by mouth at bedtime.   multivitamin tablet Take 1 tablet by mouth 2 (two) times daily.   spironolactone 50 MG tablet Commonly known as: ALDACTONE Take 1 tablet (50 mg total) by mouth daily.   tretinoin 0.025 % cream Commonly known as: RETIN-A Apply topically at bedtime.   vortioxetine HBr 10 MG Tabs tablet Commonly known as: TRINTELLIX Take 1 tablet (10 mg total) by  mouth daily.      Exam BP 108/72 (BP Location: Left Arm, Patient Position: Sitting, Cuff Size: Normal)   Pulse 96   Temp 98.7 F (37.1 C) (Oral)   Ht 5\' 4"  (1.626 m)   Wt 193 lb 6 oz (87.7 kg)   SpO2 100%   BMI 33.19 kg/m  General:  well developed, well nourished, in no apparent distress Lungs:  No respiratory distress Psych: well oriented with normal range of affect and age-appropriate judgement/insight, alert and oriented x4.  Assessment and Plan  Anxiety with depression - Plan: vortioxetine HBr (TRINTELLIX) 10 MG TABS tablet  Acne, unspecified acne type - Plan: Comprehensive metabolic panel  Screen for colon cancer - Plan: Ambulatory referral to Gastroenterology  Primary hyperparathyroidism ([place]) - Plan: PTH, intact (no Ca)  1.  Continue Trintellix 10 mg daily.  Continue with the counseling team. 2.  Continue spironolactone 50 mg daily.  Check sodium/potassium and kidney function. 3.  She is due for colon cancer screening. 4.  Check labs. F/u in 6 months for physical. The patient voiced understanding and agreement to the plan.  Mercedes, DO 09/17/20 8:56 AM

## 2020-09-20 LAB — PARATHYROID HORMONE, INTACT (NO CA): PTH: 44 pg/mL (ref 14–64)

## 2020-09-21 ENCOUNTER — Encounter: Payer: Self-pay | Admitting: Gastroenterology

## 2020-09-28 DIAGNOSIS — F332 Major depressive disorder, recurrent severe without psychotic features: Secondary | ICD-10-CM | POA: Diagnosis not present

## 2020-10-12 DIAGNOSIS — F332 Major depressive disorder, recurrent severe without psychotic features: Secondary | ICD-10-CM | POA: Diagnosis not present

## 2020-10-14 DIAGNOSIS — F332 Major depressive disorder, recurrent severe without psychotic features: Secondary | ICD-10-CM | POA: Diagnosis not present

## 2020-10-15 DIAGNOSIS — F332 Major depressive disorder, recurrent severe without psychotic features: Secondary | ICD-10-CM | POA: Diagnosis not present

## 2020-10-19 ENCOUNTER — Encounter: Payer: Self-pay | Admitting: *Deleted

## 2020-11-18 ENCOUNTER — Encounter: Payer: Federal, State, Local not specified - PPO | Admitting: Gastroenterology

## 2020-12-07 DIAGNOSIS — F332 Major depressive disorder, recurrent severe without psychotic features: Secondary | ICD-10-CM | POA: Diagnosis not present

## 2020-12-12 IMAGING — MG DIGITAL SCREENING BILAT W/ TOMO W/ CAD
8 series · 8 of 24 positions shown · non-contrast
Comparison: Previous exam(s).

CLINICAL DATA: Screening.

EXAM:
DIGITAL SCREENING BILATERAL MAMMOGRAM WITH TOMO AND CAD

[R MLO synth-2D]
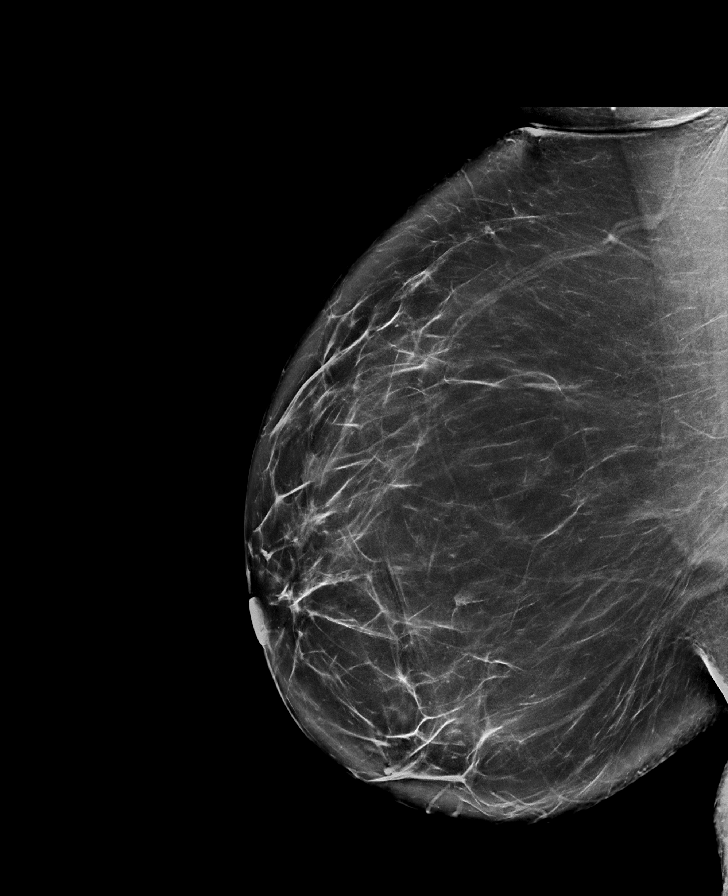

[L CC synth-2D]
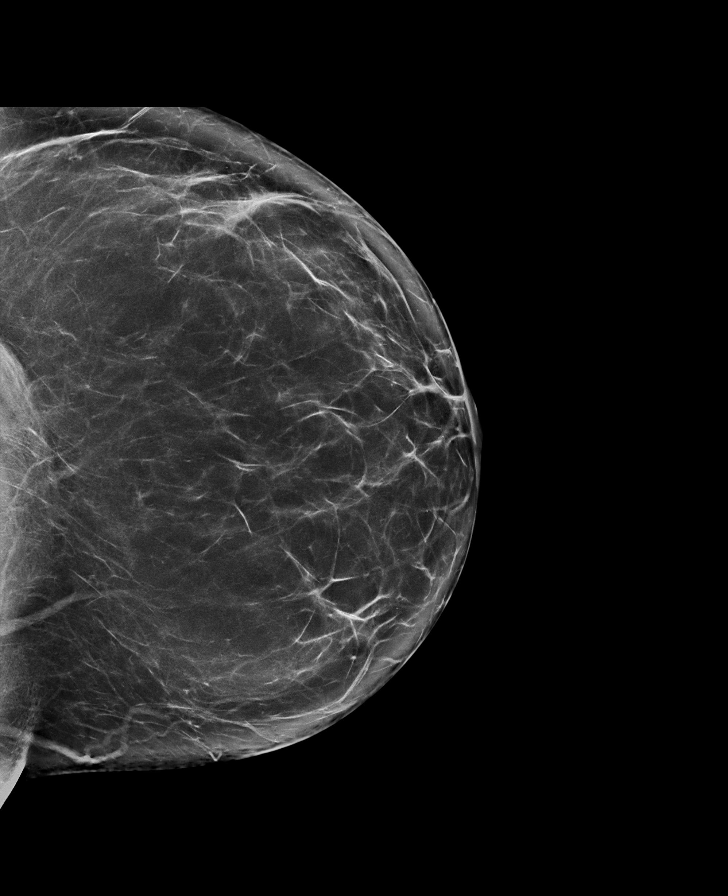

[L MLO synth-2D]
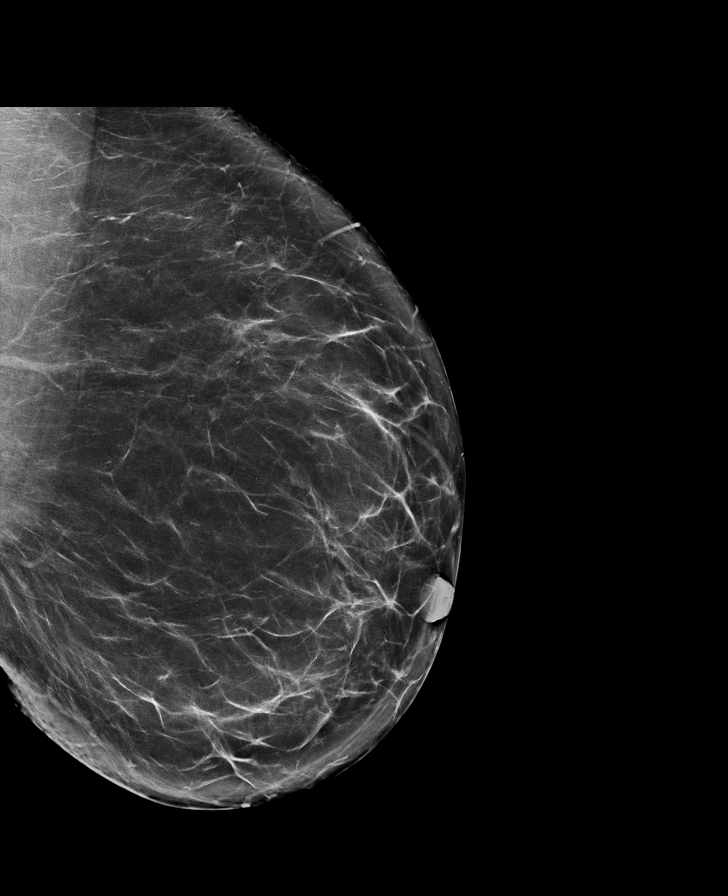

[R CC synth-2D]
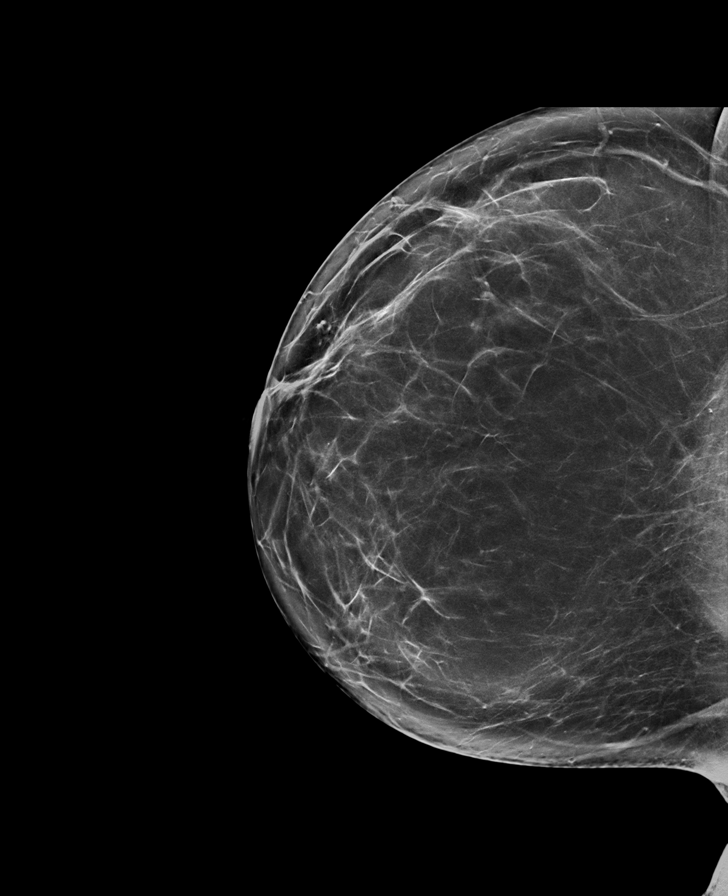

[L MLO tomo · tomo slice 43/86.0]
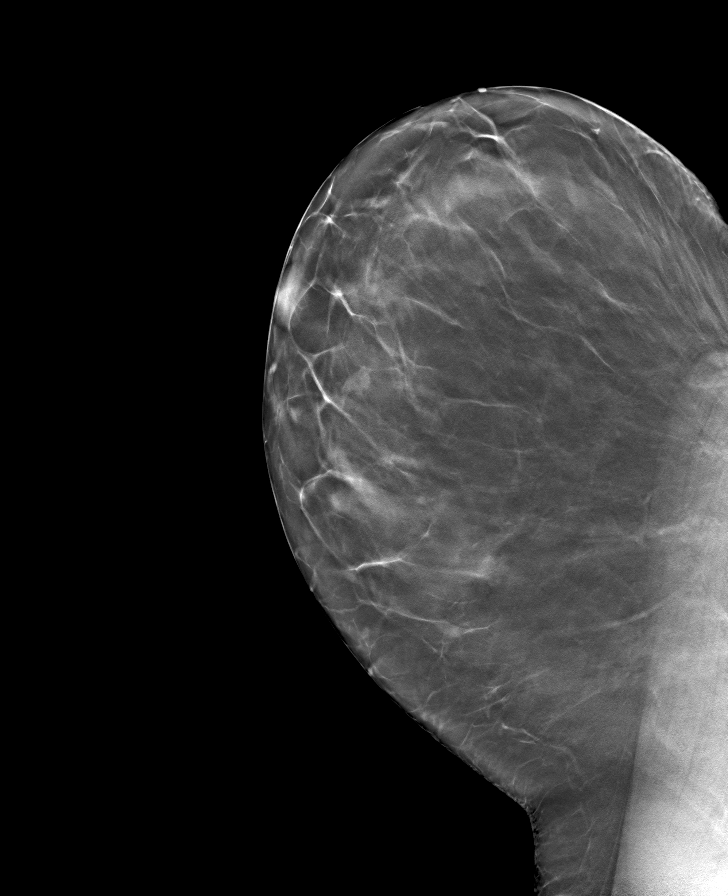

[R MLO tomo · tomo slice 45/89.0]
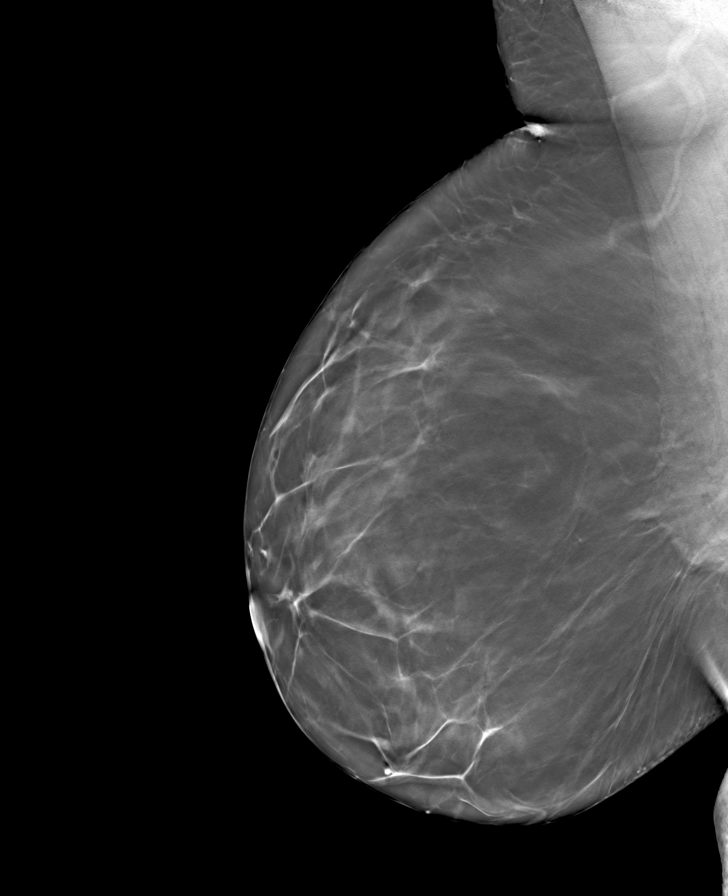

[R CC tomo · tomo slice 42/83.0]
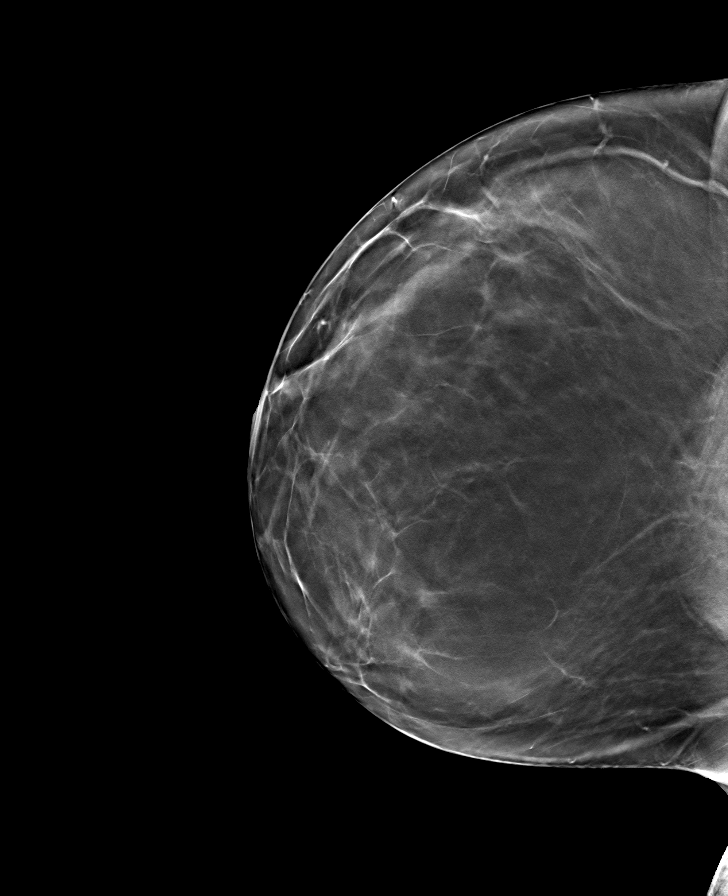

[L CC tomo · tomo slice 43/86.0]
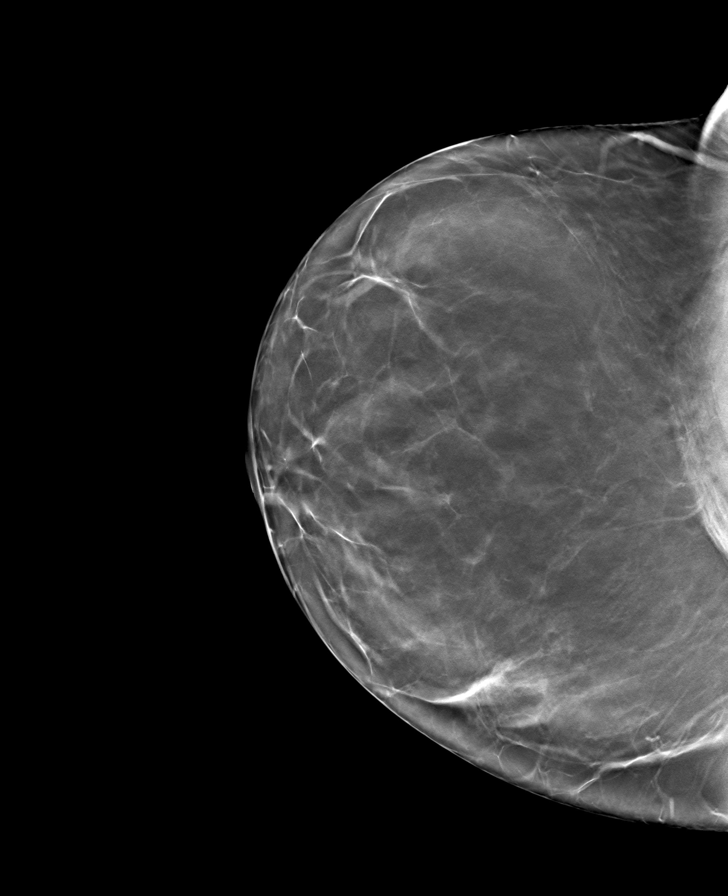

[8 of 24 positions shown; findings below may reference images not displayed]

ACR Breast Density Category b: There are scattered areas of
fibroglandular density.
FINDINGS: There are no findings suspicious for malignancy. Images were
processed with CAD.
IMPRESSION: No mammographic evidence of malignancy. A result letter of this
screening mammogram will be mailed directly to the patient.

RECOMMENDATION:
Screening mammogram in one year. (Code:[id number])

BI-RADS CATEGORY  1: Negative.

## 2021-01-18 ENCOUNTER — Encounter: Payer: Self-pay | Admitting: Gastroenterology

## 2021-02-08 DIAGNOSIS — F332 Major depressive disorder, recurrent severe without psychotic features: Secondary | ICD-10-CM | POA: Diagnosis not present

## 2021-02-09 ENCOUNTER — Other Ambulatory Visit: Payer: Self-pay | Admitting: Family Medicine

## 2021-02-09 MED ORDER — FLUOXETINE HCL 10 MG PO TABS: 10.0000 mg | ORAL_TABLET | Freq: Every day | ORAL | 3 refills | Status: DC

## 2021-02-16 ENCOUNTER — Telehealth: Payer: Self-pay | Admitting: Family Medicine

## 2021-02-16 NOTE — Telephone Encounter (Signed)
Called the patient left message to call back. Have #4 boxes of Trintellix 10 mg tablets total #28. Will put at the front desk for her to pickup.

## 2021-02-16 NOTE — Telephone Encounter (Signed)
Called the patient to inform we have samples for her. She has been taking the prozac and doing much better. She would prefer to just stay on prozac The patient declines getting the samples. Returned the 4 boxes to supply given

## 2021-02-16 NOTE — Telephone Encounter (Signed)
Called the patient to inform we did receive samples of Trintellix//called to inform left message to call back. Will give the patient #4 boxes #7 tablets per box to equal #28 tablets total per PCP instructions IWO#03212-248-25 Lot #[id number] Exp. Date Jan. 2005

## 2021-02-21 ENCOUNTER — Telehealth: Payer: Self-pay | Admitting: General Practice

## 2021-02-21 NOTE — Telephone Encounter (Signed)
Left message on voice mail for patient to contact our office to schedule Annual Exam with Dr. Ihor Dow in September 2022.

## 2021-03-03 ENCOUNTER — Ambulatory Visit (AMBULATORY_SURGERY_CENTER): Payer: Self-pay | Admitting: *Deleted

## 2021-03-03 ENCOUNTER — Other Ambulatory Visit: Payer: Self-pay

## 2021-03-03 VITALS — Ht 64.0 in | Wt 200.0 lb

## 2021-03-03 DIAGNOSIS — Z1211 Encounter for screening for malignant neoplasm of colon: Secondary | ICD-10-CM

## 2021-03-03 NOTE — Progress Notes (Signed)

## 2021-03-17 ENCOUNTER — Other Ambulatory Visit: Payer: Self-pay

## 2021-03-17 ENCOUNTER — Ambulatory Visit (AMBULATORY_SURGERY_CENTER): Payer: BC Managed Care – PPO | Admitting: Gastroenterology

## 2021-03-17 ENCOUNTER — Encounter: Payer: Self-pay | Admitting: Gastroenterology

## 2021-03-17 VITALS — BP 127/82 | HR 70 | Temp 96.6°F | Resp 19 | Ht 64.0 in | Wt 200.0 lb

## 2021-03-17 DIAGNOSIS — Z1211 Encounter for screening for malignant neoplasm of colon: Secondary | ICD-10-CM

## 2021-03-17 MED ORDER — SODIUM CHLORIDE 0.9 % IV SOLN: 500.0000 mL | INTRAVENOUS | Status: DC

## 2021-03-17 NOTE — Progress Notes (Signed)
[facility] History and Physical   Primary Care Physician:  Shelda Pal, DO   Reason for Procedure:   Screening   Plan:    Colon for eval.    Appropriate for [facility]     HPI: Kimberly Richards is a 45 y.o. female    Past Medical History:  Diagnosis Date   Abnormal Pap smear of cervix    Acne    On Aldactone   Anemia    Anxiety with depression 12/18/2017   Cancer ([place])    H/O ovarian cancer    HPV (human papilloma virus) anogenital infection    Ovarian cyst    Primary hyperparathyroidism ([place])     Past Surgical History:  Procedure Laterality Date   ABDOMINAL HYSTERECTOMY     BREAST BIOPSY Right    CESAREAN SECTION     x3   LAPAROSCOPIC HYSTERECTOMY     total   LAPAROTOMY     Ovarian mass removed  12/06/2011   Borderline Ovarian Cancer   SMALL INTESTINE SURGERY      Prior to Admission medications   Medication Sig Start Date End Date Taking? Authorizing Provider  B Complex-C-Folic Acid (HM VITAMIN B COMPLEX/VITAMIN C PO) Take 1 tablet by mouth 2 (two) times daily.    Yes [provider]  FLUoxetine (PROZAC) 10 MG tablet Take 1 tablet (10 mg total) by mouth daily. 02/09/21  Yes Shelda Pal, DO  fluticasone (FLONASE) 50 MCG/ACT nasal spray Place 2 sprays into both nostrils daily. 09/10/19  Yes Shelda Pal, DO  Melatonin 5 MG TABS Take 1 tablet by mouth at bedtime.   Yes [provider]  Multiple Vitamin (MULTIVITAMIN) tablet Take 1 tablet by mouth 2 (two) times daily.    Yes [provider]  Probiotic Product (FORTIFY PROBIOTIC WOMENS EX ST PO) Take 1 tablet by mouth daily. 01/05/18  Yes [provider]  spironolactone (ALDACTONE) 50 MG tablet Take 1 tablet (50 mg total) by mouth daily. 03/17/20  Yes Shelda Pal, DO  Azelaic Acid 15 % cream Apply topically 2 (two) times daily. After skin is thoroughly washed and patted dry, gently but thoroughly massage a thin film of azelaic  acid cream into the affected area twice daily, in the morning and evening.    [provider]  tretinoin (RETIN-A) 0.025 % cream Apply topically at bedtime.    [provider]    Current Outpatient Medications  Medication Sig Dispense Refill   B Complex-C-Folic Acid (HM VITAMIN B COMPLEX/VITAMIN C PO) Take 1 tablet by mouth 2 (two) times daily.      FLUoxetine (PROZAC) 10 MG tablet Take 1 tablet (10 mg total) by mouth daily. 30 tablet 3   fluticasone (FLONASE) 50 MCG/ACT nasal spray Place 2 sprays into both nostrils daily. 16 g 6   Melatonin 5 MG TABS Take 1 tablet by mouth at bedtime.     Multiple Vitamin (MULTIVITAMIN) tablet Take 1 tablet by mouth 2 (two) times daily.      Probiotic Product (FORTIFY PROBIOTIC WOMENS EX ST PO) Take 1 tablet by mouth daily.     spironolactone (ALDACTONE) 50 MG tablet Take 1 tablet (50 mg total) by mouth daily. 90 tablet 2   Azelaic Acid 15 % cream Apply topically 2 (two) times daily. After skin is thoroughly washed and patted dry, gently but thoroughly massage a thin film of azelaic acid cream into the affected area twice daily, in the morning and evening.  tretinoin (RETIN-A) 0.025 % cream Apply topically at bedtime.     Current Facility-Administered Medications  Medication Dose Route Frequency Provider Last Rate Last Admin   0.9 %  sodium chloride infusion  500 mL Intravenous Continuous Jackquline Denmark, MD        Allergies as of 03/17/2021 - Review Complete 03/17/2021  Allergen Reaction Noted   Stadol [butorphanol]  08/18/2016   Sulfa antibiotics  12/13/2007    Family History  Problem Relation Age of Onset   Hypertension Mother    Diabetes Mother    Hypertension Father    Diabetes Father    Hypertension Maternal Grandmother    Diabetes Maternal Grandmother    Hypertension Maternal Grandfather    Diabetes Maternal Grandfather    Cancer Paternal Grandmother        breast   Hypertension Paternal Grandmother    Diabetes  Paternal Grandmother    Hypertension Paternal Grandfather    Diabetes Paternal Grandfather    Rectal cancer Other    Stomach cancer Other    Esophageal cancer Other    Colon polyps Other    Colon cancer Other     Social History   Socioeconomic History   Marital status: Married    Spouse name: Not on file   Number of children: 3   Years of education: Not on file   Highest education level: Doctorate  Occupational History   Occupation: pharmacist  Tobacco Use   Smoking status: Never   Smokeless tobacco: Never  Vaping Use   Vaping Use: Never used  Substance and Sexual Activity   Alcohol use: Yes    Alcohol/week: 0.0 standard drinks    Comment: cocktails 2 x week   Drug use: No   Sexual activity: Yes    Partners: Male  Other Topics Concern   Not on file  Social History Narrative   Not on file   Social Determinants of Health   Financial Resource Strain: Not on file  Food Insecurity: Not on file  Transportation Needs: Not on file  Physical Activity: Not on file  Stress: Not on file  Social Connections: Not on file  Intimate Partner Violence: Not on file    Review of Systems: neg All other review of systems negative except as mentioned in the HPI.  Physical Exam: Vital signs in last 24 hours: '@VSRANGES'$ @   General:   Alert,  Well-developed, well-nourished, pleasant and cooperative in NAD Lungs:  Clear throughout to auscultation.   Heart:  Regular rate and rhythm; no murmurs, clicks, rubs,  or gallops. Abdomen:  Soft, nontender and nondistended. Normal bowel sounds.   Neuro/Psych:  Alert and cooperative. Normal mood and affect. A and O x 3    No significant changes were identified.  The patient continues to be an appropriate candidate for the planned procedure and anesthesia.   Carmell Austria, MD. [facility] 03/17/2021 11:13 AM@

## 2021-03-17 NOTE — Op Note (Signed)
[facility] Patient Name: Kimberly Richards Procedure Date: 03/17/2021 11:10 AM MRN: [id number] Endoscopist: Jackquline Denmark , MD Age: 45 Referring MD:  Date of Birth: 12/26/1975 Gender: Female Account #: [id number] Procedure:                Colonoscopy Indications:              Screening for colorectal malignant neoplasm Medicines:                Monitored Anesthesia Care Procedure:                Pre-Anesthesia Assessment:                           - Prior to the procedure, a History and Physical                            was performed, and patient medications and                            allergies were reviewed. The patient's tolerance of                            previous anesthesia was also reviewed. The risks                            and benefits of the procedure and the sedation                            options and risks were discussed with the patient.                            All questions were answered, and informed consent                            was obtained. Prior Anticoagulants: The patient has                            taken no previous anticoagulant or antiplatelet                            agents. ASA Grade Assessment: II - A patient with                            mild systemic disease. After reviewing the risks                            and benefits, the patient was deemed in                            satisfactory condition to undergo the procedure.                           After obtaining informed consent, the colonoscope  was passed under direct vision. Throughout the                            procedure, the patient's blood pressure, pulse, and                            oxygen saturations were monitored continuously. The                            Olympus [id number] Colonoscope was                            introduced through the anus and advanced to the the                            ileocolonic  anastomosis. The colonoscopy was                            performed without difficulty. The patient tolerated                            the procedure well. The quality of the bowel                            preparation was good. The neo-terminal ileum,                            neo-cecum and rectum were photographed. Scope In: 11:21:22 AM Scope Out: 11:30:37 AM Scope Withdrawal Time: 0 hours 6 minutes 21 seconds  Total Procedure Duration: 0 hours 9 minutes 15 seconds  Findings:                 There was evidence of a prior end-to-side                            ileo-colonic anastomosis in the cecum. This was                            patent and was characterized by healthy appearing                            mucosa.                           The neo-terminal ileum appeared normal.                           The exam was otherwise without abnormality on                            direct and retroflexion views.                           Non-bleeding internal hemorrhoids were found during  retroflexion and during perianal exam. The                            hemorrhoids were small and Grade III (internal                            hemorrhoids that prolapse but require manual                            reduction). Complications:            No immediate complications. Estimated Blood Loss:     Estimated blood loss: none. Impression:               - Normal colonoscopy to Ileocolic                            anastomosis.Patent end-to-side ileo-colonic                            anastomosis, characterized by healthy appearing                            mucosa.                           - Non-bleeding internal hemorrhoids.                           - No specimens collected. Recommendation:           - Patient has a contact number available for                            emergencies. The signs and symptoms of potential                            delayed  complications were discussed with the                            patient. Return to normal activities tomorrow.                            Written discharge instructions were provided to the                            patient.                           - Resume previous diet.                           - Continue present medications.                           - Repeat colonoscopy in 10 years for screening  purposes. Earlier, if with any new problems or                            change in family history.                           - Return to GI clinic PRN. She will also get in                            touch with [place] in case she starts having any                            anorectal problems.                           - The findings and recommendations were discussed                            with the patient's family. Jackquline Denmark, MD 03/17/2021 11:35:51 AM This report has been signed electronically.

## 2021-03-17 NOTE — Progress Notes (Signed)
A and O x3. Report to RN. Tolerated MAC anesthesia well. 

## 2021-03-17 NOTE — Patient Instructions (Signed)
Please read handouts provided. Continue present medications.   YOU HAD AN ENDOSCOPIC PROCEDURE TODAY AT THE [facility]:   Refer to the procedure report that was given to you for any specific questions about what was found during the examination.  If the procedure report does not answer your questions, please call your gastroenterologist to clarify.  If you requested that your care partner not be given the details of your procedure findings, then the procedure report has been included in a sealed envelope for you to review at your convenience later.  YOU SHOULD EXPECT: Some feelings of bloating in the abdomen. Passage of more gas than usual.  Walking can help get rid of the air that was put into your GI tract during the procedure and reduce the bloating. If you had a lower endoscopy (such as a colonoscopy or flexible sigmoidoscopy) you may notice spotting of blood in your stool or on the toilet paper. If you underwent a bowel prep for your procedure, you may not have a normal bowel movement for a few days.  Please Note:  You might notice some irritation and congestion in your nose or some drainage.  This is from the oxygen used during your procedure.  There is no need for concern and it should clear up in a day or so.  SYMPTOMS TO REPORT IMMEDIATELY:  Following lower endoscopy (colonoscopy or flexible sigmoidoscopy):  Excessive amounts of blood in the stool  Significant tenderness or worsening of abdominal pains  Swelling of the abdomen that is new, acute  Fever of 100F or higher   For urgent or emergent issues, a gastroenterologist can be reached at any hour by calling [phone]. Do not use MyChart messaging for urgent concerns.    DIET:  We do recommend a small meal at first, but then you may proceed to your regular diet.  Drink plenty of fluids but you should avoid alcoholic beverages for 24 hours.  ACTIVITY:  You should plan to take it easy for the rest of today and  you should NOT DRIVE or use heavy machinery until tomorrow (because of the sedation medicines used during the test).    FOLLOW UP: Our staff will call the number listed on your records 48-72 hours following your procedure to check on you and address any questions or concerns that you may have regarding the information given to you following your procedure. If we do not reach you, we will leave a message.  We will attempt to reach you two times.  During this call, we will ask if you have developed any symptoms of COVID 19. If you develop any symptoms (ie: fever, flu-like symptoms, shortness of breath, cough etc.) before then, please call [phone].  If you test positive for Covid 19 in the 2 weeks post procedure, please call and report this information to us.    If any biopsies were taken you will be contacted by phone or by letter within the next 1-3 weeks.  Please call us at [phone] if you have not heard about the biopsies in 3 weeks.    SIGNATURES/CONFIDENTIALITY: You and/or your care partner have signed paperwork which will be entered into your electronic medical record.  These signatures attest to the fact that that the information above on your After Visit Summary has been reviewed and is understood.  Full responsibility of the confidentiality of this discharge information lies with you and/or your care-partner.  

## 2021-03-17 NOTE — Progress Notes (Signed)
V/s DT I have reviewed the patient's medical history in detail and updated the computerized patient record.

## 2021-03-21 ENCOUNTER — Telehealth: Payer: Self-pay

## 2021-03-21 NOTE — Telephone Encounter (Signed)
  Follow up Call-  Call back number 03/17/2021  Post procedure Call Back phone  # [phone]  Permission to leave phone message Yes  Some recent data might be hidden     Patient questions:  Do you have a fever, pain , or abdominal swelling? No. Pain Score  0 *  Have you tolerated food without any problems? Yes.    Have you been able to return to your normal activities? Yes.    Do you have any questions about your discharge instructions: Diet   No. Medications  No. Follow up visit  No.  Do you have questions or concerns about your Care? No.  Actions: * If pain score is 4 or above: No action needed, pain <4.   Have you developed a fever since your procedure? no  2.   Have you had an respiratory symptoms (SOB or cough) since your procedure? no  3.   Have you tested positive for COVID 19 since your procedure no  4.   Have you had any family members/close contacts diagnosed with the COVID 19 since your procedure?  no   If yes to any of these questions please route to Joylene John, RN and Joella Prince, RN

## 2021-03-22 ENCOUNTER — Ambulatory Visit (INDEPENDENT_AMBULATORY_CARE_PROVIDER_SITE_OTHER): Payer: BC Managed Care – PPO | Admitting: Family Medicine

## 2021-03-22 ENCOUNTER — Encounter: Payer: Self-pay | Admitting: Family Medicine

## 2021-03-22 ENCOUNTER — Other Ambulatory Visit: Payer: Self-pay

## 2021-03-22 VITALS — BP 108/72 | HR 70 | Temp 98.0°F | Ht 64.0 in | Wt 205.5 lb

## 2021-03-22 DIAGNOSIS — E669 Obesity, unspecified: Secondary | ICD-10-CM

## 2021-03-22 DIAGNOSIS — Z0000 Encounter for general adult medical examination without abnormal findings: Secondary | ICD-10-CM

## 2021-03-22 LAB — COMPREHENSIVE METABOLIC PANEL
ALT: 27 U/L (ref 0–35)
AST: 24 U/L (ref 0–37)
Albumin: 4.1 g/dL (ref 3.5–5.2)
Alkaline Phosphatase: 83 U/L (ref 39–117)
BUN: 16 mg/dL (ref 6–23)
CO2: 28 mEq/L (ref 19–32)
Calcium: 10.2 mg/dL (ref 8.4–10.5)
Chloride: 102 mEq/L (ref 96–112)
Creatinine, Ser: 0.86 mg/dL (ref 0.40–1.20)
GFR: 80.89 mL/min (ref >60.00)
Glucose, Bld: 86 mg/dL (ref 70–99)
Potassium: 4.4 mEq/L (ref 3.5–5.1)
Sodium: 138 mEq/L (ref 135–145)
Total Bilirubin: 0.5 mg/dL (ref 0.2–1.2)
Total Protein: 7.4 g/dL (ref 6.0–8.3)

## 2021-03-22 LAB — LIPID PANEL
Cholesterol: 227 mg/dL — ABNORMAL HIGH (ref 0–200)
HDL: 74 mg/dL (ref >39.00)
LDL Cholesterol: 133 mg/dL — ABNORMAL HIGH (ref 0–99)
NonHDL: 152.84
Total CHOL/HDL Ratio: 3
Triglycerides: 99 mg/dL (ref 0.0–149.0)
VLDL: 19.8 mg/dL (ref 0.0–40.0)

## 2021-03-22 LAB — CBC
HCT: 40.6 % (ref 36.0–46.0)
Hemoglobin: 13.4 g/dL (ref 12.0–15.0)
MCHC: 33 g/dL (ref 30.0–36.0)
MCV: 87.7 fl (ref 78.0–100.0)
Platelets: 287 10*3/uL (ref 150.0–400.0)
RBC: 4.63 Mil/uL (ref 3.87–5.11)
RDW: 13.5 % (ref 11.5–15.5)
WBC: 6.3 10*3/uL (ref 4.0–10.5)

## 2021-03-22 MED ORDER — FLUOXETINE HCL 10 MG PO TABS: 10.0000 mg | ORAL_TABLET | Freq: Every day | ORAL | 2 refills | Status: DC

## 2021-03-22 NOTE — Patient Instructions (Addendum)
Give [place] 2-3 business days to get the results of your labs back.   Keep the diet clean and stay active.  If you do not hear anything about your referral in the next 1-2 weeks, call our office and ask for an update.  I recommend getting the flu shot in mid October. This suggestion would change if the CDC comes out with a different recommendation.   Consider around 75-80 g of protein daily until you see the weight loss team.   Let us know if you need anything.

## 2021-03-22 NOTE — Progress Notes (Signed)
Chief Complaint  Patient presents with   Annual Exam     Well Woman Kimberly Richards is here for a complete physical.   Her last physical was >1 year ago.  Current diet: in general, a "healthy" diet. Current exercise: walking, pilate's, elliptical. Weight is stable and she denies fatigue out of ordinary. Seatbelt? Yes  Health Maintenance Mammogram- Yes Tetanus- Yes Hep C screening- Yes HIV screening- Yes  Past Medical History:  Diagnosis Date   Abnormal Pap smear of cervix    Acne    On Aldactone   Anemia    Anxiety with depression 12/18/2017   Cancer ([place])    H/O ovarian cancer    HPV (human papilloma virus) anogenital infection    Ovarian cyst    Primary hyperparathyroidism ([place])      Past Surgical History:  Procedure Laterality Date   ABDOMINAL HYSTERECTOMY     BREAST BIOPSY Right    CESAREAN SECTION     x3   LAPAROSCOPIC HYSTERECTOMY     total   LAPAROTOMY     Ovarian mass removed  12/06/2011   Borderline Ovarian Cancer   SMALL INTESTINE SURGERY      Medications  Current Outpatient Medications on File Prior to Visit  Medication Sig Dispense Refill   Azelaic Acid 15 % cream Apply topically 2 (two) times daily. After skin is thoroughly washed and patted dry, gently but thoroughly massage a thin film of azelaic acid cream into the affected area twice daily, in the morning and evening.     B Complex-C-Folic Acid (HM VITAMIN B COMPLEX/VITAMIN C PO) Take 1 tablet by mouth 2 (two) times daily.      FLUoxetine (PROZAC) 10 MG tablet Take 1 tablet (10 mg total) by mouth daily. 30 tablet 3   fluticasone (FLONASE) 50 MCG/ACT nasal spray Place 2 sprays into both nostrils daily. 16 g 6   Melatonin 5 MG TABS Take 1 tablet by mouth at bedtime.     Multiple Vitamin (MULTIVITAMIN) tablet Take 1 tablet by mouth 2 (two) times daily.      Probiotic Product (FORTIFY PROBIOTIC WOMENS EX ST PO) Take 1 tablet by mouth daily.     spironolactone (ALDACTONE) 50 MG tablet Take 1  tablet (50 mg total) by mouth daily. 90 tablet 2   tretinoin (RETIN-A) 0.025 % cream Apply topically at bedtime.     Allergies Allergies  Allergen Reactions   Stadol [Butorphanol]    Sulfa Antibiotics     Other reaction(s): gi distress    Review of Systems: Constitutional:  no unexpected weight changes Eye:  no recent significant change in vision Ear/Nose/Mouth/Throat:  Ears:  no recent change in hearing Nose/Mouth/Throat:  no complaints of nasal congestion, no sore throat Cardiovascular: no chest pain Respiratory:  no shortness of breath Gastrointestinal:  no abdominal pain, no change in bowel habits GU:  Female: negative for dysuria or pelvic pain Musculoskeletal/Extremities:  no pain of the joints Integumentary (Skin/Breast):  no abnormal skin lesions reported Neurologic:  no headaches Endocrine:  denies fatigue Hematologic/Lymphatic:  No areas of easy bleeding  Exam BP 108/72   Pulse 70   Temp 98 F (36.7 C) (Oral)   Ht '5\' 4"'$  (1.626 m)   Wt 205 lb 8 oz (93.2 kg)   SpO2 99%   BMI 35.27 kg/m  General:  well developed, well nourished, in no apparent distress Skin:  no significant moles, warts, or growths Head:  no masses, lesions, or tenderness Eyes:  pupils equal and round, sclera anicteric without injection Ears:  canals without lesions, TMs shiny without retraction, no obvious effusion, no erythema Nose:  nares patent, septum midline, mucosa normal, and no drainage or sinus tenderness Throat/Pharynx:  lips and gingiva without lesion; tongue and uvula midline; non-inflamed pharynx; no exudates or postnasal drainage Neck: neck supple without adenopathy, thyromegaly, or masses Lungs:  clear to auscultation, breath sounds equal bilaterally, no respiratory distress Cardio:  regular rate and rhythm, no LE edema Abdomen:  abdomen soft, nontender; bowel sounds normal; no masses or organomegaly Genital: Defer to GYN Musculoskeletal:  symmetrical muscle groups noted without  atrophy or deformity Extremities:  no clubbing, cyanosis, or edema, no deformities, no skin discoloration Neuro:  gait normal; deep tendon reflexes normal and symmetric Psych: well oriented with normal range of affect and appropriate judgment/insight  Assessment and Plan  Well adult exam - Plan: CBC, Comprehensive metabolic panel, Lipid panel  Obesity (BMI 30-39.9) - Plan: Amb Ref to Medical Weight Management   Well 45 y.o. female. Counseled on diet and exercise. Other orders as above. Flu shot rec'd for Mid Oct. Advanced directive info given.  She will message with covid booster info.  Refer to [facility].  Follow up in 6 mo or prn. The patient voiced understanding and agreement to the plan.  [place], DO 03/22/21 7:57 AM

## 2021-05-30 ENCOUNTER — Other Ambulatory Visit: Payer: Self-pay | Admitting: Family Medicine

## 2021-06-06 ENCOUNTER — Other Ambulatory Visit: Payer: Self-pay | Admitting: Family Medicine

## 2021-07-27 ENCOUNTER — Other Ambulatory Visit: Payer: Self-pay | Admitting: Family Medicine

## 2021-07-27 ENCOUNTER — Encounter: Payer: Self-pay | Admitting: Family Medicine

## 2021-07-27 ENCOUNTER — Telehealth (HOSPITAL_BASED_OUTPATIENT_CLINIC_OR_DEPARTMENT_OTHER): Payer: Self-pay

## 2021-07-27 DIAGNOSIS — Z1231 Encounter for screening mammogram for malignant neoplasm of breast: Secondary | ICD-10-CM

## 2021-08-04 ENCOUNTER — Other Ambulatory Visit: Payer: Self-pay | Admitting: Family Medicine

## 2021-08-10 ENCOUNTER — Ambulatory Visit (HOSPITAL_BASED_OUTPATIENT_CLINIC_OR_DEPARTMENT_OTHER)
Admission: RE | Admit: 2021-08-10 | Discharge: 2021-08-10 | Disposition: A | Discharge status: Home / Self Care | Payer: BC Managed Care – PPO | Source: Ambulatory Visit | Attending: Family Medicine | Admitting: Family Medicine

## 2021-08-10 ENCOUNTER — Other Ambulatory Visit: Payer: Self-pay

## 2021-08-10 ENCOUNTER — Encounter (HOSPITAL_BASED_OUTPATIENT_CLINIC_OR_DEPARTMENT_OTHER): Payer: Self-pay

## 2021-08-10 DIAGNOSIS — Z1231 Encounter for screening mammogram for malignant neoplasm of breast: Secondary | ICD-10-CM | POA: Insufficient documentation

## 2021-08-29 ENCOUNTER — Other Ambulatory Visit: Payer: Self-pay | Admitting: Family Medicine

## 2021-09-23 ENCOUNTER — Ambulatory Visit: Payer: BC Managed Care – PPO | Admitting: Family Medicine

## 2021-09-27 ENCOUNTER — Ambulatory Visit (INDEPENDENT_AMBULATORY_CARE_PROVIDER_SITE_OTHER): Payer: BC Managed Care – PPO | Admitting: Family Medicine

## 2021-09-27 ENCOUNTER — Encounter: Payer: Self-pay | Admitting: Family Medicine

## 2021-09-27 VITALS — BP 108/80 | HR 76 | Temp 98.1°F | Ht 64.0 in | Wt 202.1 lb

## 2021-09-27 DIAGNOSIS — F418 Other specified anxiety disorders: Secondary | ICD-10-CM | POA: Diagnosis not present

## 2021-09-27 DIAGNOSIS — L709 Acne, unspecified: Secondary | ICD-10-CM | POA: Diagnosis not present

## 2021-09-27 MED ORDER — CLONAZEPAM 0.5 MG PO TABS
0.2500 mg | ORAL_TABLET | Freq: Two times a day (BID) | ORAL | 1 refills | Status: DC | PRN
Start: 2021-09-27 — End: 2022-04-03

## 2021-09-27 NOTE — Progress Notes (Signed)
Chief Complaint  Patient presents with   Follow-up    6 month   Anxiety    Subjective Kimberly Richards presents for f/u anxiety/depression.  Pt is currently being treated with Prozac 10 mg/d.  Reports doing well overall since treatment. Has intermittent bouts of high anxiety, was on Klonopin in the past that worked well, would like to restart.  No thoughts of harming self or others. No self-medication with alcohol, prescription drugs or illicit drugs. Going to pilates.  Pt is following with a counselor/psychologist.  Past Medical History:  Diagnosis Date   Abnormal Pap smear of cervix    Acne    On Aldactone   Anemia    Anxiety with depression 12/18/2017   Cancer ([place])    H/O ovarian cancer    HPV (human papilloma virus) anogenital infection    Ovarian cyst    Primary hyperparathyroidism ([place])    Allergies as of 09/27/2021       Reactions   Stadol [butorphanol]    Sulfa Antibiotics    Other reaction(s): gi distress        Medication List        Accurate as of September 27, 2021  7:49 AM. If you have any questions, ask your nurse or doctor.          Azelaic Acid 15 % gel Apply topically 2 (two) times daily. After skin is thoroughly washed and patted dry, gently but thoroughly massage a thin film of azelaic acid cream into the affected area twice daily, in the morning and evening.   FLUoxetine 10 MG tablet Commonly known as: PROZAC TAKE ONE TABLET BY MOUTH EVERY DAY   fluticasone 50 MCG/ACT nasal spray Commonly known as: FLONASE Place 2 sprays into both nostrils daily.   FORTIFY PROBIOTIC WOMENS EX ST PO Take 1 tablet by mouth daily.   HM VITAMIN B COMPLEX/VITAMIN C PO Take 1 tablet by mouth 2 (two) times daily.   melatonin 5 MG Tabs Take 1 tablet by mouth at bedtime.   multivitamin tablet Take 1 tablet by mouth 2 (two) times daily.   spironolactone 50 MG tablet Commonly known as: ALDACTONE TAKE ONE TABLET BY MOUTH EVERY DAY   tretinoin  0.025 % cream Commonly known as: RETIN-A Apply topically at bedtime.        Exam BP 108/80    Pulse 76    Temp 98.1 F (36.7 C) (Oral)    Ht 5\' 4"  (1.626 m)    Wt 202 lb 2 oz (91.7 kg)    SpO2 99%    BMI 34.69 kg/m  General:  well developed, well nourished, in no apparent distress Heart: RRR Lungs: CTAB. No respiratory distress Psych: well oriented with normal range of affect and age-appropriate judgement/insight, alert and oriented x4.  Assessment and Plan  Anxiety with depression  Acne, unspecified acne type  Chronic, uncontrolled. Cont Prozac 10 mg/d. Add Klonopin 0.25-0.5 mg bid prn. Cont w counseling. Cont exercising. Stop Aldactone and see how she does off of dairy. Consider BP washes prn during dairy consumption.  F/u in 6 mo for CPE or prn. The patient voiced understanding and agreement to the plan.  [place], DO 09/27/21 7:49 AM

## 2021-09-27 NOTE — Patient Instructions (Addendum)
Stop the spironolactone and see how your acne does without consuming dairy.   Consider benzoyl peroxide washes during dairy consumption.   Stay active.   Let us know if you need anything.

## 2022-03-28 ENCOUNTER — Encounter: Payer: BC Managed Care – PPO | Admitting: Family Medicine

## 2022-04-03 ENCOUNTER — Encounter: Payer: Self-pay | Admitting: Family Medicine

## 2022-04-03 ENCOUNTER — Ambulatory Visit (INDEPENDENT_AMBULATORY_CARE_PROVIDER_SITE_OTHER): Payer: BC Managed Care – PPO | Admitting: Family Medicine

## 2022-04-03 ENCOUNTER — Other Ambulatory Visit: Payer: Self-pay | Admitting: Family Medicine

## 2022-04-03 VITALS — BP 108/70 | HR 69 | Temp 98.0°F | Ht 64.0 in | Wt 203.2 lb

## 2022-04-03 DIAGNOSIS — F418 Other specified anxiety disorders: Secondary | ICD-10-CM

## 2022-04-03 DIAGNOSIS — Z0000 Encounter for general adult medical examination without abnormal findings: Secondary | ICD-10-CM

## 2022-04-03 DIAGNOSIS — E669 Obesity, unspecified: Secondary | ICD-10-CM | POA: Diagnosis not present

## 2022-04-03 DIAGNOSIS — L709 Acne, unspecified: Secondary | ICD-10-CM | POA: Diagnosis not present

## 2022-04-03 LAB — COMPREHENSIVE METABOLIC PANEL
ALT: 23 U/L (ref 0–35)
AST: 21 U/L (ref 0–37)
Albumin: 4.5 g/dL (ref 3.5–5.2)
Alkaline Phosphatase: 84 U/L (ref 39–117)
BUN: 15 mg/dL (ref 6–23)
CO2: 25 mEq/L (ref 19–32)
Calcium: 11.2 mg/dL — ABNORMAL HIGH (ref 8.4–10.5)
Chloride: 98 mEq/L (ref 96–112)
Creatinine, Ser: 0.87 mg/dL (ref 0.40–1.20)
GFR: 79.2 mL/min (ref >60.00)
Glucose, Bld: 91 mg/dL (ref 70–99)
Potassium: 4.2 mEq/L (ref 3.5–5.1)
Sodium: 136 mEq/L (ref 135–145)
Total Bilirubin: 0.6 mg/dL (ref 0.2–1.2)
Total Protein: 8 g/dL (ref 6.0–8.3)

## 2022-04-03 LAB — LIPID PANEL
Cholesterol: 251 mg/dL — ABNORMAL HIGH (ref 0–200)
HDL: 75.3 mg/dL (ref >39.00)
LDL Cholesterol: 154 mg/dL — ABNORMAL HIGH (ref 0–99)
NonHDL: 175.66
Total CHOL/HDL Ratio: 3
Triglycerides: 109 mg/dL (ref 0.0–149.0)
VLDL: 21.8 mg/dL (ref 0.0–40.0)

## 2022-04-03 LAB — CBC
HCT: 42.9 % (ref 36.0–46.0)
Hemoglobin: 13.9 g/dL (ref 12.0–15.0)
MCHC: 32.5 g/dL (ref 30.0–36.0)
MCV: 88.3 fl (ref 78.0–100.0)
Platelets: 290 10*3/uL (ref 150.0–400.0)
RBC: 4.85 Mil/uL (ref 3.87–5.11)
RDW: 14.1 % (ref 11.5–15.5)
WBC: 5.7 10*3/uL (ref 4.0–10.5)

## 2022-04-03 MED ORDER — CLONAZEPAM 0.5 MG PO TABS: 0.2500 mg | ORAL_TABLET | Freq: Two times a day (BID) | ORAL | 1 refills | Status: DC | PRN

## 2022-04-03 MED ORDER — SPIRONOLACTONE 50 MG PO TABS: 50.0000 mg | ORAL_TABLET | Freq: Every day | ORAL | 2 refills | Status: DC

## 2022-04-03 MED ORDER — FLUOXETINE HCL 10 MG PO TABS: 10.0000 mg | ORAL_TABLET | Freq: Every day | ORAL | 2 refills | Status: DC

## 2022-04-03 NOTE — Patient Instructions (Addendum)
Give [place] 2-3 business days to get the results of your labs back.   Keep the diet clean and stay active.  I recommend getting the flu shot in mid October. This suggestion would change if the CDC comes out with a different recommendation.   If you do not hear anything about your referral in the next 1-2 weeks, call our office and ask for an update.  Please get me a copy of your advanced directive form at your convenience.   Let us know if you need anything.

## 2022-04-03 NOTE — Progress Notes (Signed)
Chief Complaint  Patient presents with   Annual Exam    Sound sensitivity      Well Woman Kimberly Richards is here for a complete physical.   Her last physical was >1 year ago.  Current diet: in general, a "healthy" diet. Current exercise: pilates. Weight is stable and she denies fatigue out of ordinary. Seatbelt? Yes Advanced directive? No  Health Maintenance Pap/HPV- No, hysterectomy Mammogram- Yes Tetanus- Yes Hep C screening- Yes HIV screening- Yes  Past Medical History:  Diagnosis Date   Abnormal Pap smear of cervix    Acne    On Aldactone   Anemia    Anxiety with depression 12/18/2017   Cancer ([place])    H/O ovarian cancer    HPV (human papilloma virus) anogenital infection    Ovarian cyst    Primary hyperparathyroidism ([place])      Past Surgical History:  Procedure Laterality Date   ABDOMINAL HYSTERECTOMY     BREAST BIOPSY Right    CESAREAN SECTION     x3   LAPAROSCOPIC HYSTERECTOMY     total   LAPAROTOMY     Ovarian mass removed  12/06/2011   Borderline Ovarian Cancer   SMALL INTESTINE SURGERY      Medications  Current Outpatient Medications on File Prior to Visit  Medication Sig Dispense Refill   Azelaic Acid 15 % cream Apply topically 2 (two) times daily. After skin is thoroughly washed and patted dry, gently but thoroughly massage a thin film of azelaic acid cream into the affected area twice daily, in the morning and evening.     B Complex-C-Folic Acid (HM VITAMIN B COMPLEX/VITAMIN C PO) Take 1 tablet by mouth 2 (two) times daily.      clonazePAM (KLONOPIN) 0.5 MG tablet Take 0.5-1 tablets (0.25-0.5 mg total) by mouth 2 (two) times daily as needed for anxiety. 30 tablet 1   FLUoxetine (PROZAC) 10 MG tablet TAKE ONE TABLET BY MOUTH EVERY DAY 30 tablet 1   fluticasone (FLONASE) 50 MCG/ACT nasal spray Place 2 sprays into both nostrils daily. 16 g 6   Multiple Vitamin (MULTIVITAMIN) tablet Take 1 tablet by mouth 2 (two) times daily.      Probiotic  Product (FORTIFY PROBIOTIC WOMENS EX ST PO) Take 1 tablet by mouth daily.     tretinoin (RETIN-A) 0.025 % cream Apply topically at bedtime.     VALERIAN PO Take by mouth.      Allergies Allergies  Allergen Reactions   Stadol [Butorphanol]    Sulfa Antibiotics     Other reaction(s): gi distress    Review of Systems: Constitutional:  no unexpected weight changes Eye:  no recent significant change in vision Ear/Nose/Mouth/Throat:  Ears:  no recent change in hearing Nose/Mouth/Throat:  no complaints of nasal congestion, no sore throat Cardiovascular: no chest pain Respiratory:  no shortness of breath Gastrointestinal:  no abdominal pain, no change in bowel habits GU:  Female: negative for dysuria or pelvic pain Musculoskeletal/Extremities:  no pain of the joints Integumentary (Skin/Breast):  no abnormal skin lesions reported Neurologic:  no headaches Endocrine:  denies fatigue Hematologic/Lymphatic:  No areas of easy bleeding  Exam BP 108/70   Pulse 69   Temp 98 F (36.7 C) (Oral)   Ht '5\' 4"'$  (1.626 m)   Wt 203 lb 4 oz (92.2 kg)   SpO2 97%   BMI 34.89 kg/m  General:  well developed, well nourished, in no apparent distress Skin:  no significant moles, warts, or  growths Head:  no masses, lesions, or tenderness Eyes:  pupils equal and round, sclera anicteric without injection Ears:  canals without lesions, TMs shiny without retraction, no obvious effusion, no erythema Nose:  nares patent, septum midline, mucosa normal, and no drainage or sinus tenderness Throat/Pharynx:  lips and gingiva without lesion; tongue and uvula midline; non-inflamed pharynx; no exudates or postnasal drainage Neck: neck supple without adenopathy, thyromegaly, or masses Lungs:  clear to auscultation, breath sounds equal bilaterally, no respiratory distress Cardio:  regular rate and rhythm, no LE edema Abdomen:  abdomen soft, nontender; bowel sounds normal; no masses or organomegaly Genital: Defer to  GYN Musculoskeletal:  symmetrical muscle groups noted without atrophy or deformity Extremities:  no clubbing, cyanosis, or edema, no deformities, no skin discoloration Neuro:  gait normal; deep tendon reflexes normal and symmetric Psych: well oriented with normal range of affect and appropriate judgment/insight  Assessment and Plan  Well adult exam - Plan: CBC, Comprehensive metabolic panel, Lipid panel  Anxiety with depression - Plan: FLUoxetine (PROZAC) 10 MG tablet, clonazePAM (KLONOPIN) 0.5 MG tablet  Acne, unspecified acne type - Plan: spironolactone (ALDACTONE) 50 MG tablet  Obesity (BMI 30-39.9) - Plan: Amb Ref to Medical Weight Management   Well 46 y.o. female. Counseled on diet and exercise. Advanced directive form provided today.  Other orders as above. Refer [facility] clinic.  Follow up in 6 mo. The patient voiced understanding and agreement to the plan.  [place], DO 04/03/22 10:31 AM

## 2022-05-08 ENCOUNTER — Ambulatory Visit (INDEPENDENT_AMBULATORY_CARE_PROVIDER_SITE_OTHER): Payer: Self-pay | Admitting: Internal Medicine

## 2022-05-08 VITALS — BP 115/80 | HR 73 | Ht 63.0 in | Wt 200.4 lb

## 2022-05-08 DIAGNOSIS — E7800 Pure hypercholesterolemia, unspecified: Secondary | ICD-10-CM

## 2022-05-08 DIAGNOSIS — E669 Obesity, unspecified: Secondary | ICD-10-CM

## 2022-05-08 DIAGNOSIS — Z0289 Encounter for other administrative examinations: Secondary | ICD-10-CM

## 2022-05-08 NOTE — Progress Notes (Signed)
Office: [phone]  /  Fax: [phone]  Initial Visit  Kimberly Richards was seen in clinic today to evaluate for obesity. She is interested in losing weight to improve overall health and reduce the risk of weight related complications.  She was referred by her PCP.  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  She also reports gaining weight with her 3 pregnancies.  She gets frustrated at time as she has been embracing healthy lifestyle including eating whole foods and doing Pilates up to 5 days a week but despite her efforts she is unable to lose weight.  She reports trying many diets and strategies.  She had an elevated LDL cholesterol in the past but this was nonfasting.  She denies any problems with gestational diabetes.  She has family history of obesity.  Reports having fatigue in the past and this has improved with Pilates.  She presents today to review program treatment options, initial physical assessment, and evaluation.      Past medical history includes:   Past Medical History:  Diagnosis Date   Abnormal Pap smear of cervix    Acne    On Aldactone   Anemia    Anxiety with depression 12/18/2017   Cancer ([place])    H/O ovarian cancer    HPV (human papilloma virus) anogenital infection    Ovarian cyst    Primary hyperparathyroidism ([place])      Objective:   BP 115/80   Pulse 73   Ht '5\' 3"'$  (1.6 m)   Wt 200 lb 6.4 oz (90.9 kg)   SpO2 96%   BMI 35.50 kg/m  She was weighed on the bioimpedance scale:  Body mass index is 35.5 kg/m.  General:  Alert, oriented and cooperative. Patient is in no acute distress.  Respiratory: Normal respiratory effort, no problems with respiration noted  Extremities: Normal range of motion.    Mental Status: Normal mood and affect. Normal behavior. Normal judgment and thought content.   Assessment and Plan:  1. Obesity (BMI 30-39.9)  2. Elevated LDL cholesterol level   We reviewed weight,  associated conditions and contributing factors.  She does not have any serious complications.  For the most part she is trying to eat healthy and stay active but despite this she is struggling with not losing weight.  .  We discussed the benefits of a reduced calorie meal plan that is high in protein. But we first have to assess her BMR.  We also reviewed the benefits of behavioral changes and adjunct role of pharmacotherapy.  We will assess for cardiometabolic complications and repeat her lipid panel in a fasting state when she comes in for blood work.    Obesity Treatment Plan:  She will work on garnering support from family and friends to begin weight loss journey. Work on eliminating or reducing the presence of highly processed, calorie dense foods in the home. Complete provided nutritional and psychosocial assessment questionnaire.    [name] will follow up in the next 1-2 weeks to review the above steps and continue evaluation and treatment.  Obesity Education Performed Today:  She was weighed on the bioimpedance scale and results were discussed and documented in the synopsis.  We discussed obesity as a disease and the importance of a more detailed evaluation of all the factors contributing to the disease.  We discussed the importance of long term lifestyle changes which include nutrition, exercise and behavioral modifications  as well as the importance of customizing this to her specific health and social needs.  We discussed the benefits of reaching a healthier weight to alleviate the symptoms of existing conditions and reduce the risks of the biomechanical, metabolic and psychological effects of obesity.  We discussed the goals of this program is to improve her overall health and not simply achieve a specific BMI.  Frequent visits are very important to patient success. I plan to see her every 2 weeks for the first 3 months and then evaluate the visit frequency after that time.  I explained obesity is a life-long chronic disease and long term treatments would be required. Medications to help her follow his eating plan may be offered as appropriate but are not required. All medication decisions will be made together after the initial workup is done and benefits and side effects are discussed in depth.  The clinic rules were reviewed including the late policy, cancellation policy, no show and program fees.  [name] appears to be in the action stage of change and states they are ready to start intensive lifestyle modifications and behavioral modifications.  30 minutes was spent today on this visit including the above counseling, pre-visit chart review, and post-visit documentation.  Thomes Dinning, MD

## 2022-05-22 ENCOUNTER — Encounter (INDEPENDENT_AMBULATORY_CARE_PROVIDER_SITE_OTHER): Payer: Self-pay | Admitting: Internal Medicine

## 2022-05-22 DIAGNOSIS — Z0289 Encounter for other administrative examinations: Secondary | ICD-10-CM

## 2022-06-05 ENCOUNTER — Ambulatory Visit (INDEPENDENT_AMBULATORY_CARE_PROVIDER_SITE_OTHER): Payer: Self-pay | Admitting: Internal Medicine

## 2022-06-13 ENCOUNTER — Ambulatory Visit (INDEPENDENT_AMBULATORY_CARE_PROVIDER_SITE_OTHER): Payer: BC Managed Care – PPO | Admitting: Internal Medicine

## 2022-06-13 ENCOUNTER — Encounter (INDEPENDENT_AMBULATORY_CARE_PROVIDER_SITE_OTHER): Payer: Self-pay | Admitting: Internal Medicine

## 2022-06-13 VITALS — BP 122/81 | HR 62 | Temp 98.2°F | Ht 63.0 in | Wt 198.0 lb

## 2022-06-13 DIAGNOSIS — E785 Hyperlipidemia, unspecified: Secondary | ICD-10-CM | POA: Diagnosis not present

## 2022-06-13 DIAGNOSIS — Z6835 Body mass index (BMI) 35.0-35.9, adult: Secondary | ICD-10-CM

## 2022-06-13 DIAGNOSIS — E669 Obesity, unspecified: Secondary | ICD-10-CM | POA: Diagnosis not present

## 2022-06-13 DIAGNOSIS — R0602 Shortness of breath: Secondary | ICD-10-CM

## 2022-06-13 DIAGNOSIS — Z1331 Encounter for screening for depression: Secondary | ICD-10-CM | POA: Diagnosis not present

## 2022-06-13 DIAGNOSIS — R5383 Other fatigue: Secondary | ICD-10-CM

## 2022-06-14 DIAGNOSIS — E785 Hyperlipidemia, unspecified: Secondary | ICD-10-CM | POA: Diagnosis not present

## 2022-06-14 DIAGNOSIS — R5383 Other fatigue: Secondary | ICD-10-CM | POA: Diagnosis not present

## 2022-06-14 DIAGNOSIS — E669 Obesity, unspecified: Secondary | ICD-10-CM | POA: Diagnosis not present

## 2022-06-15 LAB — COMPREHENSIVE METABOLIC PANEL
ALT: 22 IU/L (ref 0–32)
AST: 22 IU/L (ref 0–40)
Albumin/Globulin Ratio: 1.5 (ref 1.2–2.2)
Albumin: 4.8 g/dL (ref 3.9–4.9)
Alkaline Phosphatase: 85 IU/L (ref 44–121)
BUN/Creatinine Ratio: 18 (ref 9–23)
BUN: 16 mg/dL (ref 6–24)
Bilirubin Total: 0.3 mg/dL (ref 0.0–1.2)
CO2: 22 mmol/L (ref 20–29)
Calcium: 11.8 mg/dL — ABNORMAL HIGH (ref 8.7–10.2)
Chloride: 102 mmol/L (ref 96–106)
Creatinine, Ser: 0.9 mg/dL (ref 0.57–1.00)
Globulin, Total: 3.2 g/dL (ref 1.5–4.5)
Glucose: 97 mg/dL (ref 70–99)
Potassium: 4.3 mmol/L (ref 3.5–5.2)
Sodium: 140 mmol/L (ref 134–144)
Total Protein: 8 g/dL (ref 6.0–8.5)
eGFR: 79 mL/min/{1.73_m2} (ref >59)

## 2022-06-15 LAB — INSULIN, RANDOM: INSULIN: 13 u[IU]/mL (ref 2.6–24.9)

## 2022-06-15 LAB — LIPID PANEL WITH LDL/HDL RATIO
Cholesterol, Total: 260 mg/dL — ABNORMAL HIGH (ref 100–199)
HDL: 77 mg/dL (ref >39)
LDL Chol Calc (NIH): 166 mg/dL — ABNORMAL HIGH (ref 0–99)
LDL/HDL Ratio: 2.2 ratio (ref 0.0–3.2)
Triglycerides: 99 mg/dL (ref 0–149)
VLDL Cholesterol Cal: 17 mg/dL (ref 5–40)

## 2022-06-15 LAB — VITAMIN D 25 HYDROXY (VIT D DEFICIENCY, FRACTURES): Vit D, 25-Hydroxy: 30.2 ng/mL (ref 30.0–100.0)

## 2022-06-15 LAB — TSH: TSH: 1.83 u[IU]/mL (ref 0.450–4.500)

## 2022-06-15 LAB — HEMOGLOBIN A1C
Est. average glucose Bld gHb Est-mCnc: 108 mg/dL
Hgb A1c MFr Bld: 5.4 % (ref 4.8–5.6)

## 2022-06-15 LAB — VITAMIN B12: Vitamin B-12: 1450 pg/mL — ABNORMAL HIGH (ref 232–1245)

## 2022-07-02 NOTE — Progress Notes (Signed)
Chief Complaint:   OBESITY EASTYN SKALLA (MR# [id number]) is a 46 y.o. female who presents for evaluation and treatment of obesity and related comorbidities. Current BMI is Body mass index is 35.07 kg/m. Maeola has been struggling with her weight for many years and has been unsuccessful in either losing weight, maintaining weight loss, or reaching her healthy weight goal.  Verdie is currently in the action stage of change and ready to dedicate time achieving and maintaining a healthier weight. Parthena is interested in becoming our patient and working on intensive lifestyle modifications including (but not limited to) diet and exercise for weight loss.  Sumi's habits were reviewed today and are as follows: Her family eats meals together, she thinks her family will eat healthier with her, her desired weight loss is 71 lbs, she has been heavy most of her life, she started gaining weight as a child, her heaviest weight ever was 200 pounds, she has significant food cravings issues, she skips meals frequently, she is frequently drinking liquids with calories, she frequently makes poor food choices, and she struggles with emotional eating.  Depression Screen Andreka's Food and Mood (modified PHQ-9) score was 5.     06/13/2022    7:30 AM  Depression screen PHQ 2/9  Decreased Interest 1  Down, Depressed, Hopeless 1  PHQ - 2 Score 2  Altered sleeping 0  Tired, decreased energy 0  Change in appetite 1  Feeling bad or failure about yourself  2  Trouble concentrating 0  Moving slowly or fidgety/restless 0  Suicidal thoughts 0  PHQ-9 Score 5  Difficult doing work/chores Not difficult at all   Subjective:   1. Other fatigue Discussed labs with patient today. Atiya admits to daytime somnolence and admits to waking up still tired. Patient has a history of symptoms of daytime fatigue and morning fatigue. Anayi generally gets 7 hours of sleep per night, and states that  she has generally restful sleep. Snoring is present. Apneic episodes are not present. Epworth Sleepiness Score is 2.   2. SOB (shortness of breath) on exertion Aliciana notes increasing shortness of breath with exercising and seems to be worsening over time with weight gain. She notes getting out of breath sooner with activity than she used to. This has gotten worse recently. Spenser denies shortness of breath at rest or orthopnea.  3. Hyperlipidemia, unspecified hyperlipidemia type Pt with elevated LDL cholesterol.  Assessment/Plan:   1. Other fatigue Terecia does feel that her weight is causing her energy to be lower than it should be. Fatigue may be related to obesity, depression or many other causes. Labs will be ordered, and in the meanwhile, Raeann will focus on self care including making healthy food choices, increasing physical activity and focusing on stress reduction.  Lab/Orders today or future: - VITAMIN D 25 Hydroxy (Vit-D Deficiency, Fractures) - TSH - Insulin, random - Comprehensive metabolic panel - Vitamin Q76 - EKG 12-Lead  2. SOB (shortness of breath) on exertion Raliyah does feel that she gets out of breath more easily that she used to when she exercises. Shelitha's shortness of breath appears to be obesity related and exercise induced. She has agreed to work on weight loss and gradually increase exercise to treat her exercise induced shortness of breath. Will continue to monitor closely.  3. Hyperlipidemia, unspecified hyperlipidemia type Weight loss therapy.  Assess cardiovascular risk at next office visit.  Lab/Orders today or future: - TSH - Lipid Panel With LDL/HDL  Ratio - Hemoglobin A1c  4.  Depression screening Signa had a positive depression screening. Depression is commonly associated with obesity and often results in emotional eating behaviors. We will monitor this closely and work on CBT to help improve the non-hunger eating patterns. Referral  to Psychology may be required if no improvement is seen as she continues in our clinic  5. Obesity (BMI 30-39.9) - Insulin, random - Hemoglobin A1c - VITAMIN D 25 Hydroxy (Vit-D Deficiency, Fractures)  Moranda is currently in the action stage of change and her goal is to continue with weight loss efforts. I recommend Franci begin the structured treatment plan as follows:  She has agreed to following a lower carbohydrate, vegetable and lean protein rich diet plan.  Exercise goals:  As is    Behavioral modification strategies: increasing lean protein intake, decreasing simple carbohydrates, increasing water intake, no skipping meals, meal planning and cooking strategies, keeping healthy foods in the home, better snacking choices, avoiding temptations, and planning for success.  She was informed of the importance of frequent follow-up visits to maximize her success with intensive lifestyle modifications for her multiple health conditions. She was informed we would discuss her lab results at her next visit unless there is a critical issue that needs to be addressed sooner. Likisha agreed to keep her next visit at the agreed upon time to discuss these results.  Objective:   Blood pressure 122/81, pulse 62, temperature 98.2 F (36.8 C), height '5\' 3"'$  (1.6 m), weight 198 lb (89.8 kg), SpO2 100 %. Body mass index is 35.07 kg/m.  EKG: Normal sinus rhythm, rate 71.  Indirect Calorimeter completed today shows a VO2 of 260 and a REE of 1800.  Her calculated basal metabolic rate is 0354 thus her basal metabolic rate is better than expected.  General: Cooperative, alert, well developed, in no acute distress. HEENT: Conjunctivae and lids unremarkable. Cardiovascular: Regular rhythm.  Lungs: Normal work of breathing. Neurologic: No focal deficits.   Lab Results  Component Value Date   CREATININE 0.90 06/14/2022   BUN 16 06/14/2022   NA 140 06/14/2022   K 4.3 06/14/2022   CL 102 06/14/2022    CO2 22 06/14/2022   Lab Results  Component Value Date   ALT 22 06/14/2022   AST 22 06/14/2022   ALKPHOS 85 06/14/2022   BILITOT 0.3 06/14/2022   Lab Results  Component Value Date   HGBA1C 5.4 06/14/2022   HGBA1C 5.3 02/10/2016   Lab Results  Component Value Date   INSULIN 13.0 06/14/2022   Lab Results  Component Value Date   TSH 1.830 06/14/2022   Lab Results  Component Value Date   CHOL 260 (H) 06/14/2022   HDL 77 06/14/2022   LDLCALC 166 (H) 06/14/2022   TRIG 99 06/14/2022   CHOLHDL 3 04/03/2022   Lab Results  Component Value Date   WBC 5.7 04/03/2022   HGB 13.9 04/03/2022   HCT 42.9 04/03/2022   MCV 88.3 04/03/2022   PLT 290.0 04/03/2022    Attestation Statements:   Reviewed by clinician on day of visit: allergies, medications, problem list, medical history, surgical history, family history, social history, and previous encounter notes.  Time spent on visit including pre-visit chart review and post-visit charting and care was 40 minutes.   I, Kathlene November, BS, CMA, am acting as transcriptionist for Thomes Dinning, MD.  I have reviewed the above documentation for accuracy and completeness, and I agree with the above. -Thomes Dinning, MD

## 2022-07-03 ENCOUNTER — Ambulatory Visit (INDEPENDENT_AMBULATORY_CARE_PROVIDER_SITE_OTHER): Payer: BC Managed Care – PPO | Admitting: Internal Medicine

## 2022-07-03 ENCOUNTER — Encounter (INDEPENDENT_AMBULATORY_CARE_PROVIDER_SITE_OTHER): Payer: Self-pay | Admitting: Internal Medicine

## 2022-07-03 DIAGNOSIS — E7849 Other hyperlipidemia: Secondary | ICD-10-CM

## 2022-07-03 DIAGNOSIS — E559 Vitamin D deficiency, unspecified: Secondary | ICD-10-CM | POA: Diagnosis not present

## 2022-07-03 DIAGNOSIS — Z6834 Body mass index (BMI) 34.0-34.9, adult: Secondary | ICD-10-CM

## 2022-07-03 DIAGNOSIS — E669 Obesity, unspecified: Secondary | ICD-10-CM | POA: Diagnosis not present

## 2022-07-03 DIAGNOSIS — E785 Hyperlipidemia, unspecified: Secondary | ICD-10-CM | POA: Insufficient documentation

## 2022-07-03 HISTORY — DX: Other hyperlipidemia: E78.49

## 2022-07-03 NOTE — Progress Notes (Deleted)
The 10-year ASCVD risk score (Arnett DK, et al., 2019) is: 0.7%   Values used to calculate the score:     Age: 47 years     Sex: Female     Is Non-Hispanic African American: Yes     Diabetic: No     Tobacco smoker: No     Systolic Blood Pressure: 010 mmHg     Is BP treated: No     HDL Cholesterol: 77 mg/dL     Total Cholesterol: 260 mg/dL

## 2022-07-12 NOTE — Progress Notes (Signed)
Chief Complaint:   OBESITY Kimberly Richards is here to discuss her progress with her obesity treatment plan along with follow-up of her obesity related diagnoses. Kimberly Richards is on the Category 2 Plan and states she is following her eating plan approximately 80% of the time. Kimberly Richards states she is doing pilates for 50 minutes 4-5 times per week.  Today's visit was #: 2 Starting weight: 198 lbs Starting date: 06/13/2022 Today's weight: 195 lbs Today's date: 07/03/2022 Total lbs lost to date: 3 Total lbs lost since last in-office visit: 3  Interim History: Kimberly Richards is doing well.  She has found adhering to the meal plan easy.  She reports adequate satiety and satiation.  She is also making healthier choices when she is eating out with her children.  No abnormal cravings.  Her A1c, insulin, and fasting blood glucose are all within normal limits.  Subjective:   1. Hypercalcemia Mildly elevated and asymptomatic.  She has a history of primary hyperparathyroidism.  Her vitamin D level was 30.  She denies kidney stones.  She has no bone density on file.  She was seen by Endocrinologist.  I discussed labs with the patient today.  2. Other hyperlipidemia Kimberly Richards's last LDL was 160.  She has a family history of ASCVD.  Personal ASCVD score is 7%.  I discussed labs with the patient today.  3. Vitamin D deficiency Kimberly Richards's last Vitamin D level was 30. Her diagnosis is associated with adiposopathy and may contribute to leptin resistance. I discussed labs with the patient today.    Assessment/Plan:   1. Hypercalcemia CTM. Kimberly Richards is to not replace vitamin until consulted with Endocrinology.   2. Other hyperlipidemia Kimberly Richards will work on decreasing saturated fats to less than 10% of calories.  She may benefit from statin therapy for cardiovascular risk reduction.  3. Vitamin D deficiency Kimberly Richards is to not supplement at present. Consult Endocrinology due to presence of increased calcium.   4.  Obesity BMI 34.6 Kimberly Richards is currently in the action stage of change. As such, her goal is to continue with weight loss efforts. She has agreed to the Category 2 Plan.   Breakfast options and Eating Out guide were given.   Exercise goals: As is, add weights once a week at [employer].   Behavioral modification strategies: increasing lean protein intake, increasing water intake, decreasing eating out, avoiding temptations, and planning for success.  Kimberly Richards has agreed to follow-up with our clinic in 2 to 3 weeks. She was informed of the importance of frequent follow-up visits to maximize her success with intensive lifestyle modifications for her multiple health conditions.   Objective:   Blood pressure 117/70, pulse 67, temperature 98.7 F (37.1 C), height '5\' 3"'$  (1.6 m), weight 195 lb 9.6 oz (88.7 kg), SpO2 96 %. Body mass index is 34.65 kg/m.  General: Cooperative, alert, well developed, in no acute distress. HEENT: Conjunctivae and lids unremarkable. Cardiovascular: Regular rhythm.  Lungs: Normal work of breathing. Neurologic: No focal deficits.   Lab Results  Component Value Date   CREATININE 0.90 06/14/2022   BUN 16 06/14/2022   NA 140 06/14/2022   K 4.3 06/14/2022   CL 102 06/14/2022   CO2 22 06/14/2022   Lab Results  Component Value Date   ALT 22 06/14/2022   AST 22 06/14/2022   ALKPHOS 85 06/14/2022   BILITOT 0.3 06/14/2022   Lab Results  Component Value Date   HGBA1C 5.4 06/14/2022   HGBA1C 5.3 02/10/2016  Lab Results  Component Value Date   INSULIN 13.0 06/14/2022   Lab Results  Component Value Date   TSH 1.830 06/14/2022   Lab Results  Component Value Date   CHOL 260 (H) 06/14/2022   HDL 77 06/14/2022   LDLCALC 166 (H) 06/14/2022   TRIG 99 06/14/2022   CHOLHDL 3 04/03/2022   Lab Results  Component Value Date   VD25OH 30.2 06/14/2022   Lab Results  Component Value Date   WBC 5.7 04/03/2022   HGB 13.9 04/03/2022   HCT 42.9 04/03/2022    MCV 88.3 04/03/2022   PLT 290.0 04/03/2022   No results found for: "IRON", "TIBC", "FERRITIN"  Attestation Statements:   Reviewed by clinician on day of visit: allergies, medications, problem list, medical history, surgical history, family history, social history, and previous encounter notes.  Time spent on visit including pre-visit chart review and post-visit care and charting was 30 minutes.   Wilhemena Durie, am acting as transcriptionist for Thomes Dinning, MD.  I have reviewed the above documentation for accuracy and completeness, and I agree with the above. -Thomes Dinning, MD

## 2022-08-08 ENCOUNTER — Ambulatory Visit (INDEPENDENT_AMBULATORY_CARE_PROVIDER_SITE_OTHER): Payer: BC Managed Care – PPO | Admitting: Internal Medicine

## 2022-08-08 ENCOUNTER — Encounter (INDEPENDENT_AMBULATORY_CARE_PROVIDER_SITE_OTHER): Payer: Self-pay | Admitting: Internal Medicine

## 2022-08-08 VITALS — BP 120/77 | HR 78 | Temp 98.3°F | Ht 63.0 in | Wt 192.2 lb

## 2022-08-08 DIAGNOSIS — E669 Obesity, unspecified: Secondary | ICD-10-CM

## 2022-08-08 DIAGNOSIS — E7849 Other hyperlipidemia: Secondary | ICD-10-CM | POA: Diagnosis not present

## 2022-08-08 DIAGNOSIS — Z6834 Body mass index (BMI) 34.0-34.9, adult: Secondary | ICD-10-CM

## 2022-08-08 NOTE — Progress Notes (Signed)
Chief Complaint:   OBESITY Kimberly Richards is here to discuss her progress with her obesity treatment plan along with follow-up of her obesity related diagnoses. Kimberly Richards is on the Category 2 Plan and states she is following her eating plan approximately 50% of the time. Kimberly Richards states she is doing pilates for 50 minutes 4-5 times per week.  Today's visit was #: 3 Starting weight: 198 lbs Starting date: 06/13/2022 Today's weight: 192 lbs Today's date: 08/08/2022 Total lbs lost to date: 6 Total lbs lost since last in-office visit: 3  Interim History: Kimberly Richards presents today for follow-up.  Since last office visit she has lost 3 pounds.  She reports adherence to nutrition plan is fair due to the holidays.  She feels pretty confident about improving adherence.  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  She reports adequate satiety when following plan and denies abnormal cravings or eating patterns.  She denies consumption of liquid calories.  Her bioimpedance shows a decrease of 44% to 42%.  Muscle mass is preserved visceral fat rating has decreased from 11-10.  Subjective:   1. Other hyperlipidemia Recent LDL 160. May be due to spillover effects. I discussed labs with the patient today. The 10-year ASCVD risk score (Arnett DK, et al., 2019) is: 0.9%   Assessment/Plan:   1. Other hyperlipidemia Continue with weight loss therapy. Does not benefit from statin at present. Repeat FLP in 6 months.   2. Obesity BMI 34.0 Kimberly Richards is currently in the action stage of change. As such, her goal is to continue with weight loss efforts. She has agreed to the Category 2 Plan.   Exercise goals: As is.   Behavioral modification strategies: increasing water intake, no skipping meals, meal planning and cooking strategies, keeping healthy foods in the home, and better snacking choices.  Kimberly Richards has agreed to follow-up with our clinic in 3 to 4 weeks. She was informed of  the importance of frequent follow-up visits to maximize her success with intensive lifestyle modifications for her multiple health conditions.   Objective:   Blood pressure 120/77, pulse 78, temperature 98.3 F (36.8 C), height '5\' 3"'$  (1.6 m), weight 192 lb 3.2 oz (87.2 kg), SpO2 97 %. Body mass index is 34.05 kg/m.  General: Cooperative, alert, well developed, in no acute distress. HEENT: Conjunctivae and lids unremarkable. Cardiovascular: Regular rhythm.  Lungs: Normal work of breathing. Neurologic: No focal deficits.   Lab Results  Component Value Date   CREATININE 0.90 06/14/2022   BUN 16 06/14/2022   NA 140 06/14/2022   K 4.3 06/14/2022   CL 102 06/14/2022   CO2 22 06/14/2022   Lab Results  Component Value Date   ALT 22 06/14/2022   AST 22 06/14/2022   ALKPHOS 85 06/14/2022   BILITOT 0.3 06/14/2022   Lab Results  Component Value Date   HGBA1C 5.4 06/14/2022   HGBA1C 5.3 02/10/2016   Lab Results  Component Value Date   INSULIN 13.0 06/14/2022   Lab Results  Component Value Date   TSH 1.830 06/14/2022   Lab Results  Component Value Date   CHOL 260 (H) 06/14/2022   HDL 77 06/14/2022   LDLCALC 166 (H) 06/14/2022   TRIG 99 06/14/2022   CHOLHDL 3 04/03/2022   Lab Results  Component Value Date   VD25OH 30.2 06/14/2022   Lab Results  Component Value Date   WBC 5.7 04/03/2022   HGB 13.9 04/03/2022   HCT  42.9 04/03/2022   MCV 88.3 04/03/2022   PLT 290.0 04/03/2022   No results found for: "IRON", "TIBC", "FERRITIN"  Attestation Statements:   Reviewed by clinician on day of visit: allergies, medications, problem list, medical history, surgical history, family history, social history, and previous encounter notes.  Time spent on visit including pre-visit chart review and post-visit care and charting was 25 minutes.   Wilhemena Durie, am acting as transcriptionist for Thomes Dinning, MD.  I have reviewed the above documentation for accuracy and  completeness, and I agree with the above. -Thomes Dinning, MD

## 2022-09-05 ENCOUNTER — Encounter (INDEPENDENT_AMBULATORY_CARE_PROVIDER_SITE_OTHER): Payer: Self-pay | Admitting: Internal Medicine

## 2022-09-05 ENCOUNTER — Ambulatory Visit (INDEPENDENT_AMBULATORY_CARE_PROVIDER_SITE_OTHER): Payer: BC Managed Care – PPO | Admitting: Internal Medicine

## 2022-09-05 VITALS — BP 134/78 | HR 77 | Temp 98.3°F | Ht 63.0 in | Wt 191.0 lb

## 2022-09-05 DIAGNOSIS — Z6834 Body mass index (BMI) 34.0-34.9, adult: Secondary | ICD-10-CM | POA: Diagnosis not present

## 2022-09-05 DIAGNOSIS — E785 Hyperlipidemia, unspecified: Secondary | ICD-10-CM | POA: Diagnosis not present

## 2022-09-05 DIAGNOSIS — E669 Obesity, unspecified: Secondary | ICD-10-CM

## 2022-09-05 NOTE — Progress Notes (Signed)
Office: [phone]  /  Fax: [place] [facility] Height: '5\' 3"'$  (1.6 m) Weight: 191 lb (86.6 kg) Temp: 98.3 F (36.8 C) Pulse Rate: 77 BP: 134/78 SpO2: 97 % Fasting: No Labs: No Today's Visit #: 4 Weight at Last VIsit: 192 lb Weight Lost Since Last Visit: 1 lb  Body Fat %: 42.6 % Fat Mass (lbs): 81.6 lbs Muscle Mass (lbs): 104.4 lbs Total Body Water (lbs): 72.4 lbs Visceral Fat Rating : 10 Starting Date: 06/13/22 Starting Weight: 198 lb Total Weight Loss (lbs): 7 lb (3.175 kg)    HPI  Chief Complaint: OBESITY  Kimberly Richards is here to discuss her progress with her obesity treatment plan. She is on the the Category 2 Plan and states she is following her eating plan approximately 80 % of the time. She states she is exercising 50 minutes 5 times per week. She is currently doing pilates, treadmill and some weights.   Interval History: Since last office she has lost 1 pound.  She has been increasing physical activity and doing a combination of Pilates and weights.  She is also going to the gym with her husband.  Her 24-hour recall 2 eggs with [place] sausage and cheese without the bread.  For lunch she has [place] for lean protein with vegetables and 6 crackers.  At night family prefers mixed dishes but she focuses on getting protein and vegetables.  She has eliminated oils and added sugar.  She is avoiding processed foods and eating out.  She does acknowledge may be drinking more wine than recommended.  She recognizes this as a source of calories.  She reports adequate satiety and satiation.  She denies abnormal cravings or hunger signals.  Pharmacotherapy: None  PHYSICAL EXAM:  Blood pressure 134/78, pulse 77, temperature 98.3 F (36.8 C), height '5\' 3"'$  (1.6 m), weight 191 lb (86.6 kg), SpO2 97 %. Body mass index is 33.83 kg/m.  General: She is overweight, cooperative, alert, well developed, and in no acute distress. PSYCH: Has  normal mood, affect and thought process.   HEENT: EOMI, sclerae are anicteric. Lungs: Normal breathing effort, no conversational dyspnea. Extremities: No edema.  Neurologic: No gross sensory or motor deficits. No tremors or fasciculations noted.    DIAGNOSTIC DATA REVIEWED:  BMET    Component Value Date/Time   NA 140 06/14/2022 0838   K 4.3 06/14/2022 0838   CL 102 06/14/2022 0838   CO2 22 06/14/2022 0838   GLUCOSE 97 06/14/2022 0838   GLUCOSE 91 04/03/2022 1033   BUN 16 06/14/2022 0838   CREATININE 0.90 06/14/2022 0838   CREATININE 0.77 03/15/2018 1532   CALCIUM 11.8 (H) 06/14/2022 0838   Lab Results  Component Value Date   HGBA1C 5.4 06/14/2022   HGBA1C 5.3 02/10/2016   Lab Results  Component Value Date   INSULIN 13.0 06/14/2022   CBC    Component Value Date/Time   WBC 5.7 04/03/2022 1033   RBC 4.85 04/03/2022 1033   HGB 13.9 04/03/2022 1033   HCT 42.9 04/03/2022 1033   PLT 290.0 04/03/2022 1033   MCV 88.3 04/03/2022 1033   MCHC 32.5 04/03/2022 1033   RDW 14.1 04/03/2022 1033   Iron/TIBC/Ferritin/ %Sat No results found for: "IRON", "TIBC", "FERRITIN", "IRONPCTSAT" Lipid Panel     Component Value Date/Time   CHOL 260 (H) 06/14/2022 0838   TRIG 99 06/14/2022 0838   HDL 77 06/14/2022 0838   CHOLHDL 3 04/03/2022 1033   VLDL  21.8 04/03/2022 1033   LDLCALC 166 (H) 06/14/2022 0838   Hepatic Function Panel     Component Value Date/Time   PROT 8.0 06/14/2022 0838   ALBUMIN 4.8 06/14/2022 0838   AST 22 06/14/2022 0838   ALT 22 06/14/2022 0838   ALKPHOS 85 06/14/2022 0838   BILITOT 0.3 06/14/2022 0838      Component Value Date/Time   TSH 1.830 06/14/2022 0838     ASSESSMENT AND PLAN  TREATMENT PLAN FOR OBESITY:  Recommended Dietary Goals  Kimberly Richards is currently in the action stage of change. As such, her goal is to continue weight management plan. She has agreed to the Category 3 Plan.  Behavioral Intervention  We discussed the following  Behavioral Modification Strategies today: increasing water intake, decreasing alcohol intake, meal planning and cooking strategies, dealing with family or coworker sabotage, avoiding temptations, and planning for success.  Additional resources provided today: NA  Recommended Physical Activity Goals  Kimberly Richards has been advised to work up to 150 minutes of moderate intensity aerobic activity a week and strengthening exercises 2-3 times per week for cardiovascular health, weight loss maintenance and preservation of muscle mass.   She has agreed to continue physical activity as is.    Pharmacotherapy We discussed various medication options to help Kimberly Richards with her weight loss efforts and we both agreed to continue with nutrition and behavioral strategies.  ASSOCIATED CONDITIONS ADDRESSED TODAY  Hyperlipidemia, unspecified hyperlipidemia type Assessment & Plan: LDL is at goal. This is likely secondary to adiposity and spill over effect.  Recommended LDL goal is less <70 to reduce the risk of fatty streaks and progression to obstructive ASCVD. The 10-year ASCVD risk score (Arnett DK, et al., 2019) is: 1.4%  Lab Results  Component Value Date   CHOL 260 (H) 06/14/2022   HDL 77 06/14/2022   LDLCALC 166 (H) 06/14/2022   TRIG 99 06/14/2022   CHOLHDL 3 04/03/2022   Lab Results  Component Value Date   ALT 22 06/14/2022   AST 22 06/14/2022   ALKPHOS 85 06/14/2022   BILITOT 0.3 06/14/2022   We recommend continue to work on lifestyle changes, weight loss and reduction in saturated fats in her diet.  We will repeat cholesterol at 6 months        Obesity BMI 34.0      Return in about 2 weeks (around 09/19/2022) for For Weight Mangement with Dr. Gerarda Fraction.Marland Kitchen She was informed of the importance of frequent follow up visits to maximize her success with intensive lifestyle modifications for her multiple health conditions.   ATTESTASTION STATEMENTS:  Reviewed by clinician on day of visit:  allergies, medications, problem list, medical history, surgical history, family history, social history, and previous encounter notes.      Thomes Dinning, MD

## 2022-09-05 NOTE — Assessment & Plan Note (Signed)
LDL is at goal. This is likely secondary to adiposity and spill over effect.  Recommended LDL goal is less <70 to reduce the risk of fatty streaks and progression to obstructive ASCVD. The 10-year ASCVD risk score (Arnett DK, et al., 2019) is: 1.4%  Lab Results  Component Value Date   CHOL 260 (H) 06/14/2022   HDL 77 06/14/2022   LDLCALC 166 (H) 06/14/2022   TRIG 99 06/14/2022   CHOLHDL 3 04/03/2022   Lab Results  Component Value Date   ALT 22 06/14/2022   AST 22 06/14/2022   ALKPHOS 85 06/14/2022   BILITOT 0.3 06/14/2022   We recommend continue to work on lifestyle changes, weight loss and reduction in saturated fats in her diet.  We will repeat cholesterol at 6 months

## 2022-09-20 ENCOUNTER — Ambulatory Visit (INDEPENDENT_AMBULATORY_CARE_PROVIDER_SITE_OTHER): Payer: BC Managed Care – PPO | Admitting: Internal Medicine

## 2022-09-20 ENCOUNTER — Encounter (INDEPENDENT_AMBULATORY_CARE_PROVIDER_SITE_OTHER): Payer: Self-pay | Admitting: Internal Medicine

## 2022-09-20 VITALS — BP 125/82 | HR 62 | Temp 98.2°F | Ht 63.0 in | Wt 187.0 lb

## 2022-09-20 DIAGNOSIS — Z6834 Body mass index (BMI) 34.0-34.9, adult: Secondary | ICD-10-CM

## 2022-09-20 DIAGNOSIS — G4709 Other insomnia: Secondary | ICD-10-CM

## 2022-09-20 DIAGNOSIS — E669 Obesity, unspecified: Secondary | ICD-10-CM

## 2022-09-20 DIAGNOSIS — E88819 Insulin resistance, unspecified: Secondary | ICD-10-CM | POA: Diagnosis not present

## 2022-09-20 NOTE — Assessment & Plan Note (Signed)
She has been using valerian root.  We had talked about that reducing alcohol in the evening may improve the quality of her sleep.  She may also try magnesium glycinate in the evenings to facilitate sleep.  She will work on sleep hygiene.

## 2022-09-20 NOTE — Progress Notes (Signed)
Office: [phone]  /  Fax: [facility] Height: 5' 3"$  (1.6 m) Weight: 187 lb (84.8 kg) Temp: 98.2 F (36.8 C) Pulse Rate: 62 BP: 125/82 SpO2: 100 % Fasting: n Labs: n Today's Visit #: 5 Weight at Last VIsit: 191 lb Weight Lost Since Last Visit: 4 lb  Body Fat %: 41.7 % Fat Mass (lbs): 78.2 lbs Muscle Mass (lbs): 103.8 lbs Total Body Water (lbs): 71 lbs Visceral Fat Rating : 10 Starting Date: 06/13/22 Starting Weight: 187 lb Total Weight Loss (lbs): 11 lb (4.99 kg)    HPI  Chief Complaint: OBESITY  Kimberly Richards is here to discuss her progress with her obesity treatment plan. She is on the the Category 2 Plan and states she is following her eating plan approximately 80 % of the time. She states she is exercising 50 minutes 5 times per week.   Interval History:  Since last office visit she has lost 4 pounds.  Adherence to prescribed reduced calorie healthy diet is good.  She notes decreasing alcohol consumption in the evenings and feels this has resulted in additional weight loss.  She does have some problems with light sleep and has been taking valerian root. Denies problems with appetite and hunger signals.  Denies problems with satiety and satiation.  Denies problems with eating patterns and portion control.  Sleeping approximately 6 hours a day.  Sleep described as restorative.  Stress levels are reported as low and manageable.  Barriers identified none.    Pharmacotherapy: None  PHYSICAL EXAM:  Blood pressure 125/82, pulse 62, temperature 98.2 F (36.8 C), height 5' 3"$  (1.6 m), weight 187 lb (84.8 kg), SpO2 100 %. Body mass index is 33.13 kg/m.  General: She is overweight, cooperative, alert, well developed, and in no acute distress. PSYCH: Has normal mood, affect and thought process.   HEENT: EOMI, sclerae are anicteric. Lungs: Normal breathing effort, no conversational dyspnea. Extremities: No  edema.  Neurologic: No gross sensory or motor deficits. No tremors or fasciculations noted.    DIAGNOSTIC DATA REVIEWED:  BMET    Component Value Date/Time   NA 140 06/14/2022 0838   K 4.3 06/14/2022 0838   CL 102 06/14/2022 0838   CO2 22 06/14/2022 0838   GLUCOSE 97 06/14/2022 0838   GLUCOSE 91 04/03/2022 1033   BUN 16 06/14/2022 0838   CREATININE 0.90 06/14/2022 0838   CREATININE 0.77 03/15/2018 1532   CALCIUM 11.8 (H) 06/14/2022 0838   Lab Results  Component Value Date   HGBA1C 5.4 06/14/2022   HGBA1C 5.3 02/10/2016   Lab Results  Component Value Date   INSULIN 13.0 06/14/2022   Lab Results  Component Value Date   TSH 1.830 06/14/2022   CBC    Component Value Date/Time   WBC 5.7 04/03/2022 1033   RBC 4.85 04/03/2022 1033   HGB 13.9 04/03/2022 1033   HCT 42.9 04/03/2022 1033   PLT 290.0 04/03/2022 1033   MCV 88.3 04/03/2022 1033   MCHC 32.5 04/03/2022 1033   RDW 14.1 04/03/2022 1033   Iron Studies No results found for: "IRON", "TIBC", "FERRITIN", "IRONPCTSAT" Lipid Panel     Component Value Date/Time   CHOL 260 (H) 06/14/2022 0838   TRIG 99 06/14/2022 0838   HDL 77 06/14/2022 0838   CHOLHDL 3 04/03/2022 1033   VLDL 21.8 04/03/2022 1033   LDLCALC 166 (H) 06/14/2022 0838   Hepatic Function Panel     Component Value Date/Time  PROT 8.0 06/14/2022 0838   ALBUMIN 4.8 06/14/2022 0838   AST 22 06/14/2022 0838   ALT 22 06/14/2022 0838   ALKPHOS 85 06/14/2022 0838   BILITOT 0.3 06/14/2022 0838      Component Value Date/Time   TSH 1.830 06/14/2022 0838   Nutritional Lab Results  Component Value Date   VD25OH 30.2 06/14/2022     ASSESSMENT AND PLAN  TREATMENT PLAN FOR OBESITY:  Recommended Dietary Goals  Kimberly Richards is currently in the action stage of change. As such, her goal is to continue weight management plan. She has agreed to the Category 2 Plan.  Behavioral Intervention  We discussed the following Behavioral Modification Strategies  today: increasing lean protein intake, increasing vegetables, increase water intake, work on meal planning and easy cooking plans, think about ways to increase physical activity, and decrease ETOH.  Additional resources provided today: NA  Recommended Physical Activity Goals  Kimberly Richards has been advised to work up to 150 minutes of moderate intensity aerobic activity a week and strengthening exercises 2-3 times per week for cardiovascular health, weight loss maintenance and preservation of muscle mass.   She has agreed to continue physical activity as is.    Pharmacotherapy We discussed various medication options to help Kimberly Richards with her weight loss efforts and we both agreed to continue with nutritional and behavioral strategies.  ASSOCIATED CONDITIONS ADDRESSED TODAY  Obesity BMI 34.0  Insulin resistance Assessment & Plan: Her HOMA-IR is 3.1. Optimal level < 1.9. This is complex condition associated with genetics, ectopic fat and lifestyle factors. Insulin resistance may result in weight gain, abnormal cravings (particularly for carbs) and fatigue. This may result in additional weight gain and lead to pre-diabetes and diabetes if untreated.   Lab Results  Component Value Date   HGBA1C 5.4 06/14/2022   Lab Results  Component Value Date   INSULIN 13.0 06/14/2022   Lab Results  Component Value Date   GLUCOSE 97 06/14/2022   GLUCOSE 80 02/10/2016    I recommend ongoing weight loss therapy (10% of TBW), increasing physical activity, reducing processed, simple sugars and saturated fats in diet.  She may also be a candidate for incretin therapy in the future.    Other insomnia Assessment & Plan: She has been using valerian root.  We had talked about that reducing alcohol in the evening may improve the quality of her sleep.  She may also try magnesium glycinate in the evenings to facilitate sleep.  She will work on sleep hygiene.       Return in about 3 weeks (around  10/11/2022) for For Weight Mangement with Dr. Gerarda Fraction.Marland Kitchen She was informed of the importance of frequent follow up visits to maximize her success with intensive lifestyle modifications for her multiple health conditions.   ATTESTASTION STATEMENTS:  Reviewed by clinician on day of visit: allergies, medications, problem list, medical history, surgical history, family history, social history, and previous encounter notes.   Time spent on visit including pre-visit chart review and post-visit care and charting was 30 minutes.    Thomes Dinning, MD

## 2022-09-20 NOTE — Assessment & Plan Note (Signed)
Her HOMA-IR is 3.1. Optimal level < 1.9. This is complex condition associated with genetics, ectopic fat and lifestyle factors. Insulin resistance may result in weight gain, abnormal cravings (particularly for carbs) and fatigue. This may result in additional weight gain and lead to pre-diabetes and diabetes if untreated.   Lab Results  Component Value Date   HGBA1C 5.4 06/14/2022   Lab Results  Component Value Date   INSULIN 13.0 06/14/2022   Lab Results  Component Value Date   GLUCOSE 97 06/14/2022   GLUCOSE 80 02/10/2016    I recommend ongoing weight loss therapy (10% of TBW), increasing physical activity, reducing processed, simple sugars and saturated fats in diet.  She may also be a candidate for incretin therapy in the future.

## 2022-10-02 ENCOUNTER — Ambulatory Visit: Payer: BC Managed Care – PPO | Admitting: Family Medicine

## 2022-10-04 ENCOUNTER — Encounter: Payer: Self-pay | Admitting: Family Medicine

## 2022-10-04 ENCOUNTER — Ambulatory Visit (INDEPENDENT_AMBULATORY_CARE_PROVIDER_SITE_OTHER): Payer: BC Managed Care – PPO | Admitting: Family Medicine

## 2022-10-04 VITALS — BP 120/70 | HR 96 | Temp 98.1°F | Ht 64.0 in | Wt 186.0 lb

## 2022-10-04 DIAGNOSIS — F339 Major depressive disorder, recurrent, unspecified: Secondary | ICD-10-CM

## 2022-10-04 DIAGNOSIS — L709 Acne, unspecified: Secondary | ICD-10-CM | POA: Diagnosis not present

## 2022-10-04 DIAGNOSIS — F411 Generalized anxiety disorder: Secondary | ICD-10-CM

## 2022-10-04 MED ORDER — AZELAIC ACID 15 % EX GEL: CUTANEOUS | 1 refills | Status: DC

## 2022-10-04 MED ORDER — TRETINOIN 0.025 % EX CREA: TOPICAL_CREAM | Freq: Every day | CUTANEOUS | 2 refills | Status: AC

## 2022-10-04 NOTE — Progress Notes (Signed)
Chief Complaint  Patient presents with   Follow-up    6 month     Subjective Kimberly Richards presents for f/u anxiety/depression.  Pt is currently being treated with Prozac 10 mg daily, Klonopin 0.5 mg twice daily as needed.  Reports doing well since treatment. No thoughts of harming self or others. No self-medication with alcohol, prescription drugs or illicit drugs. Pt is not following with a counselor/psychologist.  Cystic acne Patient has a history of acne for which she takes Aldactone 50 mg daily, tretinoin cream [id number] nightly application, and azelaic acid twice daily.  She reports compliance, some burning with the topicals.  She is content with her current skin situation.  She did see dermatology and we are taking over these prescriptions.  Past Medical History:  Diagnosis Date   Abnormal Pap smear of cervix    Acne    On Aldactone   Anemia    Anxiety with depression 12/18/2017   Cancer ([place])    Constipation    H/O ovarian cancer    High cholesterol    Hot flashes    HPV (human papilloma virus) anogenital infection    Other hyperlipidemia 07/03/2022   Ovarian cyst    Primary hyperparathyroidism ([place])    Allergies as of 10/04/2022       Reactions   Stadol [butorphanol]    Sulfa Antibiotics    Other reaction(s): gi distress        Medication List        Accurate as of October 04, 2022 12:00 PM. If you have any questions, ask your nurse or doctor.          Azelaic Acid 15 % gel After skin is thoroughly washed and patted dry, gently but thoroughly massage a thin film of azelaic acid cream into the affected area twice daily, in the morning and evening. What changed:  how to take this when to take this Changed by: Shelda Pal, DO   clonazePAM 0.5 MG tablet Commonly known as: KLONOPIN Take 0.5-1 tablets (0.25-0.5 mg total) by mouth 2 (two) times daily as needed for anxiety.   DIGESTIVE ENZYME PO Take by mouth daily at 12 noon.    FLUoxetine 10 MG tablet Commonly known as: PROZAC Take 1 tablet (10 mg total) by mouth daily.   fluticasone 50 MCG/ACT nasal spray Commonly known as: FLONASE Place 2 sprays into both nostrils daily.   FORTIFY PROBIOTIC WOMENS EX ST PO Take 1 tablet by mouth daily.   HM VITAMIN B COMPLEX/VITAMIN C PO Take 1 tablet by mouth 2 (two) times daily.   MELATONIN PO Take by mouth at bedtime. Free Sleep Aid   multivitamin tablet Take 1 tablet by mouth 2 (two) times daily.   NON FORMULARY 2 (two) times daily. Vegan Omega 3   spironolactone 50 MG tablet Commonly known as: ALDACTONE Take 1 tablet (50 mg total) by mouth daily.   tretinoin 0.025 % cream Commonly known as: RETIN-A Apply topically at bedtime.   VALERIAN PO Take by mouth.   vitamin E 180 MG (400 UNITS) capsule Generic drug: vitamin E Take 400 Units by mouth daily.        Exam BP 120/70 (BP Location: Left Arm, Patient Position: Sitting, Cuff Size: Normal)   Pulse 96   Temp 98.1 F (36.7 C) (Oral)   Ht '5\' 4"'$  (1.626 m)   Wt 186 lb (84.4 kg)   BMI 31.93 kg/m  General:  well developed, well nourished, in no apparent  distress Lungs: Clear to auscultation bilaterally.  No respiratory distress Skin: Open close comedones noted over the lateral angles of the mouth bilaterally, no erythema Psych: well oriented with normal range of affect and age-appropriate judgement/insight, alert and oriented x4.  Assessment and Plan  GAD (generalized anxiety disorder)  Depression, recurrent (HCC)  Acne, unspecified acne type - Plan: tretinoin (RETIN-A) 0.025 % cream, Azelaic Acid 15 % gel  1/2.  Chronic, stable.  Continue Prozac 10 mg daily, Klonopin 0.5 mg twice daily as needed. 3.  Chronic, stable.  Continue Aldactone 50 mg daily, tretinoin 0.025% nightly, azelaic acid 15% gel twice daily application. F/u in 6 months. The patient voiced understanding and agreement to the plan.  Ronceverte,  DO 10/04/22 12:00 PM

## 2022-10-04 NOTE — Patient Instructions (Signed)
Keep the diet clean and stay active.  Please consider counseling. Contact [phone] to schedule an appointment or inquire about cost/insurance coverage.  [facility] located at [place], [place], [place].  Phone number = [phone].  Dr. Lennice Sites - Adult Psychiatry.    [facility] located at [place], [place], [place]. Phone number = [phone].   [facility] located at [address], [place], [place].  Phone number = [phone].   [facility] located at [place], [place], [place].  Phone number = [phone].   Let us know if you need anything.

## 2022-10-11 ENCOUNTER — Encounter: Payer: Self-pay | Admitting: Family Medicine

## 2022-10-12 ENCOUNTER — Ambulatory Visit (INDEPENDENT_AMBULATORY_CARE_PROVIDER_SITE_OTHER): Payer: BC Managed Care – PPO | Admitting: Internal Medicine

## 2022-10-12 ENCOUNTER — Other Ambulatory Visit: Payer: Self-pay | Admitting: Family Medicine

## 2022-10-12 ENCOUNTER — Encounter (INDEPENDENT_AMBULATORY_CARE_PROVIDER_SITE_OTHER): Payer: Self-pay | Admitting: Internal Medicine

## 2022-10-12 VITALS — BP 130/77 | HR 82 | Temp 98.0°F | Ht 64.0 in | Wt 182.0 lb

## 2022-10-12 DIAGNOSIS — F418 Other specified anxiety disorders: Secondary | ICD-10-CM

## 2022-10-12 DIAGNOSIS — E669 Obesity, unspecified: Secondary | ICD-10-CM

## 2022-10-12 DIAGNOSIS — Z1231 Encounter for screening mammogram for malignant neoplasm of breast: Secondary | ICD-10-CM

## 2022-10-12 DIAGNOSIS — Z6831 Body mass index (BMI) 31.0-31.9, adult: Secondary | ICD-10-CM

## 2022-10-12 DIAGNOSIS — E7800 Pure hypercholesterolemia, unspecified: Secondary | ICD-10-CM

## 2022-10-12 DIAGNOSIS — E88819 Insulin resistance, unspecified: Secondary | ICD-10-CM | POA: Diagnosis not present

## 2022-10-12 DIAGNOSIS — G4709 Other insomnia: Secondary | ICD-10-CM

## 2022-10-12 MED ORDER — CLONAZEPAM 0.5 MG PO TABS: 0.2500 mg | ORAL_TABLET | Freq: Two times a day (BID) | ORAL | 1 refills | Status: DC | PRN

## 2022-10-12 NOTE — Assessment & Plan Note (Signed)
Her HOMA-IR is 3.1. Optimal level < 1.9. This is complex condition associated with genetics, ectopic fat and lifestyle factors. Insulin resistance may result in weight gain, abnormal cravings (particularly for carbs) and fatigue. This may result in additional weight gain and lead to pre-diabetes and diabetes if untreated.   Lab Results  Component Value Date   HGBA1C 5.4 06/14/2022   Lab Results  Component Value Date   INSULIN 13.0 06/14/2022   Lab Results  Component Value Date   GLUCOSE 97 06/14/2022   GLUCOSE 80 02/10/2016    She has lost close to 10% of her body weight and will continue with nutritional and behavioral strategies for weight loss.  We will consider repeating serologies in May.  She is also a good candidate for incretin therapy and we would consider adding metformin in the future if needed.

## 2022-10-12 NOTE — Assessment & Plan Note (Signed)
LDL is not at goal. Elevated LDL may be secondary to nutrition, genetics and spillover effect from excess adiposity. Recommended LDL goal is <70 to reduce the risk of fatty streaks and the progression to obstructive ASCVD in the future. Her 10 year risk is: The 10-year ASCVD risk score (Arnett DK, et al., 2019) is: 1.2%  Lab Results  Component Value Date   CHOL 260 (H) 06/14/2022   HDL 77 06/14/2022   LDLCALC 166 (H) 06/14/2022   TRIG 99 06/14/2022   CHOLHDL 3 04/03/2022   Continue weight loss therapy, losing 10% or more of body weight may improve condition. Also advised to reduce saturated fats in diet to less than 10% of daily calories.  We will check fasting lipid panel in May.  She has lost close to 10% of body weight.

## 2022-10-12 NOTE — Assessment & Plan Note (Signed)
Improved.  She has reduced consumption of alcohol in the evenings.  She will be trying magnesium glycinate to facilitate sleep onset.  Continue to work on sleep hygiene

## 2022-10-12 NOTE — Progress Notes (Signed)
Office: [phone]  /  Fax: [phone]  WEIGHT SUMMARY AND BIOMETRICS  Vitals Temp: 98 F (36.7 C) BP: 130/77 Pulse Rate: 82 SpO2: 100 %   Anthropometric Measurements Height: '5\' 4"'$  (1.626 m) Weight: 182 lb (82.6 kg) BMI (Calculated): 31.22 Weight at Last Visit: 187 lb Weight Lost Since Last Visit: 5 lb Starting Weight: 187 lb Total Weight Loss (lbs): 16 lb (7.258 kg) Peak Weight: 206 lb   Body Composition  Body Fat %: 39.4 % Fat Mass (lbs): 71.8 lbs Muscle Mass (lbs): 104.6 lbs Total Body Water (lbs): 68.6 lbs Visceral Fat Rating : 9    HPI  Chief Complaint: OBESITY  Kimberly Richards is here to discuss her progress with her obesity treatment plan. She is on the the Category 2 Plan and states she is following her eating plan approximately 80 % of the time. She states she is exercising 50 minutes 5 times per week.    Interval History:  Since last office visit she has 5 lbs. BIA shows a decrease in body fat from 44% to 39%.  Her visceral fat rating has also improved.  Muscle mass has increased. She reports good adherence to reduced calorie nutritional plan. She has been working on has reduced liquid calories and measuring protein, not skipping meals. Also sleeping better and feeling better. Does not feel deprived. Making healthy choices. '[x]'$ Denies '[]'$ Reports problems with appetite and hunger signals.  '[x]'$ Denies '[]'$ Reports problems with satiety and satiation.  '[x]'$ Denies '[]'$ Reports problems with eating patterns and portion control.  '[x]'$ Denies '[]'$ Reports abnormal cravings Sleeping approximately 7 hours a day.  Sleep described as: '[x]'$ Restorative '[]'$ Unrestorative '[]'$ Interrupted Barriers identified none.   Pharmacotherapy for weight loss: She is currently taking no anti-obesity medication.   Weight promoting medications identified: None.  ASSESSMENT AND PLAN  TREATMENT PLAN FOR OBESITY:  Recommended Dietary Goals  Rayleigh is currently in the action stage of change. As  such, her goal is to continue weight management plan. She has agreed to: the Category 2 Plan.  Behavioral Intervention  We discussed the following Behavioral Modification Strategies today: increasing lean protein intake, increasing vegetables, increasing fiber rich foods, increasing water intake, work on meal planning and easy cooking plans, reading food labels , and work on scanning foods using [employer]UK[employer] app to quickly assess food quality.  Additional resources provided today: None  Recommended Physical Activity Goals  Nohemy has been advised to work up to 150 minutes of moderate intensity aerobic activity a week and strengthening exercises 2-3 times per week for cardiovascular health, weight loss maintenance and preservation of muscle mass.   She has agreed to :  '[x]'$  Continue current level of physical activity  '[]'$  Think about ways to increase physical activity '[]'$  Start strengthening exercises with a goal of 2-3 sessions a week  '[]'$  Start aerobic activity with a goal of 150 minutes a week at moderate intensity.  '[]'$  Increase the intensity, frequency or duration of strengthening exercises  '[]'$  Increase the intensity, frequency or duration of aerobic exercises   '[]'$  Increase physical activity in their day and reduce sedentary time (increase NEAT). '[]'$  Work on scheduling and tracking physical activity.   Pharmacotherapy We discussed various medication options to help Ammarie with her weight loss efforts and we both agreed to : continue with nutritional and behavioral strategies  ASSOCIATED CONDITIONS ADDRESSED TODAY  Insulin resistance Assessment & Plan: Her HOMA-IR is 3.1. Optimal level < 1.9. This is complex condition associated with genetics, ectopic fat and lifestyle factors. Insulin resistance may result  in weight gain, abnormal cravings (particularly for carbs) and fatigue. This may result in additional weight gain and lead to pre-diabetes and diabetes if untreated.   Lab Results   Component Value Date   HGBA1C 5.4 06/14/2022   Lab Results  Component Value Date   INSULIN 13.0 06/14/2022   Lab Results  Component Value Date   GLUCOSE 97 06/14/2022   GLUCOSE 80 02/10/2016    She has lost close to 10% of her body weight and will continue with nutritional and behavioral strategies for weight loss.  We will consider repeating serologies in May.  She is also a good candidate for incretin therapy and we would consider adding metformin in the future if needed.    Obesity BMI 34.0  Class 1 obesity with current BMI of 31  Other insomnia Assessment & Plan: Improved.  She has reduced consumption of alcohol in the evenings.  She will be trying magnesium glycinate to facilitate sleep onset.  Continue to work on sleep hygiene   Pure hypercholesterolemia Assessment & Plan: LDL is not at goal. Elevated LDL may be secondary to nutrition, genetics and spillover effect from excess adiposity. Recommended LDL goal is <70 to reduce the risk of fatty streaks and the progression to obstructive ASCVD in the future. Her 10 year risk is: The 10-year ASCVD risk score (Arnett DK, et al., 2019) is: 1.2%  Lab Results  Component Value Date   CHOL 260 (H) 06/14/2022   HDL 77 06/14/2022   LDLCALC 166 (H) 06/14/2022   TRIG 99 06/14/2022   CHOLHDL 3 04/03/2022   Continue weight loss therapy, losing 10% or more of body weight may improve condition. Also advised to reduce saturated fats in diet to less than 10% of daily calories.  We will check fasting lipid panel in May.  She has lost close to 10% of body weight.         PHYSICAL EXAM:  Blood pressure 130/77, pulse 82, temperature 98 F (36.7 C), height '5\' 4"'$  (1.626 m), weight 182 lb (82.6 kg), SpO2 100 %. Body mass index is 31.24 kg/m.  General: She is overweight, cooperative, alert, well developed, and in no acute distress. PSYCH: Has normal mood, affect and thought process.   HEENT: EOMI, sclerae are anicteric. Lungs:  Normal breathing effort, no conversational dyspnea. Extremities: No edema.  Neurologic: No gross sensory or motor deficits. No tremors or fasciculations noted.    DIAGNOSTIC DATA REVIEWED:  BMET    Component Value Date/Time   NA 140 06/14/2022 0838   K 4.3 06/14/2022 0838   CL 102 06/14/2022 0838   CO2 22 06/14/2022 0838   GLUCOSE 97 06/14/2022 0838   GLUCOSE 91 04/03/2022 1033   BUN 16 06/14/2022 0838   CREATININE 0.90 06/14/2022 0838   CREATININE 0.77 03/15/2018 1532   CALCIUM 11.8 (H) 06/14/2022 0838   Lab Results  Component Value Date   HGBA1C 5.4 06/14/2022   HGBA1C 5.3 02/10/2016   Lab Results  Component Value Date   INSULIN 13.0 06/14/2022   Lab Results  Component Value Date   TSH 1.830 06/14/2022   CBC    Component Value Date/Time   WBC 5.7 04/03/2022 1033   RBC 4.85 04/03/2022 1033   HGB 13.9 04/03/2022 1033   HCT 42.9 04/03/2022 1033   PLT 290.0 04/03/2022 1033   MCV 88.3 04/03/2022 1033   MCHC 32.5 04/03/2022 1033   RDW 14.1 04/03/2022 1033   Iron Studies No results found for: "IRON", "TIBC", "  FERRITIN", "IRONPCTSAT" Lipid Panel     Component Value Date/Time   CHOL 260 (H) 06/14/2022 0838   TRIG 99 06/14/2022 0838   HDL 77 06/14/2022 0838   CHOLHDL 3 04/03/2022 1033   VLDL 21.8 04/03/2022 1033   LDLCALC 166 (H) 06/14/2022 0838   Hepatic Function Panel     Component Value Date/Time   PROT 8.0 06/14/2022 0838   ALBUMIN 4.8 06/14/2022 0838   AST 22 06/14/2022 0838   ALT 22 06/14/2022 0838   ALKPHOS 85 06/14/2022 0838   BILITOT 0.3 06/14/2022 0838      Component Value Date/Time   TSH 1.830 06/14/2022 0838   Nutritional Lab Results  Component Value Date   VD25OH 30.2 06/14/2022     Return in about 3 weeks (around 11/02/2022) for For Weight Mangement with Dr. Gerarda Fraction.Marland Kitchen She was informed of the importance of frequent follow up visits to maximize her success with intensive lifestyle modifications for her multiple health  conditions.   ATTESTASTION STATEMENTS:  Reviewed by clinician on day of visit: allergies, medications, problem list, medical history, surgical history, family history, social history, and previous encounter notes.     Thomes Dinning, MD

## 2022-10-18 NOTE — Progress Notes (Signed)
This encounter was created in error - please disregard.

## 2022-11-14 ENCOUNTER — Encounter (HOSPITAL_BASED_OUTPATIENT_CLINIC_OR_DEPARTMENT_OTHER): Payer: Self-pay

## 2022-11-14 ENCOUNTER — Ambulatory Visit (HOSPITAL_BASED_OUTPATIENT_CLINIC_OR_DEPARTMENT_OTHER)
Admission: RE | Admit: 2022-11-14 | Discharge: 2022-11-14 | Disposition: A | Discharge status: Home / Self Care | Payer: BC Managed Care – PPO | Source: Ambulatory Visit | Attending: Family Medicine | Admitting: Family Medicine

## 2022-11-14 DIAGNOSIS — Z1231 Encounter for screening mammogram for malignant neoplasm of breast: Secondary | ICD-10-CM | POA: Diagnosis present

## 2022-11-16 ENCOUNTER — Other Ambulatory Visit: Payer: Self-pay | Admitting: Family Medicine

## 2022-11-16 DIAGNOSIS — R928 Other abnormal and inconclusive findings on diagnostic imaging of breast: Secondary | ICD-10-CM

## 2022-11-22 ENCOUNTER — Ambulatory Visit
Admission: RE | Admit: 2022-11-22 | Discharge: 2022-11-22 | Disposition: A | Discharge status: Home / Self Care | Payer: BC Managed Care – PPO | Source: Ambulatory Visit | Attending: Family Medicine | Admitting: Family Medicine

## 2022-11-22 DIAGNOSIS — R928 Other abnormal and inconclusive findings on diagnostic imaging of breast: Secondary | ICD-10-CM

## 2022-11-23 ENCOUNTER — Ambulatory Visit (INDEPENDENT_AMBULATORY_CARE_PROVIDER_SITE_OTHER): Payer: BC Managed Care – PPO | Admitting: Internal Medicine

## 2022-11-23 ENCOUNTER — Encounter (INDEPENDENT_AMBULATORY_CARE_PROVIDER_SITE_OTHER): Payer: Self-pay | Admitting: Internal Medicine

## 2022-11-23 VITALS — BP 104/72 | HR 87 | Temp 98.7°F | Ht 64.0 in | Wt 181.0 lb

## 2022-11-23 DIAGNOSIS — E669 Obesity, unspecified: Secondary | ICD-10-CM | POA: Diagnosis not present

## 2022-11-23 DIAGNOSIS — Z6831 Body mass index (BMI) 31.0-31.9, adult: Secondary | ICD-10-CM

## 2022-11-23 DIAGNOSIS — E88819 Insulin resistance, unspecified: Secondary | ICD-10-CM

## 2022-11-23 NOTE — Progress Notes (Signed)
Office: [phone]  /  Fax: [phone]  WEIGHT SUMMARY AND BIOMETRICS  Vitals Temp: 98.7 F (37.1 C) BP: 104/72 Pulse Rate: 87 SpO2: 97 %   Anthropometric Measurements Height:  (1.626 m) Weight: 181 lb (82.1 kg) BMI (Calculated): 31.05 Weight at Last Visit: 182 lb Weight Lost Since Last Visit: 1 lb Starting Weight: 187 lb Total Weight Loss (lbs): 17 lb (7.711 kg) Peak Weight: 206 lb   Body Composition  Body Fat %: 39.2 % Fat Mass (lbs): 71.2 lbs Muscle Mass (lbs): 105 lbs Total Body Water (lbs): 68.6 lbs Visceral Fat Rating : 9    No data recorded Today's Visit #: 7  Starting Date: 06/23/22   HPI  Chief Complaint: OBESITY  Kimberly Richards is here to discuss her progress with her obesity treatment plan. She is on the the Category 2 Plan and states she is following her eating plan approximately 70 % of the time. She states she is exercising 50 minutes 5 times per week.  Interval History:  Since last office visit she has lost 1 lbs.  She has lost 22 pounds so far. She reports good adherence to reduced calorie nutritional plan.  She recent name went on vacation and managed to lose 1 pound so she is very proud of that. She has been working on reading food labels, not skipping meals, increasing protein intake at every meal, eating more fruits, eating more vegetables, making healthier choices, begun to exercise, and mindfulness around eating Reports problems with appetite and hunger signals.  Denies problems with satiety and satiation.  Denies problems with eating patterns and portion control.  Denies abnormal cravings. Denies feeling deprived or restricted.   Barriers identified: none.   Pharmacotherapy for weight loss: She is currently taking no anti-obesity medication.    ASSESSMENT AND PLAN  TREATMENT PLAN FOR OBESITY:  Recommended Dietary Goals  Kimberly Richards is currently in the action stage of change. As such, her goal is to continue weight management  plan. She has agreed to: continue current plan  Behavioral Intervention  We discussed the following Behavioral Modification Strategies today: increasing lean protein intake, decreasing simple carbohydrates , increasing vegetables, increasing lower glycemic fruits, increasing water intake, continue to practice mindfulness when eating, and planning for success.  Additional resources provided today: None  Recommended Physical Activity Goals  Kimberly Richards has been advised to work up to 150 minutes of moderate intensity aerobic activity a week and strengthening exercises 2-3 times per week for cardiovascular health, weight loss maintenance and preservation of muscle mass.   She has agreed to :  Think about ways to increase physical activity  Pharmacotherapy We discussed various medication options to help Kimberly Richards with her weight loss efforts and we both agreed to : continue with nutritional and behavioral strategies  ASSOCIATED CONDITIONS ADDRESSED TODAY  Class 1 obesity with serious comorbidity and body mass index (BMI) of 31.0 to 31.9 in adult, unspecified obesity type Assessment & Plan: Thus far she has lost 22 pounds or 11% of baseline body weight which is really impressive.  She has declined pharmacotherapy and will continue with nutritional and behavioral strategies.   Insulin resistance Assessment & Plan: Her HOMA-IR is 3.1. Optimal level < 1.9. This is complex condition associated with genetics, ectopic fat and lifestyle factors. Insulin resistance may result in weight gain, abnormal cravings (particularly for carbs) and fatigue. This may result in additional weight gain and lead to pre-diabetes and diabetes if untreated.   Lab Results  Component Value Date  HGBA1C 5.4 06/14/2022   Lab Results  Component Value Date   INSULIN 13.0 06/14/2022   Lab Results  Component Value Date   GLUCOSE 97 06/14/2022   GLUCOSE 80 02/10/2016    She has lost close to 10% of her body weight  and will continue with nutritional and behavioral strategies for weight loss.  We will consider repeating serologies in May.  She is also a good candidate for pharmacoprophylaxis but declines at present time.       PHYSICAL EXAM:  Blood pressure 104/72, pulse 87, temperature 98.7 F (37.1 C), height  (1.626 m), weight 181 lb (82.1 kg), SpO2 97 %. Body mass index is 31.07 kg/m.  General: She is overweight, cooperative, alert, well developed, and in no acute distress. PSYCH: Has normal mood, affect and thought process.   HEENT: EOMI, sclerae are anicteric. Lungs: Normal breathing effort, no conversational dyspnea. Extremities: No edema.  Neurologic: No gross sensory or motor deficits. No tremors or fasciculations noted.    DIAGNOSTIC DATA REVIEWED:  BMET    Component Value Date/Time   NA 140 06/14/2022 0838   K 4.3 06/14/2022 0838   CL 102 06/14/2022 0838   CO2 22 06/14/2022 0838   GLUCOSE 97 06/14/2022 0838   GLUCOSE 91 04/03/2022 1033   BUN 16 06/14/2022 0838   CREATININE 0.90 06/14/2022 0838   CREATININE 0.77 03/15/2018 1532   CALCIUM 11.8 (H) 06/14/2022 0838   Lab Results  Component Value Date   HGBA1C 5.4 06/14/2022   HGBA1C 5.3 02/10/2016   Lab Results  Component Value Date   INSULIN 13.0 06/14/2022   Lab Results  Component Value Date   TSH 1.830 06/14/2022   CBC    Component Value Date/Time   WBC 5.7 04/03/2022 1033   RBC 4.85 04/03/2022 1033   HGB 13.9 04/03/2022 1033   HCT 42.9 04/03/2022 1033   PLT 290.0 04/03/2022 1033   MCV 88.3 04/03/2022 1033   MCHC 32.5 04/03/2022 1033   RDW 14.1 04/03/2022 1033   Iron Studies No results found for: "IRON", "TIBC", "FERRITIN", "IRONPCTSAT" Lipid Panel     Component Value Date/Time   CHOL 260 (H) 06/14/2022 0838   TRIG 99 06/14/2022 0838   HDL 77 06/14/2022 0838   CHOLHDL 3 04/03/2022 1033   VLDL 21.8 04/03/2022 1033   LDLCALC 166 (H) 06/14/2022 0838   Hepatic Function Panel     Component  Value Date/Time   PROT 8.0 06/14/2022 0838   ALBUMIN 4.8 06/14/2022 0838   AST 22 06/14/2022 0838   ALT 22 06/14/2022 0838   ALKPHOS 85 06/14/2022 0838   BILITOT 0.3 06/14/2022 0838      Component Value Date/Time   TSH 1.830 06/14/2022 0838   Nutritional Lab Results  Component Value Date   VD25OH 30.2 06/14/2022     Return in about 3 weeks (around 12/14/2022) for For Weight Mangement with Dr. Rikki Spearing.Marland Kitchen She was informed of the importance of frequent follow up visits to maximize her success with intensive lifestyle modifications for her multiple health conditions.   ATTESTASTION STATEMENTS:  Reviewed by clinician on day of visit: allergies, medications, problem list, medical history, surgical history, family history, social history, and previous encounter notes.     Worthy Rancher, MD

## 2022-11-23 NOTE — Assessment & Plan Note (Signed)
Her HOMA-IR is 3.1. Optimal level < 1.9. This is complex condition associated with genetics, ectopic fat and lifestyle factors. Insulin resistance may result in weight gain, abnormal cravings (particularly for carbs) and fatigue. This may result in additional weight gain and lead to pre-diabetes and diabetes if untreated.   Lab Results  Component Value Date   HGBA1C 5.4 06/14/2022   Lab Results  Component Value Date   INSULIN 13.0 06/14/2022   Lab Results  Component Value Date   GLUCOSE 97 06/14/2022   GLUCOSE 80 02/10/2016    She has lost close to 10% of her body weight and will continue with nutritional and behavioral strategies for weight loss.  We will consider repeating serologies in May.  She is also a good candidate for pharmacoprophylaxis but declines at present time.

## 2022-11-23 NOTE — Assessment & Plan Note (Signed)
Thus far she has lost 22 pounds or 11% of baseline body weight which is really impressive.  She has declined pharmacotherapy and will continue with nutritional and behavioral strategies.

## 2022-11-28 ENCOUNTER — Other Ambulatory Visit: Payer: Self-pay | Admitting: Family Medicine

## 2022-11-28 DIAGNOSIS — N6310 Unspecified lump in the right breast, unspecified quadrant: Secondary | ICD-10-CM

## 2022-11-28 DIAGNOSIS — N6320 Unspecified lump in the left breast, unspecified quadrant: Secondary | ICD-10-CM

## 2022-12-28 ENCOUNTER — Other Ambulatory Visit: Payer: Self-pay | Admitting: Family Medicine

## 2022-12-28 DIAGNOSIS — L709 Acne, unspecified: Secondary | ICD-10-CM

## 2022-12-28 DIAGNOSIS — F418 Other specified anxiety disorders: Secondary | ICD-10-CM

## 2023-02-09 ENCOUNTER — Other Ambulatory Visit: Payer: Self-pay | Admitting: Family Medicine

## 2023-02-09 DIAGNOSIS — L709 Acne, unspecified: Secondary | ICD-10-CM

## 2023-03-10 ENCOUNTER — Ambulatory Visit
Admission: RE | Admit: 2023-03-10 | Discharge: 2023-03-10 | Disposition: A | Discharge status: Home / Self Care | Payer: BC Managed Care – PPO | Source: Ambulatory Visit

## 2023-03-10 VITALS — BP 124/84 | HR 88 | Temp 97.8°F | Resp 17

## 2023-03-10 DIAGNOSIS — H60391 Other infective otitis externa, right ear: Secondary | ICD-10-CM | POA: Diagnosis not present

## 2023-03-10 MED ORDER — NEOMYCIN-POLYMYXIN-HC 3.5-10000-1 OT SUSP: 3.0000 [drp] | Freq: Three times a day (TID) | OTIC | 0 refills | Status: DC

## 2023-03-10 NOTE — ED Triage Notes (Signed)
Pt c/o RT ear pain since Wed this past week. Pain sometimes sharp. Tylenol and ibuprofen prn.

## 2023-03-10 NOTE — ED Provider Notes (Signed)
[facility]    CSN: [id number] Arrival date & time: 03/10/23  1318      History   Chief Complaint Chief Complaint  Patient presents with   Otalgia    RT    HPI Kimberly Richards is a 47 y.o. female.   HPI  Ear pain for a couple of days Not prone to ear problems No nasal or sinus congestion Hearing is normal No swimming  Past Medical History:  Diagnosis Date   Abnormal Pap smear of cervix    Acne    On Aldactone   Anemia    Anxiety with depression 12/18/2017   Cancer (HCC)    Constipation    H/O ovarian cancer    High cholesterol    Hot flashes    HPV (human papilloma virus) anogenital infection    Other hyperlipidemia 07/03/2022   Ovarian cyst    Primary hyperparathyroidism [facility])     Patient Active Problem List   Diagnosis Date Noted   GAD (generalized anxiety disorder) 10/04/2022   Insulin resistance 09/20/2022   Other insomnia 09/20/2022   Hypercalcemia 07/03/2022   Hyperlipidemia 07/03/2022   Class 1 obesity with serious comorbidity and body mass index (BMI) of 31.0 to 31.9 in adult 07/03/2022   Vitamin D deficiency 07/03/2022   History of ovarian cancer in adulthood 04/23/2020   Primary hyperparathyroidism (HCC)    Anxiety with depression 12/18/2017   Acne 12/18/2017   Poor venous access 03/21/2016    Past Surgical History:  Procedure Laterality Date   ABDOMINAL HYSTERECTOMY     BREAST BIOPSY Right    CESAREAN SECTION     x3   LAPAROSCOPIC HYSTERECTOMY     total   LAPAROTOMY     Ovarian mass removed  12/06/2011   Borderline Ovarian Cancer   SMALL INTESTINE SURGERY      OB History     Gravida  3   Para  3   Term  3   Preterm      AB      Living  3      SAB      IAB      Ectopic      Multiple      Live Births               Home Medications    Prior to Admission medications   Medication Sig Start Date End Date Taking? Authorizing Provider  neomycin-polymyxin-hydrocortisone (CORTISPORIN)  3.5-10000-1 OTIC suspension Place 3 drops into the right ear 3 (three) times daily. 03/10/23  Yes Eustace Moore, MD  Azelaic Acid 15 % gel APPLY TOPICALLY TO AFFECTED AREA TWICE DAILY IN THE MORNING AND EVENING AFTER SKIN HAS BEEN THOROUGHLY WASHED AND PATTED DRY 02/09/23   Wendling, Jilda Roche, DO  B Complex-C-Folic Acid (HM VITAMIN B COMPLEX/VITAMIN C PO) Take 1 tablet by mouth 2 (two) times daily.    [provider]  clonazePAM (KLONOPIN) 0.5 MG tablet Take 0.5-1 tablets (0.25-0.5 mg total) by mouth 2 (two) times daily as needed for anxiety. 10/12/22   Sharlene Dory, DO  Digestive Enzymes (DIGESTIVE ENZYME PO) Take by mouth daily at 12 noon.    [provider]  FLUoxetine (PROZAC) 10 MG tablet Take 1 tablet by mouth once daily 12/28/22   Sharlene Dory, DO  fluticasone [facility]) 50 MCG/ACT nasal spray Place 2 sprays into both nostrils daily. 09/10/19   Sharlene Dory, DO  MELATONIN PO Take by mouth  at bedtime. Free Sleep Aid    [provider]  Multiple Vitamin (MULTIVITAMIN) tablet Take 1 tablet by mouth 2 (two) times daily.    [provider]  NON FORMULARY 2 (two) times daily. Vegan Omega 3    [provider]  Probiotic Product (FORTIFY PROBIOTIC WOMENS EX ST PO) Take 1 tablet by mouth daily. 01/05/18   [provider]  spironolactone (ALDACTONE) 50 MG tablet Take 1 tablet by mouth once daily 12/28/22   Sharlene Dory, DO  tretinoin (RETIN-A) 0.025 % cream Apply topically at bedtime. 10/04/22   Sharlene Dory, DO  VALERIAN PO Take by mouth.    [provider]  vitamin E 180 MG (400 UNITS) capsule Take 400 Units by mouth daily.    [provider]    Family History Family History  Problem Relation Age of Onset   Hypertension Mother    Diabetes Mother    High blood pressure Mother    Depression Mother    Drug abuse Mother    Obesity Mother    Hypertension Father    Diabetes  Father    High Cholesterol Father    Heart disease Father    Cancer Father    Drug abuse Father    Hypertension Maternal Grandmother    Diabetes Maternal Grandmother    Hypertension Maternal Grandfather    Diabetes Maternal Grandfather    Cancer Paternal Grandmother        breast   Hypertension Paternal Grandmother    Diabetes Paternal Grandmother    Hypertension Paternal Grandfather    Diabetes Paternal Grandfather    Rectal cancer Other    Stomach cancer Other    Esophageal cancer Other    Colon polyps Other    Colon cancer Other     Social History Social History   Tobacco Use   Smoking status: Never   Smokeless tobacco: Never  Vaping Use   Vaping status: Never Used  Substance Use Topics   Alcohol use: Yes    Alcohol/week: 0.0 standard drinks of alcohol    Comment: cocktails 2 x week   Drug use: No     Allergies   Stadol [butorphanol] and Sulfa antibiotics   Review of Systems Review of Systems See HPI  Physical Exam Triage Vital Signs ED Triage Vitals [03/10/23 1324]  Encounter Vitals Group     BP 124/84     Systolic BP Percentile      Diastolic BP Percentile      Pulse Rate 88     Resp 17     Temp 97.8 F (36.6 C)     Temp Source Oral     SpO2 98 %     Weight      Height      Head Circumference      Peak Flow      Pain Score 5     Pain Loc      Pain Education      Exclude from Growth Chart    No data found.  Updated Vital Signs BP 124/84 (BP Location: Right Arm)   Pulse 88   Temp 97.8 F (36.6 C) (Oral)   Resp 17   SpO2 98%     Physical Exam Constitutional:      General: She is not in acute distress.    Appearance: She is well-developed.  HENT:     Head: Normocephalic and atraumatic.     Right Ear: Tympanic membrane normal.  Left Ear: Tympanic membrane, ear canal and external ear normal.     Ears:     Comments: Right canal is red and swollen with scant discharge.  Pain with traction pinna Eyes:     Conjunctiva/sclera:  Conjunctivae normal.     Pupils: Pupils are equal, round, and reactive to light.  Cardiovascular:     Rate and Rhythm: Normal rate.  Pulmonary:     Effort: Pulmonary effort is normal. No respiratory distress.  Abdominal:     General: There is no distension.     Palpations: Abdomen is soft.  Musculoskeletal:        General: Normal range of motion.     Cervical Richards: Normal range of motion.  Skin:    General: Skin is warm and dry.  Neurological:     Mental Status: She is alert.      UC Treatments / Results  Labs (all labs ordered are listed, but only abnormal results are displayed) Labs Reviewed - No data to display  EKG   Radiology No results found.  Procedures Procedures (including critical care time)  Medications Ordered in UC Medications - No data to display  Initial Impression / Assessment and Plan / UC Course  I have reviewed the triage vital signs and the nursing notes.  Pertinent labs & imaging results that were available during my care of the patient were reviewed by me and considered in my medical decision making (see chart for details).    Final Clinical Impressions(s) / UC Diagnoses   Final diagnoses:  Other infective acute otitis externa of right ear     Discharge Instructions      Use the ear drops 3 x a day for a week See your doctor if it fails to improve   ED Prescriptions     Medication Sig Dispense Auth. Provider   neomycin-polymyxin-hydrocortisone (CORTISPORIN) 3.5-10000-1 OTIC suspension Place 3 drops into the right ear 3 (three) times daily. 10 mL Eustace Moore, MD      PDMP not reviewed this encounter.   Eustace Moore, MD 03/10/23 1336

## 2023-03-10 NOTE — Discharge Instructions (Signed)
Use the ear drops 3 x a day for a week See your doctor if it fails to improve

## 2023-03-22 ENCOUNTER — Encounter (INDEPENDENT_AMBULATORY_CARE_PROVIDER_SITE_OTHER): Payer: Self-pay

## 2023-03-23 ENCOUNTER — Other Ambulatory Visit: Payer: Self-pay | Admitting: Oncology

## 2023-03-23 DIAGNOSIS — Z006 Encounter for examination for normal comparison and control in clinical research program: Secondary | ICD-10-CM

## 2023-04-11 ENCOUNTER — Encounter: Payer: Self-pay | Admitting: Family Medicine

## 2023-04-11 ENCOUNTER — Ambulatory Visit (INDEPENDENT_AMBULATORY_CARE_PROVIDER_SITE_OTHER): Payer: BC Managed Care – PPO | Admitting: Family Medicine

## 2023-04-11 VITALS — BP 118/68 | HR 42 | Temp 98.8°F | Ht 64.0 in | Wt 181.2 lb

## 2023-04-11 DIAGNOSIS — Z0000 Encounter for general adult medical examination without abnormal findings: Secondary | ICD-10-CM

## 2023-04-11 LAB — CBC
HCT: 41 % (ref 36.0–46.0)
Hemoglobin: 13.5 g/dL (ref 12.0–15.0)
MCHC: 33 g/dL (ref 30.0–36.0)
MCV: 89.6 fl (ref 78.0–100.0)
Platelets: 360 10*3/uL (ref 150.0–400.0)
RBC: 4.58 Mil/uL (ref 3.87–5.11)
RDW: 14.2 % (ref 11.5–15.5)
WBC: 4.7 10*3/uL (ref 4.0–10.5)

## 2023-04-11 LAB — COMPREHENSIVE METABOLIC PANEL
ALT: 22 U/L (ref 0–35)
AST: 19 U/L (ref 0–37)
Albumin: 3.9 g/dL (ref 3.5–5.2)
Alkaline Phosphatase: 72 U/L (ref 39–117)
BUN: 14 mg/dL (ref 6–23)
CO2: 30 meq/L (ref 19–32)
Calcium: 10.6 mg/dL — ABNORMAL HIGH (ref 8.4–10.5)
Chloride: 102 meq/L (ref 96–112)
Creatinine, Ser: 0.85 mg/dL (ref 0.40–1.20)
GFR: 80.86 mL/min (ref >60.00)
Glucose, Bld: 86 mg/dL (ref 70–99)
Potassium: 4.4 meq/L (ref 3.5–5.1)
Sodium: 138 meq/L (ref 135–145)
Total Bilirubin: 0.6 mg/dL (ref 0.2–1.2)
Total Protein: 7.4 g/dL (ref 6.0–8.3)

## 2023-04-11 LAB — LIPID PANEL
Cholesterol: 214 mg/dL — ABNORMAL HIGH (ref 0–200)
HDL: 63 mg/dL (ref >39.00)
LDL Cholesterol: 135 mg/dL — ABNORMAL HIGH (ref 0–99)
NonHDL: 150.6
Total CHOL/HDL Ratio: 3
Triglycerides: 78 mg/dL (ref 0.0–149.0)
VLDL: 15.6 mg/dL (ref 0.0–40.0)

## 2023-04-11 MED ORDER — PREDNISONE 20 MG PO TABS: 40.0000 mg | ORAL_TABLET | Freq: Every day | ORAL | 0 refills | Status: AC

## 2023-04-11 NOTE — Progress Notes (Signed)
Chief Complaint  Patient presents with   Annual Exam     Well Woman Kimberly Richards is here for a complete physical.   Her last physical was >1 year ago.  Current diet: in general, a "healthy" diet. Current exercise: pilates, walking. Weight is stable and she denies fatigue out of ordinary. No LMP recorded. Patient has had a hysterectomy..  Seatbelt? Yes Advanced directive? No  Health Maintenance Pap/HPV- Yes Mammogram- Yes Tetanus- Yes Hep C screening- Yes HIV screening- Yes  Past Medical History:  Diagnosis Date   Abnormal Pap smear of cervix    Acne    On Aldactone   Anemia    Anxiety with depression 12/18/2017   Cancer (HCC)    Constipation    H/O ovarian cancer    High cholesterol    Hot flashes    HPV (human papilloma virus) anogenital infection    Other hyperlipidemia 07/03/2022   Ovarian cyst    Primary hyperparathyroidism (HCC)      Past Surgical History:  Procedure Laterality Date   ABDOMINAL HYSTERECTOMY     BREAST BIOPSY Right    CESAREAN SECTION     x3   LAPAROSCOPIC HYSTERECTOMY     total   LAPAROTOMY     Ovarian mass removed  12/06/2011   Borderline Ovarian Cancer   SMALL INTESTINE SURGERY      Medications  Current Outpatient Medications on File Prior to Visit  Medication Sig Dispense Refill   Azelaic Acid 15 % gel APPLY TOPICALLY TO AFFECTED AREA TWICE DAILY IN THE MORNING AND EVENING AFTER SKIN HAS BEEN THOROUGHLY WASHED AND PATTED DRY 50 g 0   B Complex-C-Folic Acid (HM VITAMIN B COMPLEX/VITAMIN C PO) Take 1 tablet by mouth 2 (two) times daily.     clonazePAM (KLONOPIN) 0.5 MG tablet Take 0.5-1 tablets (0.25-0.5 mg total) by mouth 2 (two) times daily as needed for anxiety. 30 tablet 1   Digestive Enzymes (DIGESTIVE ENZYME PO) Take by mouth daily at 12 noon.     FLUoxetine (PROZAC) 10 MG tablet Take 1 tablet by mouth once daily 270 tablet 0   fluticasone (FLONASE) 50 MCG/ACT nasal spray Place 2 sprays into both nostrils daily. 16 g 6    MELATONIN PO Take by mouth at bedtime. Free Sleep Aid     Multiple Vitamin (MULTIVITAMIN) tablet Take 1 tablet by mouth 2 (two) times daily.     NON FORMULARY 2 (two) times daily. Vegan Omega 3     Probiotic Product (FORTIFY PROBIOTIC WOMENS EX ST PO) Take 1 tablet by mouth daily.     spironolactone (ALDACTONE) 50 MG tablet Take 1 tablet by mouth once daily 270 tablet 0   tretinoin (RETIN-A) 0.025 % cream Apply topically at bedtime. 45 g 2   VALERIAN PO Take by mouth.     vitamin E 180 MG (400 UNITS) capsule Take 400 Units by mouth daily.     Allergies Allergies  Allergen Reactions   Stadol [Butorphanol]    Sulfa Antibiotics     Other reaction(s): gi distress    Review of Systems: Constitutional:  no unexpected weight changes Eye:  no recent significant change in vision Ear/Nose/Mouth/Throat:  Ears:  +ear fullness on R Nose/Mouth/Throat:  no complaints of nasal congestion, no sore throat Cardiovascular: no chest pain Respiratory:  no shortness of breath Gastrointestinal:  no abdominal pain, no change in bowel habits GU:  Female: negative for dysuria or pelvic pain Musculoskeletal/Extremities:  no pain of the joints  Integumentary (Skin/Breast):  no abnormal skin lesions reported Neurologic:  no headaches Endocrine:  denies fatigue Hematologic/Lymphatic:  No areas of easy bleeding  Exam BP 118/68 (BP Location: Left Arm, Patient Position: Sitting, Cuff Size: Normal)   Pulse (!) 42   Temp 98.8 F (37.1 C) (Oral)   Ht 5\' 4"  (1.626 m)   Wt 181 lb 4 oz (82.2 kg)   SpO2 98%   BMI 31.11 kg/m  General:  well developed, well nourished, in no apparent distress Skin:  no significant moles, warts, or growths Head:  no masses, lesions, or tenderness Eyes:  pupils equal and round, sclera anicteric without injection Ears:  canals without lesions, TMs shiny without retraction, no obvious effusion, no erythema Nose:  nares patent, mucosa normal, and no drainage Throat/Pharynx:  lips  and gingiva without lesion; tongue and uvula midline; non-inflamed pharynx; no exudates or postnasal drainage Neck: neck supple without adenopathy, thyromegaly, or masses Lungs:  clear to auscultation, breath sounds equal bilaterally, no respiratory distress Cardio:  regular rhythm, bradycardic, no LE edema Abdomen:  abdomen soft, nontender; bowel sounds normal; no masses or organomegaly Genital: Defer to GYN Musculoskeletal:  symmetrical muscle groups noted without atrophy or deformity Extremities:  no clubbing, cyanosis, or edema, no deformities, no skin discoloration Neuro:  gait normal; deep tendon reflexes normal and symmetric Psych: well oriented with normal range of affect and appropriate judgment/insight  Assessment and Plan  Well adult exam - Plan: Comprehensive metabolic panel, CBC, Lipid panel   Well 47 y.o. female. Counseled on diet and exercise. Advanced directive form provided today.  Other orders as above. Follow up in 6 mo. The patient voiced understanding and agreement to the plan.  Jilda Roche [place], DO 04/11/23 8:40 AM

## 2023-04-11 NOTE — Patient Instructions (Addendum)
Give us 2-3 business days to get the results of your labs back.   Keep the diet clean and stay active.  Please get me a copy of your advanced directive form at your convenience.   I recommend getting the flu shot in mid October. This suggestion would change if the CDC comes out with a different recommendation.   Let us know if you need anything.  

## 2023-05-13 ENCOUNTER — Emergency Department (HOSPITAL_BASED_OUTPATIENT_CLINIC_OR_DEPARTMENT_OTHER)
Admission: EM | Admit: 2023-05-13 | Discharge: 2023-05-13 | Disposition: A | Discharge status: Home / Self Care | Payer: BC Managed Care – PPO | Attending: Emergency Medicine | Admitting: Emergency Medicine

## 2023-05-13 ENCOUNTER — Other Ambulatory Visit: Payer: Self-pay

## 2023-05-13 ENCOUNTER — Encounter (HOSPITAL_BASED_OUTPATIENT_CLINIC_OR_DEPARTMENT_OTHER): Payer: Self-pay

## 2023-05-13 DIAGNOSIS — Z8543 Personal history of malignant neoplasm of ovary: Secondary | ICD-10-CM | POA: Insufficient documentation

## 2023-05-13 DIAGNOSIS — H538 Other visual disturbances: Secondary | ICD-10-CM | POA: Diagnosis present

## 2023-05-13 MED ORDER — TETRACAINE HCL 0.5 % OP SOLN
2.0000 [drp] | Freq: Once | OPHTHALMIC | Status: AC
  Administered 2023-05-13: 2 [drp] via OPHTHALMIC
  Filled 2023-05-13: qty 4

## 2023-05-13 NOTE — ED Provider Notes (Signed)
[place] [facility] Provider Note   CSN: [id number] Arrival date & time: 05/13/23  1135     History  Chief Complaint  Patient presents with   Eye Problem    Kimberly Richards is a 47 y.o. female.  Patient here with floaters and blurred vision in the left eye since yesterday.  Denies any pain.  No headache weakness numbness tingling.  No speech changes.  No problems with the right eye.  No history of neuromuscular diseases including MS.  No history of the same in the past.  Denies any trauma.  No history of diabetes.  History of high cholesterol, ovarian cancer, hyperparathyroidism.  Not having any headaches.  The history is provided by the patient.       Home Medications Prior to Admission medications   Medication Sig Start Date End Date Taking? Authorizing Provider  Azelaic Acid 15 % gel APPLY TOPICALLY TO AFFECTED AREA TWICE DAILY IN THE MORNING AND EVENING AFTER SKIN HAS BEEN THOROUGHLY WASHED AND PATTED DRY 02/09/23   Wendling, Jilda Roche, DO  B Complex-C-Folic Acid (HM VITAMIN B COMPLEX/VITAMIN C PO) Take 1 tablet by mouth 2 (two) times daily.    [provider]  clonazePAM (KLONOPIN) 0.5 MG tablet Take 0.5-1 tablets (0.25-0.5 mg total) by mouth 2 (two) times daily as needed for anxiety. 10/12/22   Sharlene Dory, DO  Digestive Enzymes (DIGESTIVE ENZYME PO) Take by mouth daily at 12 noon.    [provider]  FLUoxetine (PROZAC) 10 MG tablet Take 1 tablet by mouth once daily 12/28/22   Sharlene Dory, DO  fluticasone [facility]) 50 MCG/ACT nasal spray Place 2 sprays into both nostrils daily. 09/10/19   Sharlene Dory, DO  MELATONIN PO Take by mouth at bedtime. Free Sleep Aid    [provider]  Multiple Vitamin (MULTIVITAMIN) tablet Take 1 tablet by mouth 2 (two) times daily.    [provider]  NON FORMULARY 2 (two) times daily. Vegan Omega 3    [provider]  Probiotic  Product (FORTIFY PROBIOTIC WOMENS EX ST PO) Take 1 tablet by mouth daily. 01/05/18   [provider]  spironolactone (ALDACTONE) 50 MG tablet Take 1 tablet by mouth once daily 12/28/22   Sharlene Dory, DO  tretinoin (RETIN-A) 0.025 % cream Apply topically at bedtime. 10/04/22   Sharlene Dory, DO  VALERIAN PO Take by mouth.    [provider]  vitamin E 180 MG (400 UNITS) capsule Take 400 Units by mouth daily.    [provider]      Allergies    Stadol [butorphanol] and Sulfa antibiotics    Review of Systems   Review of Systems  Physical Exam Updated Vital Signs BP 117/85 (BP Location: Left Arm)   Pulse 84   Temp 97.6 F (36.4 C)   Resp 16   Ht 5\' 4"  (1.626 m)   Wt 83.9 kg   SpO2 99%   BMI 31.76 kg/m  Physical Exam Vitals and nursing note reviewed.  Constitutional:      General: She is not in acute distress.    Appearance: She is well-developed.  HENT:     Head: Normocephalic and atraumatic.     Nose: Nose normal.     Mouth/Throat:     Mouth: Mucous membranes are moist.  Eyes:     Extraocular Movements: Extraocular movements intact.     Conjunctiva/sclera: Conjunctivae normal.  Pupils: Pupils are equal, round, and reactive to light.     Comments: Normal pressure in the eye, right visual fields intact, left vision she can see some numbers but mostly blurred throughout, no visual field deficit otherwise, no redness or conjunctival inflammation, no swelling around the orbital area, no pain with extraocular movements, limited funduscopic exam is unremarkable  Cardiovascular:     Rate and Rhythm: Normal rate and regular rhythm.     Pulses: Normal pulses.     Heart sounds: Normal heart sounds. No murmur heard. Pulmonary:     Effort: Pulmonary effort is normal. No respiratory distress.     Breath sounds: Normal breath sounds.  Abdominal:     Palpations: Abdomen is soft.     Tenderness: There is no abdominal tenderness.   Musculoskeletal:        General: No swelling.     Cervical back: Neck supple.  Skin:    General: Skin is warm and dry.     Capillary Refill: Capillary refill takes less than 2 seconds.  Neurological:     General: No focal deficit present.     Mental Status: She is alert and oriented to person, place, and time.     Cranial Nerves: No cranial nerve deficit.     Sensory: No sensory deficit.     Motor: No weakness.     Coordination: Coordination normal.     Comments: 5+ out of 5 strength throughout, normal sensation, no drift, normal speech  Psychiatric:        Mood and Affect: Mood normal.     ED Results / Procedures / Treatments   Labs (all labs ordered are listed, but only abnormal results are displayed) Labs Reviewed - No data to display  EKG None  Radiology No results found.  Procedures Procedures    Medications Ordered in ED Medications  tetracaine (PONTOCAINE) 0.5 % ophthalmic solution 2 drop (has no administration in time range)    ED Course/ Medical Decision Making/ A&P                                 Medical Decision Making Risk Prescription drug management.   Kimberly Richards is here with blurred right eye vision.  Normal vitals.  No fever.  History of ovarian cancer, high cholesterol, hyperparathyroidism.  She is got floaters and blurred vision in the left eye.  Right eye is normal.  Normal visual fields and normal vision in the right eye.  There is no signs of infection of either eye.  She is got normal eye pressure in the left eye.  I do not see any evidence to suggest glaucoma or traumatic process or infectious process.  Funduscopic exam is limited but unremarkable.  She is not having any eye pain or headache or stroke symptoms.  Differential diagnosis could be vitreous humor hemorrhage or retinal detachment or something more in the posterior eye.  Symptoms now since last night.  Could be MS but seems less likely.  Overall I talked with Dr. Zenaida Niece with  ophthalmology.  We have arranged for follow-up in the morning.  She understands return precautions.  Have no concern for stroke or other etiology other than a primary issue at this time.  She understands return precautions.  Discharged in good condition.  This chart was dictated using voice recognition software.  Despite best efforts to proofread,  errors can occur which can change the  documentation meaning.         Final Clinical Impression(s) / ED Diagnoses Final diagnoses:  Blurred vision, left eye    Rx / DC Orders ED Discharge Orders     None         Virgina Norfolk, DO 05/13/23 1303

## 2023-05-13 NOTE — ED Notes (Signed)
ED Provider at bedside. 

## 2023-05-13 NOTE — ED Triage Notes (Signed)
Pt reports that she is having "floating" objects in there left eye that started yesterday.

## 2023-05-14 ENCOUNTER — Other Ambulatory Visit: Payer: Self-pay | Admitting: Family Medicine

## 2023-05-14 DIAGNOSIS — L709 Acne, unspecified: Secondary | ICD-10-CM

## 2023-05-14 MED ORDER — AZELAIC ACID 15 % EX GEL
CUTANEOUS | 0 refills | Status: DC
Start: 2023-05-14 — End: 2023-09-17

## 2023-05-14 MED ORDER — AZELAIC ACID 15 % EX GEL
CUTANEOUS | 0 refills | Status: DC
Start: 2023-05-14 — End: 2023-05-14

## 2023-05-15 ENCOUNTER — Encounter (INDEPENDENT_AMBULATORY_CARE_PROVIDER_SITE_OTHER): Payer: Self-pay | Admitting: Ophthalmology

## 2023-05-15 ENCOUNTER — Ambulatory Visit (INDEPENDENT_AMBULATORY_CARE_PROVIDER_SITE_OTHER): Payer: BC Managed Care – PPO | Admitting: Ophthalmology

## 2023-05-15 ENCOUNTER — Encounter (HOSPITAL_COMMUNITY): Payer: Self-pay | Admitting: Ophthalmology

## 2023-05-15 DIAGNOSIS — H4312 Vitreous hemorrhage, left eye: Secondary | ICD-10-CM | POA: Diagnosis not present

## 2023-05-15 DIAGNOSIS — H35411 Lattice degeneration of retina, right eye: Secondary | ICD-10-CM

## 2023-05-15 DIAGNOSIS — H3322 Serous retinal detachment, left eye: Secondary | ICD-10-CM

## 2023-05-15 DIAGNOSIS — H33321 Round hole, right eye: Secondary | ICD-10-CM | POA: Diagnosis not present

## 2023-05-15 MED ORDER — PREDNISOLONE ACETATE 1 % OP SUSP: 1.0000 [drp] | Freq: Four times a day (QID) | OPHTHALMIC | 0 refills | Status: DC

## 2023-05-15 NOTE — Progress Notes (Signed)
PCP - Dr. Carmelia Roller  Cardiologist - n/a  PPM/ICD - n/a   Chest x-ray - denies EKG - 06/13/2022 Stress Test - denies ECHO - denies Cardiac Cath - denies  CPAP - n/a  Fasting Blood Sugar - n/a   Blood Thinner Instructions: n/a   ERAS Protcol - clears until 1130  COVID TEST- n/a  Anesthesia review: no  Patient verbally denies any shortness of breath, fever, cough and chest pain during phone call   -------------  SDW INSTRUCTIONS given:  Your procedure is scheduled on 05/16/2023.  Report to [facility] Main Entrance "A" at 1200 P.M., and check in at the Admitting office.  Call this number if you have problems the morning of surgery:  [phone]   Remember:  Do not eat after midnight the night before your surgery  You may drink clear liquids until 1130 the morning of your surgery.   Clear liquids allowed are: Water, Non-Citrus Juices (without pulp), Carbonated Beverages, Clear Tea, Black Coffee Only, and Gatorade    Take these medicines the morning of surgery with A SIP OF WATER Klonopin and prozac  As of today, STOP taking any Aspirin (unless otherwise instructed by your surgeon) Aleve, Naproxen, Ibuprofen, Motrin, Advil, Goody's, BC's, all herbal medications, fish oil, and all vitamins.                      Do not wear jewelry, make up, or nail polish            Do not wear lotions, powders, perfumes/colognes, or deodorant.            Do not shave 48 hours prior to surgery.  Men may shave face and neck.            Do not bring valuables to the hospital.            [facility] is not responsible for any belongings or valuables.  Do NOT Smoke (Tobacco/Vaping) 24 hours prior to your procedure If you use a CPAP at night, you may bring all equipment for your overnight stay.   Contacts, glasses, dentures or bridgework may not be worn into surgery.      For patients admitted to the hospital, discharge time will be determined by your treatment team.   Patients  discharged the day of surgery will not be allowed to drive home, and someone needs to stay with them for 24 hours.    Special instructions:   [place]- Preparing For Surgery  Before surgery, you can play an important role. Because skin is not sterile, your skin needs to be as free of germs as possible. You can reduce the number of germs on your skin by washing with CHG (chlorahexidine gluconate) Soap before surgery.  CHG is an antiseptic cleaner which kills germs and bonds with the skin to continue killing germs even after washing.    Oral Hygiene is also important to reduce your risk of infection.  Remember - BRUSH YOUR TEETH THE MORNING OF SURGERY WITH YOUR REGULAR TOOTHPASTE  Please do not use if you have an allergy to CHG or antibacterial soaps. If your skin becomes reddened/irritated stop using the CHG.  Do not shave (including legs and underarms) for at least 48 hours prior to first CHG shower. It is OK to shave your face.  Please follow these instructions carefully.   Shower the NIGHT BEFORE SURGERY and the MORNING OF SURGERY with DIAL Soap.   Pat yourself dry  with a CLEAN TOWEL.  Wear CLEAN PAJAMAS to bed the night before surgery  Place CLEAN SHEETS on your bed the night of your first shower and DO NOT SLEEP WITH PETS.   Day of Surgery: Please shower morning of surgery  Wear Clean/Comfortable clothing the morning of surgery Do not apply any deodorants/lotions.   Remember to brush your teeth WITH YOUR REGULAR TOOTHPASTE.   Questions were answered. Patient verbalized understanding of instructions.    Patient states that her husband Marquita Palms, will be bringing her, will take her home and stay with her for 24 hours after the procedure.

## 2023-05-15 NOTE — H&P (Signed)
Kimberly Richards is an 47 y.o. female.    Chief Complaint: vision loss, left eye  HPI: Pt with a 4 day history of progressive vision loss OS. 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 After a discussion of the risks, benefits and alternatives to surgery, the patient has elected to proceed with surgery to clear the vitreous hemorrhage and repair the retinal detachment with retinal tears -- 25g PPV w/ PFO, endolaser and gas OS under general anesthesia.  Past Medical History:  Diagnosis Date   Abnormal Pap smear of cervix    Acne    On Aldactone   Anemia    Anxiety with depression 12/18/2017   Cancer (HCC)    Constipation    H/O ovarian cancer    High cholesterol    Hot flashes    HPV (human papilloma virus) anogenital infection    Other hyperlipidemia 07/03/2022   Ovarian cyst    Primary hyperparathyroidism (HCC)     Past Surgical History:  Procedure Laterality Date   ABDOMINAL HYSTERECTOMY     BREAST BIOPSY Right    CESAREAN SECTION     x3   LAPAROSCOPIC HYSTERECTOMY     total   LAPAROTOMY     Ovarian mass removed  12/06/2011   Borderline Ovarian Cancer   SMALL INTESTINE SURGERY      Family History  Problem Relation Age of Onset   Hypertension Mother    Diabetes Mother    High blood pressure Mother    Depression Mother    Drug abuse Mother    Obesity Mother    Hypertension Father    Diabetes Father    High Cholesterol Father    Heart disease Father    Cancer Father    Drug abuse Father    Hypertension Maternal Grandmother    Diabetes Maternal Grandmother    Hypertension Maternal Grandfather    Diabetes Maternal Grandfather    Cancer Paternal Grandmother        breast   Hypertension Paternal Grandmother    Diabetes Paternal Grandmother    Hypertension Paternal Grandfather    Diabetes Paternal Grandfather     Rectal cancer Other    Stomach cancer Other    Esophageal cancer Other    Colon polyps Other    Colon cancer Other    Social History:  reports that she has never smoked. She has never used smokeless tobacco. She reports current alcohol use. She reports that she does not use drugs.  Allergies:  Allergies  Allergen Reactions   Stadol [Butorphanol]    Sulfa Antibiotics     Other reaction(s): gi distress    No medications prior to admission.    Review of systems otherwise negative  There were no vitals taken for this visit.  Physical exam: Mental status: oriented x3. Eyes: See eye exam associated with this date of surgery Ears, Nose, Throat: within normal limits Neck: Within Normal limits General: within normal limits Chest: Within normal limits Breast: deferred Heart: Within normal limits Abdomen: Within normal limits GU: deferred Extremities: within normal limits Skin: within normal limits  Assessment/Plan 1. Vitreous hemorrhage, LEFT EYE 2. Retinal detachment, LEFT EYE  Plan: To [facility] for urgent 25g PPV w/ PFO, endolaser and gas, LEFT EYE, under general anesthesia - case scheduled for Wednesday, 10.09.24, 230 pm, [facility] OR 08  Karie Chimera, M.D.,  Ph.D. Vitreoretinal Surgeon [facility]

## 2023-05-15 NOTE — Progress Notes (Signed)
[facility] - Clinic Note  05/15/2023   CHIEF COMPLAINT Patient presents for Retina Evaluation  HISTORY OF PRESENT ILLNESS: Kimberly Richards is a 47 y.o. female who presents to the clinic today for:  HPI     Retina Evaluation   In left eye.  This started 4 days ago.  Duration of 4 days.  Associated Symptoms Flashes and Floaters.  Response to treatment was no improvement.  I, the attending physician,  performed the HPI with the patient and updated documentation appropriately.        Comments   New pt here for possible tear/detachment OS. Pt states Saturday afternoon she walked outside into the sun and started seeing green squiggles. It progressed throughout the day to be 'misty/static' vision. Pt is able to see figure but not details. Still blurry haze, seeing brown floaters.       Last edited by [name], MD on 05/15/2023 12:55 PM.    Pt is here on the referral of Dr. Bascom Levels for concern of RD OS, pt states she went to the ED on Sunday, but they didn't do anything for her and just told her to follow up at [facility] office, pt states she had ovarian cancer in 2012 and had the ovary removed, she had another baby afterwards, and then had to have a hysterectomy, pt denies being diabetic or hypertensive  Referring physician: Fraz[place], [place], OD [address] [place],  [place] [address]  HISTORICAL INFORMATION:  Selected notes from the MEDICAL RECORD NUMBER Referred by Dr. Bascom Levels for concern of RD OS LEE:  Ocular Hx- PMH-   CURRENT MEDICATIONS: Current Outpatient Medications (Ophthalmic Drugs)  Medication Sig   prednisoLONE acetate (PRED FORTE) 1 % ophthalmic suspension Place 1 drop into the right eye 4 (four) times daily.   No current facility-administered medications for this visit. (Ophthalmic Drugs)   Current Outpatient Medications (Other)  Medication Sig   Azelaic Acid 15 % gel APPLY TOPICALLY TO AFFECTED AREA TWICE DAILY IN THE MORNING AND  EVENING AFTER SKIN HAS BEEN THOROUGHLY WASHED AND PATTED DRY   B Complex-C-Folic Acid (HM VITAMIN B COMPLEX/VITAMIN C PO) Take 1 tablet by mouth 2 (two) times daily.   clonazePAM (KLONOPIN) 0.5 MG tablet Take 0.5-1 tablets (0.25-0.5 mg total) by mouth 2 (two) times daily as needed for anxiety.   Digestive Enzymes (DIGESTIVE ENZYME PO) Take by mouth daily at 12 noon.   FLUoxetine (PROZAC) 10 MG tablet Take 1 tablet by mouth once daily   MELATONIN PO Take by mouth at bedtime. Free Sleep Aid   Multiple Vitamin (MULTIVITAMIN) tablet Take 1 tablet by mouth 2 (two) times daily.   NON FORMULARY 2 (two) times daily. Vegan Omega 3   Probiotic Product (FORTIFY PROBIOTIC WOMENS EX ST PO) Take 1 tablet by mouth daily.   spironolactone (ALDACTONE) 50 MG tablet Take 1 tablet by mouth once daily   tretinoin (RETIN-A) 0.025 % cream Apply topically at bedtime.   VALERIAN PO Take by mouth.   vitamin E 180 MG (400 UNITS) capsule Take 400 Units by mouth daily.   fluticasone (FLONASE) 50 MCG/ACT nasal spray Place 2 sprays into both nostrils daily. (Patient not taking: Reported on 05/15/2023)   No current facility-administered medications for this visit. (Other)   REVIEW OF SYSTEMS: ROS   Positive for: Neurological, Skin, Eyes Negative for: Constitutional, Gastrointestinal, Genitourinary, Musculoskeletal, HENT, Endocrine, Cardiovascular, Respiratory, Psychiatric, Allergic/Imm, Heme/Lymph Last edited by Thompson Grayer, COT on 05/15/2023  8:13 AM.     ALLERGIES Allergies  Allergen Reactions   Stadol [Butorphanol]    Sulfa Antibiotics     Other reaction(s): gi distress   PAST MEDICAL HISTORY Past Medical History:  Diagnosis Date   Abnormal Pap smear of cervix    Acne    On Aldactone   Anemia    Anxiety with depression 12/18/2017   Cancer (HCC)    Constipation    H/O ovarian cancer    High cholesterol    Hot flashes    HPV (human papilloma virus) anogenital infection    Other hyperlipidemia  07/03/2022   Ovarian cyst    Primary hyperparathyroidism (HCC)    Past Surgical History:  Procedure Laterality Date   ABDOMINAL HYSTERECTOMY     BREAST BIOPSY Right    CESAREAN SECTION     x3   LAPAROSCOPIC HYSTERECTOMY     total   LAPAROTOMY     Ovarian mass removed  12/06/2011   Borderline Ovarian Cancer   SMALL INTESTINE SURGERY     FAMILY HISTORY Family History  Problem Relation Age of Onset   Hypertension Mother    Diabetes Mother    High blood pressure Mother    Depression Mother    Drug abuse Mother    Obesity Mother    Hypertension Father    Diabetes Father    High Cholesterol Father    Heart disease Father    Cancer Father    Drug abuse Father    Hypertension Maternal Grandmother    Diabetes Maternal Grandmother    Hypertension Maternal Grandfather    Diabetes Maternal Grandfather    Cancer Paternal Grandmother        breast   Hypertension Paternal Grandmother    Diabetes Paternal Grandmother    Hypertension Paternal Grandfather    Diabetes Paternal Grandfather    Rectal cancer Other    Stomach cancer Other    Esophageal cancer Other    Colon polyps Other    Colon cancer Other    SOCIAL HISTORY Social History   Tobacco Use   Smoking status: Never   Smokeless tobacco: Never  Vaping Use   Vaping status: Never Used  Substance Use Topics   Alcohol use: Yes    Alcohol/week: 0.0 standard drinks of alcohol    Comment: cocktails 2 x week   Drug use: No       OPHTHALMIC EXAM:  Base Eye Exam     Visual Acuity (Snellen - Linear)       Right Left   Dist cc 20/25 +2 CF@1 '   Dist ph cc 20/20 NI    Correction: Glasses         Tonometry (Tonopen, 8:22 AM)       Right Left   Pressure 15 13         Pupils       Pupils Dark Light Shape React APD   Right PERRL 3 2 Round Brisk None   Left PERRL 3 2 Round Brisk None         Visual Fields       Left Right     Full   Restrictions Total superior nasal, inferior nasal deficiencies;  Partial inner superior temporal, inferior temporal deficiencies          Extraocular Movement       Right Left    Full, Ortho Full, Ortho         Neuro/Psych     Oriented  x3: Yes   Mood/Affect: Normal         Dilation     Both eyes: 1.0% Mydriacyl, 2.5% Phenylephrine @ 8:23 AM           Slit Lamp and Fundus Exam     Slit Lamp Exam       Right Left   Lids/Lashes Normal Normal   Conjunctiva/Sclera mild melanosis mild melanosis   Cornea arcus, trace tear film debris Mild arcus, trace tear film debris   Anterior Chamber deep and clear deep and clear   Iris Round and dilated Round and dilated   Lens Clear Clear   Anterior Vitreous mild syneresis syneresis, +RBCs, diffuse VH         Fundus Exam       Right Left   Disc Pink and Sharp, mild PPP not visible   C/D Ratio 0.6    Macula Flat, Good foveal reflex, RPE mottling, No heme or edema not visible   Vessels Normal not visible   Periphery Attached, patches of lattice degeneration at 1100 and from 0430-0600, smaller patches of lattice at 0300 and 0730 equator 2 large tears SN quad at 1000 and 1100 with +SRF; no other details visible elsewhere           Refraction     Wearing Rx       Sphere Cylinder Axis Add   Right -6.50 +4.75 094 +2.00   Left -6.25 +4.00 091 +2.00           IMAGING AND PROCEDURES  Imaging and Procedures for 05/15/2023  OCT, Retina - OU - Both Eyes       Right Eye Quality was good. Central Foveal Thickness: 270. Progression has no prior data. Findings include normal foveal contour, no IRF, no SRF, vitreomacular adhesion .   Notes *Images captured and stored on drive  Diagnosis / Impression:  OF: NFP, no IRF/SRF OS: no images obtained  Clinical management:  See below  Abbreviations: NFP - Normal foveal profile. CME - cystoid macular edema. PED - pigment epithelial detachment. IRF - intraretinal fluid. SRF - subretinal fluid. EZ - ellipsoid zone. ERM - epiretinal  membrane. ORA - outer retinal atrophy. ORT - outer retinal tubulation. SRHM - subretinal hyper-reflective material. IRHM - intraretinal hyper-reflective material      Repair Retinal Breaks, Laser - OD - Right Eye       LASER PROCEDURE NOTE  Procedure:  Barrier laser retinopexy using slit lamp laser, RIGHT eye   Diagnosis:   Lattice degeneration w/ atrophic holes, RIGHT eye                     patches of lattice degeneration at 1100 and from 0430-0600, smaller patches of lattice at 0300 and 0730 equator  Surgeon: [name], MD, PhD  Anesthesia: Topical, subconjunctival block -- 2% lidocaine  Informed consent obtained, operative eye marked, and time out performed prior to initiation of laser.   Laser settings:  Lumenis Smart532 laser, slit lamp Lens: Mainster PRP 165 Power: 280-300 mW Spot size: 200 microns Duration: 30 msec  # spots: 1157  Placement of laser: Using a Mainster PRP 165 contact lens at the slit lamp, laser was placed in three confluent rows around patches of lattice w/ atrophic holes at 1100 and from 0430-0600, smaller patches of lattice at 0300 and 0730 equator.  Complications: None.  Patient tolerated the procedure well and received written and verbal post-procedure care information/education.  B-Scan Ultrasound - OS - Left Eye       Quality was good. Findings included vitreous hemorrhage, vitreous opacities.   Notes **Images stored on drive**  Impression: OD: vitreous opacities consistent with hemorrhage; 2 retinal tears within focal retinal detachment in superonasal quadrant--tears at 10 and 11 oclock           ASSESSMENT/PLAN:   ICD-10-CM   1. Left retinal detachment  H33.22 OCT, Retina - OU - Both Eyes    B-Scan Ultrasound - OS - Left Eye    2. Vitreous hemorrhage of left eye (HCC)  H43.12     3. Lattice degeneration of right retina  H35.411 Repair Retinal Breaks, Laser - OD - Right Eye    4. Retinal hole of right eye  H33.321  Repair Retinal Breaks, Laser - OD - Right Eye     1,2. Rhegmatogenous retinal detachment with vitreous hemorrhage, OS  - pt reports progressive vision loss OS w/ onset on Saturday, 10.05.24, of green blobs in vision  - denies photopsias  - vision became increasing blurry and presented to ED on Sunday, 10.06.24  - outpt follow up with Dr. Bascom Levels on 10.07.24 - exam today shows diffuse VH (no view of posterior pole), two large retinal tears at 1000 and 1100 (superonasal periphery) with +SRF - B-scan ultrasound shows focal peripheral detachment in superonasal quad with tears at 1000 and 1100, matching exam - The incidence, risk factors, and natural history of retinal detachment was discussed with patient.   - Potential treatment options including delimiting laser, pneumatic retinopexy, scleral buckle, and vitrectomy, cryotherapy and laser, and the use of air, gas, and oil discussed with patient. - The risks of blindness, loss of vision, infection, hemorrhage, cataract progression or lens displacement were discussed with patient. - recommend urgent 25g PPV w/ EL and Gas OS under general anesthesia - pt wishes to proceed with surgery - RBA of procedure discussed, questions answered - informed consent obtained and signed - case scheduled for Wednesday, October 9th at 2:30, [facility], OR 08 - f/u here POD1  3,4. Lattice degeneration w/ atrophic holes, right eye - patches of lattice at 11 and from 0430-0600, smaller patches of lattice at 0300 and 0730 equator - discussed findings, prognosis, and treatment options including observation - recommend laser retinopexy OD today, 10.08.24 - pt wishes to proceed with laser - RBA of procedure discussed, questions answered - informed consent obtained and signed - see procedure note - start PF QID OD x7 days - f/u in 2-3 wks, sooner prn -- POV   Ophthalmic Meds Ordered this visit:  Meds ordered this encounter  Medications   prednisoLONE acetate (PRED FORTE)  1 % ophthalmic suspension    Sig: Place 1 drop into the right eye 4 (four) times daily.    Dispense:  15 mL    Refill:  0     Return in about 2 days (around 05/17/2023) for f/u RD OS, POV, DFE.  There are no Patient Instructions on file for this visit.  Explained the diagnoses, plan, and follow up with the patient and they expressed understanding.  Patient expressed understanding of the importance of proper follow up care.   This document serves as a record of services personally performed by Karie Chimera, MD, PhD. It was created on their behalf by Glee Arvin. Manson Passey, OA an ophthalmic technician. The creation of this record is the provider's dictation and/or activities during the visit.    Electronically signed by: Glee Arvin.  Manson Passey, OA 05/15/23 4:46 PM  Karie Chimera, M.D., Ph.D. Diseases & Surgery of the Retina and Vitreous [facility] 05/15/2023  I have reviewed the above documentation for accuracy and completeness, and I agree with the above. Karie Chimera, M.D., Ph.D. 05/15/23 4:47 PM   Abbreviations: M myopia (nearsighted); A astigmatism; H hyperopia (farsighted); P presbyopia; Mrx spectacle prescription;  CTL contact lenses; OD right eye; OS left eye; OU both eyes  XT exotropia; ET esotropia; PEK punctate epithelial keratitis; PEE punctate epithelial erosions; DES dry eye syndrome; MGD meibomian gland dysfunction; ATs artificial tears; PFAT's preservative free artificial tears; NSC nuclear sclerotic cataract; PSC posterior subcapsular cataract; ERM epi-retinal membrane; PVD posterior vitreous detachment; RD retinal detachment; DM diabetes mellitus; DR diabetic retinopathy; NPDR non-proliferative diabetic retinopathy; PDR proliferative diabetic retinopathy; CSME clinically significant macular edema; DME diabetic macular edema; dbh dot blot hemorrhages; CWS cotton wool spot; POAG primary open angle glaucoma; C/D cup-to-disc ratio; HVF humphrey visual field; GVF  goldmann visual field; OCT optical coherence tomography; IOP intraocular pressure; BRVO Branch retinal vein occlusion; CRVO central retinal vein occlusion; CRAO central retinal artery occlusion; BRAO branch retinal artery occlusion; RT retinal tear; SB scleral buckle; PPV pars plana vitrectomy; VH Vitreous hemorrhage; PRP panretinal laser photocoagulation; IVK intravitreal kenalog; VMT vitreomacular traction; MH Macular hole;  NVD neovascularization of the disc; NVE neovascularization elsewhere; AREDS age related eye disease study; ARMD age related macular degeneration; POAG primary open angle glaucoma; EBMD epithelial/anterior basement membrane dystrophy; ACIOL anterior chamber intraocular lens; IOL intraocular lens; PCIOL posterior chamber intraocular lens; Phaco/IOL phacoemulsification with intraocular lens placement; PRK photorefractive keratectomy; LASIK laser assisted in situ keratomileusis; HTN hypertension; DM diabetes mellitus; COPD chronic obstructive pulmonary disease

## 2023-05-16 ENCOUNTER — Encounter (HOSPITAL_COMMUNITY)
Admission: RE | Disposition: A | Discharge status: Home / Self Care | Payer: Self-pay | Source: Home / Self Care | Attending: Ophthalmology

## 2023-05-16 ENCOUNTER — Ambulatory Visit (HOSPITAL_COMMUNITY)
Admission: RE | Admit: 2023-05-16 | Discharge: 2023-05-16 | Disposition: A | Discharge status: Home / Self Care | Payer: BC Managed Care – PPO | Attending: Ophthalmology | Admitting: Ophthalmology

## 2023-05-16 ENCOUNTER — Ambulatory Visit (HOSPITAL_COMMUNITY): Payer: BC Managed Care – PPO | Admitting: Anesthesiology

## 2023-05-16 ENCOUNTER — Encounter (HOSPITAL_COMMUNITY): Payer: Self-pay | Admitting: Ophthalmology

## 2023-05-16 DIAGNOSIS — H4312 Vitreous hemorrhage, left eye: Secondary | ICD-10-CM | POA: Diagnosis present

## 2023-05-16 DIAGNOSIS — Z789 Other specified health status: Secondary | ICD-10-CM | POA: Diagnosis present

## 2023-05-16 DIAGNOSIS — Z90710 Acquired absence of both cervix and uterus: Secondary | ICD-10-CM | POA: Diagnosis not present

## 2023-05-16 DIAGNOSIS — H33002 Unspecified retinal detachment with retinal break, left eye: Secondary | ICD-10-CM | POA: Insufficient documentation

## 2023-05-16 DIAGNOSIS — H3322 Serous retinal detachment, left eye: Secondary | ICD-10-CM

## 2023-05-16 HISTORY — PX: PHOTOCOAGULATION WITH LASER: SHX6027

## 2023-05-16 HISTORY — DX: Other specified postprocedural states: Z98.890

## 2023-05-16 HISTORY — PX: GAS INSERTION: SHX5336

## 2023-05-16 HISTORY — PX: PARS PLANA VITRECTOMY: SHX2166

## 2023-05-16 SURGERY — PARS PLANA VITRECTOMY WITH 25 GAUGE
Anesthesia: General | Site: Eye | Laterality: Left

## 2023-05-16 MED ORDER — OXYCODONE HCL 5 MG PO TABS
ORAL_TABLET | ORAL | Status: AC
  Filled 2023-05-16: qty 1

## 2023-05-16 MED ORDER — DEXAMETHASONE SODIUM PHOSPHATE 10 MG/ML IJ SOLN
INTRAMUSCULAR | Status: DC | PRN
  Administered 2023-05-16: 10 mg via INTRAVENOUS

## 2023-05-16 MED ORDER — FENTANYL CITRATE (PF) 250 MCG/5ML IJ SOLN
INTRAMUSCULAR | Status: DC | PRN
  Administered 2023-05-16 (×2): 50 ug via INTRAVENOUS
  Administered 2023-05-16: 100 ug via INTRAVENOUS

## 2023-05-16 MED ORDER — PROPOFOL 10 MG/ML IV BOLUS
INTRAVENOUS | Status: DC | PRN
  Administered 2023-05-16: 200 mg via INTRAVENOUS

## 2023-05-16 MED ORDER — ATROPINE SULFATE 1 % OP SOLN
1.0000 [drp] | OPHTHALMIC | Status: AC | PRN
  Administered 2023-05-16 (×3): 1 [drp] via OPHTHALMIC
  Filled 2023-05-16: qty 2

## 2023-05-16 MED ORDER — EPINEPHRINE PF 1 MG/ML IJ SOLN
INTRAMUSCULAR | Status: AC
  Filled 2023-05-16: qty 1

## 2023-05-16 MED ORDER — ONDANSETRON HCL 4 MG/2ML IJ SOLN
INTRAMUSCULAR | Status: AC
  Filled 2023-05-16: qty 2

## 2023-05-16 MED ORDER — DEXAMETHASONE SODIUM PHOSPHATE 10 MG/ML IJ SOLN
INTRAMUSCULAR | Status: AC
  Filled 2023-05-16: qty 1

## 2023-05-16 MED ORDER — CEFTAZIDIME 1 G IJ SOLR
INTRAMUSCULAR | Status: AC
  Filled 2023-05-16: qty 1

## 2023-05-16 MED ORDER — PROPARACAINE HCL 0.5 % OP SOLN
1.0000 [drp] | OPHTHALMIC | Status: AC | PRN
  Administered 2023-05-16 (×3): 1 [drp] via OPHTHALMIC
  Filled 2023-05-16: qty 15

## 2023-05-16 MED ORDER — CHLORHEXIDINE GLUCONATE 0.12 % MT SOLN
OROMUCOSAL | Status: AC
  Administered 2023-05-16: 15 mL via OROMUCOSAL
  Filled 2023-05-16: qty 15

## 2023-05-16 MED ORDER — PROPOFOL 10 MG/ML IV BOLUS
INTRAVENOUS | Status: AC
  Filled 2023-05-16: qty 20

## 2023-05-16 MED ORDER — TRIAMCINOLONE ACETONIDE 40 MG/ML IJ SUSP
INTRAMUSCULAR | Status: DC | PRN
  Administered 2023-05-16: 40 mg

## 2023-05-16 MED ORDER — ORAL CARE MOUTH RINSE: 15.0000 mL | Freq: Once | OROMUCOSAL | Status: AC

## 2023-05-16 MED ORDER — GATIFLOXACIN 0.5 % OP SOLN
OPHTHALMIC | Status: AC
  Filled 2023-05-16: qty 2.5

## 2023-05-16 MED ORDER — PHENYLEPHRINE HCL 10 % OP SOLN
1.0000 [drp] | OPHTHALMIC | Status: AC | PRN
  Administered 2023-05-16 (×3): 1 [drp] via OPHTHALMIC
  Filled 2023-05-16: qty 5

## 2023-05-16 MED ORDER — ACETAZOLAMIDE SODIUM 500 MG IJ SOLR
INTRAMUSCULAR | Status: AC
  Filled 2023-05-16: qty 500

## 2023-05-16 MED ORDER — PHENYLEPHRINE HCL-NACL 20-0.9 MG/250ML-% IV SOLN
INTRAVENOUS | Status: DC | PRN
  Administered 2023-05-16: 40 ug via INTRAVENOUS

## 2023-05-16 MED ORDER — NA CHONDROIT SULF-NA HYALURON 40-30 MG/ML IO SOSY
INTRAOCULAR | Status: AC
  Filled 2023-05-16: qty 1

## 2023-05-16 MED ORDER — CARBACHOL 0.01 % IO SOLN
INTRAOCULAR | Status: AC
  Filled 2023-05-16: qty 1.5

## 2023-05-16 MED ORDER — GATIFLOXACIN 0.5 % OP SOLN
OPHTHALMIC | Status: DC | PRN
  Administered 2023-05-16: 1 [drp] via OPHTHALMIC

## 2023-05-16 MED ORDER — FENTANYL CITRATE (PF) 250 MCG/5ML IJ SOLN
INTRAMUSCULAR | Status: AC
  Filled 2023-05-16: qty 5

## 2023-05-16 MED ORDER — NA CHONDROIT SULF-NA HYALURON 40-30 MG/ML IO SOSY
INTRAOCULAR | Status: DC | PRN
  Administered 2023-05-16: .5 mL via INTRAOCULAR

## 2023-05-16 MED ORDER — SODIUM CHLORIDE (PF) 0.9 % IJ SOLN
INTRAMUSCULAR | Status: AC
  Filled 2023-05-16: qty 10

## 2023-05-16 MED ORDER — OXYCODONE HCL 5 MG/5ML PO SOLN: 5.0000 mg | Freq: Once | ORAL | Status: AC | PRN

## 2023-05-16 MED ORDER — POLYMYXIN B SULFATE 500000 UNITS IJ SOLR
INTRAMUSCULAR | Status: AC
  Filled 2023-05-16: qty 10

## 2023-05-16 MED ORDER — BRIMONIDINE TARTRATE 0.2 % OP SOLN
OPHTHALMIC | Status: AC
  Filled 2023-05-16: qty 5

## 2023-05-16 MED ORDER — BACITRACIN-POLYMYXIN B 500-10000 UNIT/GM OP OINT
TOPICAL_OINTMENT | OPHTHALMIC | Status: AC
  Filled 2023-05-16: qty 3.5

## 2023-05-16 MED ORDER — ATROPINE SULFATE 1 % OP SOLN
OPHTHALMIC | Status: DC | PRN
Start: 2023-05-16 — End: 2023-05-16
  Administered 2023-05-16: 1 [drp] via OPHTHALMIC

## 2023-05-16 MED ORDER — BSS IO SOLN
INTRAOCULAR | Status: AC
  Filled 2023-05-16: qty 15

## 2023-05-16 MED ORDER — TRIAMCINOLONE ACETONIDE 40 MG/ML IJ SUSP
INTRAMUSCULAR | Status: AC
  Filled 2023-05-16: qty 5

## 2023-05-16 MED ORDER — ONDANSETRON HCL 4 MG/2ML IJ SOLN: 4.0000 mg | Freq: Once | INTRAMUSCULAR | Status: DC | PRN

## 2023-05-16 MED ORDER — DORZOLAMIDE HCL-TIMOLOL MAL 2-0.5 % OP SOLN
OPHTHALMIC | Status: AC
  Filled 2023-05-16: qty 10

## 2023-05-16 MED ORDER — PREDNISOLONE ACETATE 1 % OP SUSP
OPHTHALMIC | Status: AC
  Filled 2023-05-16: qty 5

## 2023-05-16 MED ORDER — MIDAZOLAM HCL 2 MG/2ML IJ SOLN
INTRAMUSCULAR | Status: AC
  Filled 2023-05-16: qty 2

## 2023-05-16 MED ORDER — HYDROMORPHONE HCL 1 MG/ML IJ SOLN
INTRAMUSCULAR | Status: AC
  Filled 2023-05-16: qty 1

## 2023-05-16 MED ORDER — ROCURONIUM BROMIDE 10 MG/ML (PF) SYRINGE
PREFILLED_SYRINGE | INTRAVENOUS | Status: DC | PRN
  Administered 2023-05-16: 20 mg via INTRAVENOUS
  Administered 2023-05-16: 30 mg via INTRAVENOUS
  Administered 2023-05-16: 70 mg via INTRAVENOUS

## 2023-05-16 MED ORDER — PREDNISOLONE ACETATE 1 % OP SUSP
OPHTHALMIC | Status: DC | PRN
  Administered 2023-05-16: 1 [drp] via OPHTHALMIC

## 2023-05-16 MED ORDER — GLYCOPYRROLATE 0.2 MG/ML IJ SOLN
INTRAMUSCULAR | Status: DC | PRN
Start: 2023-05-16 — End: 2023-05-16
  Administered 2023-05-16: .2 mg via INTRAVENOUS

## 2023-05-16 MED ORDER — CHLORHEXIDINE GLUCONATE 0.12 % MT SOLN: 15.0000 mL | Freq: Once | OROMUCOSAL | Status: AC

## 2023-05-16 MED ORDER — SUGAMMADEX SODIUM 200 MG/2ML IV SOLN
INTRAVENOUS | Status: DC | PRN
  Administered 2023-05-16: 200 mg via INTRAVENOUS

## 2023-05-16 MED ORDER — DORZOLAMIDE HCL-TIMOLOL MAL 2-0.5 % OP SOLN
OPHTHALMIC | Status: DC | PRN
  Administered 2023-05-16: 1 [drp] via OPHTHALMIC

## 2023-05-16 MED ORDER — BRIMONIDINE TARTRATE 0.2 % OP SOLN
OPHTHALMIC | Status: DC | PRN
  Administered 2023-05-16: 1 [drp] via OPHTHALMIC

## 2023-05-16 MED ORDER — STERILE WATER FOR INJECTION IJ SOLN
INTRAMUSCULAR | Status: DC | PRN
  Administered 2023-05-16: 20 mL

## 2023-05-16 MED ORDER — LIDOCAINE 2% (20 MG/ML) 5 ML SYRINGE
INTRAMUSCULAR | Status: DC | PRN
  Administered 2023-05-16: 40 mg via INTRAVENOUS

## 2023-05-16 MED ORDER — HYDROMORPHONE HCL 1 MG/ML IJ SOLN
0.2500 mg | INTRAMUSCULAR | Status: DC | PRN
  Administered 2023-05-16: 0.5 mg via INTRAVENOUS

## 2023-05-16 MED ORDER — BUPIVACAINE HCL (PF) 0.75 % IJ SOLN
INTRAMUSCULAR | Status: AC
  Filled 2023-05-16: qty 10

## 2023-05-16 MED ORDER — STERILE WATER FOR INJECTION IJ SOLN
INTRAMUSCULAR | Status: AC
  Filled 2023-05-16: qty 10

## 2023-05-16 MED ORDER — AMISULPRIDE (ANTIEMETIC) 5 MG/2ML IV SOLN: 10.0000 mg | Freq: Once | INTRAVENOUS | Status: DC | PRN

## 2023-05-16 MED ORDER — ONDANSETRON HCL 4 MG/2ML IJ SOLN
INTRAMUSCULAR | Status: DC | PRN
  Administered 2023-05-16: 4 mg via INTRAVENOUS

## 2023-05-16 MED ORDER — BSS PLUS IO SOLN
INTRAOCULAR | Status: AC
  Filled 2023-05-16: qty 500

## 2023-05-16 MED ORDER — EPINEPHRINE PF 1 MG/ML IJ SOLN
INTRAOCULAR | Status: DC | PRN
  Administered 2023-05-16: 500 mL

## 2023-05-16 MED ORDER — ATROPINE SULFATE 1 % OP SOLN
OPHTHALMIC | Status: AC
  Filled 2023-05-16: qty 5

## 2023-05-16 MED ORDER — BACITRACIN-POLYMYXIN B 500-10000 UNIT/GM OP OINT
TOPICAL_OINTMENT | OPHTHALMIC | Status: DC | PRN
  Administered 2023-05-16: 1 via OPHTHALMIC

## 2023-05-16 MED ORDER — TROPICAMIDE 1 % OP SOLN
1.0000 [drp] | OPHTHALMIC | Status: AC | PRN
  Administered 2023-05-16 (×3): 1 [drp] via OPHTHALMIC
  Filled 2023-05-16: qty 15

## 2023-05-16 MED ORDER — OXYCODONE HCL 5 MG PO TABS
5.0000 mg | ORAL_TABLET | Freq: Once | ORAL | Status: AC | PRN
  Administered 2023-05-16: 5 mg via ORAL

## 2023-05-16 MED ORDER — SODIUM CHLORIDE 0.9 % IV SOLN: INTRAVENOUS | Status: DC

## 2023-05-16 MED ORDER — MIDAZOLAM HCL 2 MG/2ML IJ SOLN
INTRAMUSCULAR | Status: DC | PRN
  Administered 2023-05-16: 2 mg via INTRAVENOUS

## 2023-05-16 MED ORDER — BSS IO SOLN
INTRAOCULAR | Status: DC | PRN
  Administered 2023-05-16: 15 mL via INTRAOCULAR

## 2023-05-16 MED ORDER — LIDOCAINE HCL 1 % IJ SOLN
INTRAMUSCULAR | Status: AC
  Filled 2023-05-16: qty 20

## 2023-05-16 SURGICAL SUPPLY — 61 items
APL SWBSTK 6 STRL LF DISP (MISCELLANEOUS) ×8
APPLICATOR COTTON TIP 6 STRL (MISCELLANEOUS) ×8 IMPLANT
APPLICATOR COTTON TIP 6IN STRL (MISCELLANEOUS) ×8
BAND WRIST GAS GREEN (MISCELLANEOUS) IMPLANT
BLADE EYE CATARACT 19 1.4 BEAV (BLADE) IMPLANT
BNDG EYE OVAL 2 1/8 X 2 5/8 (GAUZE/BANDAGES/DRESSINGS) ×2 IMPLANT
CABLE BIPOLOR RESECTION CORD (MISCELLANEOUS) ×2 IMPLANT
CANNULA ANT CHAM MAIN (OPHTHALMIC RELATED) IMPLANT
CANNULA DUALBORE 25G (CANNULA) ×2 IMPLANT
CANNULA FLEX TIP 25G (CANNULA) ×2 IMPLANT
CLSR STERI-STRIP ANTIMIC 1/2X4 (GAUZE/BANDAGES/DRESSINGS) ×2 IMPLANT
DRAPE INCISE 51X51 W/FILM STRL (DRAPES) ×2 IMPLANT
DRAPE MICROSCOPE LEICA 46X105 (MISCELLANEOUS) ×2 IMPLANT
DRAPE OPHTHALMIC 77X100 STRL (CUSTOM PROCEDURE TRAY) ×2 IMPLANT
FILTER BLUE MILLIPORE (MISCELLANEOUS) IMPLANT
FILTER STRAW FLUID ASPIR (MISCELLANEOUS) IMPLANT
FORCEPS GRIESHABER ILM 25G A (INSTRUMENTS) IMPLANT
FORCEPS GRIESHABER MAX 25G (MISCELLANEOUS) IMPLANT
GAS AUTO FILL CONSTEL (OPHTHALMIC)
GAS AUTO FILL CONSTELLATION (OPHTHALMIC) IMPLANT
GLOVE BIO SURGEON STRL SZ7.5 (GLOVE) ×4 IMPLANT
GLOVE BIOGEL M 7.0 STRL (GLOVE) ×2 IMPLANT
GOWN STRL REUS W/ TWL LRG LVL3 (GOWN DISPOSABLE) ×4 IMPLANT
GOWN STRL REUS W/ TWL XL LVL3 (GOWN DISPOSABLE) ×2 IMPLANT
GOWN STRL REUS W/TWL LRG LVL3 (GOWN DISPOSABLE) ×4
GOWN STRL REUS W/TWL XL LVL3 (GOWN DISPOSABLE) ×2
KIT BASIN OR (CUSTOM PROCEDURE TRAY) ×2 IMPLANT
KIT PERFLUORON PROCEDURE 5ML (MISCELLANEOUS) IMPLANT
LENS VITRECTOMY FLAT OCLR DISP (MISCELLANEOUS) IMPLANT
LOOP FINESSE 25 GA (MISCELLANEOUS) IMPLANT
MICROPICK 25G (MISCELLANEOUS)
NDL 18GX1X1/2 (RX/OR ONLY) (NEEDLE) ×4 IMPLANT
NDL 25GX 5/8IN NON SAFETY (NEEDLE) ×2 IMPLANT
NDL FILTER BLUNT 18X1 1/2 (NEEDLE) ×2 IMPLANT
NDL HYPO 30X.5 LL (NEEDLE) ×4 IMPLANT
NEEDLE 18GX1X1/2 (RX/OR ONLY) (NEEDLE) ×4 IMPLANT
NEEDLE 25GX 5/8IN NON SAFETY (NEEDLE) ×2 IMPLANT
NEEDLE FILTER BLUNT 18X1 1/2 (NEEDLE) ×2 IMPLANT
NEEDLE HYPO 30X.5 LL (NEEDLE) ×4 IMPLANT
NS IRRIG 1000ML POUR BTL (IV SOLUTION) ×2 IMPLANT
PACK VITRECTOMY CUSTOM (CUSTOM PROCEDURE TRAY) ×2 IMPLANT
PAD ARMBOARD 7.5X6 YLW CONV (MISCELLANEOUS) ×4 IMPLANT
PAK PIK VITRECTOMY CVS 25GA (OPHTHALMIC) ×2 IMPLANT
PENCIL BIPOLAR 25GA STR DISP (OPHTHALMIC RELATED) IMPLANT
PICK MICROPICK 25G (MISCELLANEOUS) IMPLANT
PROBE ENDO DIATHERMY 25G (MISCELLANEOUS) IMPLANT
PROBE LASER ILLUM FLEX CVD 25G (OPHTHALMIC) IMPLANT
REPL STRA BRUSH NDL (NEEDLE) IMPLANT
REPL STRA BRUSH NEEDLE (NEEDLE) IMPLANT
RESERVOIR BACK FLUSH (MISCELLANEOUS) IMPLANT
RETRACTOR IRIS FLEX 25G GRIESH (INSTRUMENTS) IMPLANT
SCISSORS TIP ADVANCED DSP 25GA (INSTRUMENTS) IMPLANT
SET INJECTOR OIL FLUID CONSTEL (OPHTHALMIC) IMPLANT
SHIELD EYE LENSE ONLY DISP (GAUZE/BANDAGES/DRESSINGS) IMPLANT
SUT VICRYL 7 0 TG140 8 (SUTURE) ×2 IMPLANT
SYR 10ML LL (SYRINGE) ×4 IMPLANT
SYR TB 1ML LUER SLIP (SYRINGE) ×4 IMPLANT
TOWEL GREEN STERILE FF (TOWEL DISPOSABLE) ×2 IMPLANT
TRAY FOLEY W/BAG SLVR 14FR (SET/KITS/TRAYS/PACK) IMPLANT
TUBING HIGH PRESS EXTEN 6IN (TUBING) ×2 IMPLANT
WATER STERILE IRR 1000ML POUR (IV SOLUTION) ×2 IMPLANT

## 2023-05-16 NOTE — Interval H&P Note (Signed)
History and Physical Interval Note:  05/16/2023 2:31 PM  Kimberly Richards  has presented today for surgery, with the diagnosis of LEFT eye retinal detachment.  The various methods of treatment have been discussed with the patient and family. After consideration of risks, benefits and other options for treatment, the patient has consented to  Procedure(s): PARS PLANA VITRECTOMY WITH 25 GAUGE, LASER AND GAS LEFT EYE (Left) PHOTOCOAGULATION WITH HEADSCOPE LASER RIGHT EYE (Right) as a surgical intervention.  The patient's history has been reviewed, patient examined, no change in status, stable for surgery.  I have reviewed the patient's chart and labs.  Questions were answered to the patient's satisfaction.     Kimberly Richards

## 2023-05-16 NOTE — Brief Op Note (Signed)
05/16/2023  5:40 PM  PATIENT:  Elige Ko  47 y.o. female  PRE-OPERATIVE DIAGNOSIS:  LEFT eye retinal detachment  POST-OPERATIVE DIAGNOSIS:  LEFT eye retinal detachment  PROCEDURE:  Procedure(s): PARS PLANA VITRECTOMY WITH 25 GAUGE (Left) INSERTION OF GAS (Left) PHOTOCOAGULATION WITH LASER (Left)  SURGEON:  Surgeons and Role:    [name], MD - Primary  ASSISTANTS: Laurian Brim, Ophthalmic Assistant    ANESTHESIA:   general  EBL:  1 mL   BLOOD ADMINISTERED:none  DRAINS: none   LOCAL MEDICATIONS USED:  NONE  SPECIMEN:  No Specimen  DISPOSITION OF SPECIMEN:  N/A  COUNTS:  YES  TOURNIQUET:  * No tourniquets in log *  DICTATION: .Note written in EPIC  PLAN OF CARE: Discharge to home after PACU  PATIENT DISPOSITION:  PACU - hemodynamically stable.   Delay start of Pharmacological VTE agent (>24hrs) due to surgical blood loss or risk of bleeding: not applicable

## 2023-05-16 NOTE — Anesthesia Preprocedure Evaluation (Addendum)
Anesthesia Evaluation  Patient identified by MRN, date of birth, ID band Patient awake    Reviewed: Allergy & Precautions, H&P , NPO status , Patient's Chart, lab work & pertinent test results  History of Anesthesia Complications (+) PONV, DIFFICULT IV STICK / SPECIAL LINE and history of anesthetic complications (pt denies)  Airway Mallampati: II  TM Distance: >3 FB Neck ROM: Full    Dental  (+) Teeth Intact, Dental Advisory Given   Pulmonary neg pulmonary ROS   Pulmonary exam normal breath sounds clear to auscultation       Cardiovascular negative cardio ROS Normal cardiovascular exam Rhythm:Regular Rate:Normal     Neuro/Psych  PSYCHIATRIC DISORDERS Anxiety Depression    negative neurological ROS     GI/Hepatic negative GI ROS, Neg liver ROS,,,  Endo/Other  BMI 32  Renal/GU negative Renal ROS  negative genitourinary   Musculoskeletal negative musculoskeletal ROS (+)    Abdominal  (+) + obese  Peds negative pediatric ROS (+)  Hematology negative hematology ROS (+)   Anesthesia Other Findings   Reproductive/Obstetrics negative OB ROS                             Anesthesia Physical Anesthesia Plan  ASA: 2  Anesthesia Plan: General   Post-op Pain Management: Tylenol PO (pre-op)*   Induction: Intravenous  PONV Risk Score and Plan: Midazolam, Treatment may vary due to age or medical condition, Ondansetron and Dexamethasone  Airway Management Planned: Oral ETT  Additional Equipment: None  Intra-op Plan:   Post-operative Plan: Extubation in OR  Informed Consent: I have reviewed the patients History and Physical, chart, labs and discussed the procedure including the risks, benefits and alternatives for the proposed anesthesia with the patient or authorized representative who has indicated his/her understanding and acceptance.     Dental advisory given  Plan Discussed with:  CRNA  Anesthesia Plan Comments: (Very difficult PIV placement-multiple attempts, minimal ultrasound targets )       Anesthesia Quick Evaluation

## 2023-05-16 NOTE — Anesthesia Procedure Notes (Signed)
Procedure Name: Intubation Date/Time: 05/16/2023 3:15 PM  Performed by: Vena Austria, CRNAPre-anesthesia Checklist: Patient identified, Emergency Drugs available, Suction available, Patient being monitored and Timeout performed Patient Re-evaluated:Patient Re-evaluated prior to induction Oxygen Delivery Method: Circle system utilized Preoxygenation: Pre-oxygenation with 100% oxygen Induction Type: IV induction Ventilation: Mask ventilation without difficulty Laryngoscope Size: Miller and 2 Grade View: Grade II Tube type: Oral Tube size: 7.0 mm Number of attempts: 1 Placement Confirmation: ETT inserted through vocal cords under direct vision, positive ETCO2, CO2 detector and breath sounds checked- equal and bilateral Secured at: 21 cm Tube secured with: Tape

## 2023-05-16 NOTE — Discharge Instructions (Signed)
POSTOPERATIVE INSTRUCTIONS  Your doctor has performed vitreoretinal surgery on you at [place].  - Keep eye patched and shielded until seen by Dr.  8 AM tomorrow in clinic - Do not use drops until return - FACE DOWN POSITIONING WHILE AWAKE - Sleep with belly down or on right side, avoid laying flat on back.    - No strenuous bending, stooping or lifting.  - You may not drive until further notice.  - If your doctor used a gas bubble in your eye during the procedure he will advise you on postoperative positioning. If you have a gas bubble you will be wearing a green bracelet that was applied in the operating room. The green bracelet should stay on as long as the gas bubble is in your eye. While the gas bubble is present you should not fly in an airplane. If you require general anesthesia while the gas bubble is present you must notify your anesthesiologist that an intraocular gas bubble is present so he can take the appropriate precautions.  - Tylenol or any other over-the-counter pain reliever can be used according to your doctor. If more pain medicine is required, your doctor will have a prescription for you.  - You may read, go up and down stairs, and watch television.      , M.D., Ph.D.  

## 2023-05-16 NOTE — Progress Notes (Signed)
[facility] - Clinic Note  05/17/2023   CHIEF COMPLAINT Patient presents for Retina Follow Up  HISTORY OF PRESENT ILLNESS: Kimberly Richards is a 47 y.o. female who presents to the clinic today for:  HPI     Retina Follow Up   In left eye.  Severity is moderate.  Duration of 1 day.  Since onset it is stable.  I, the attending physician,  performed the HPI with the patient and updated documentation appropriately.        Comments   Pt here for 1 day PO, RD OS s/p PPV OS on 10.9.24. Pt is stable, having some pain OS about 6/7 on pain scale. Reports taking otc tylenol, not much help. Was able to get some sleep.       Last edited by [name], MD on 05/17/2023  7:45 PM.    Pt states last night went well, she slept most of the night, she was able to sleep on her stomach with her face down   Referring physician: [place], [place], OD [address] [place],  [place] [address]  HISTORICAL INFORMATION:  Selected notes from the MEDICAL RECORD NUMBER Referred by Dr. Bascom Levels for concern of RD OS LEE:  Ocular Hx- PMH-   CURRENT MEDICATIONS: Current Outpatient Medications (Ophthalmic Drugs)  Medication Sig   prednisoLONE acetate (PRED FORTE) 1 % ophthalmic suspension Place 1 drop into the right eye 4 (four) times daily.   No current facility-administered medications for this visit. (Ophthalmic Drugs)   Current Outpatient Medications (Other)  Medication Sig   Azelaic Acid 15 % gel APPLY TOPICALLY TO AFFECTED AREA TWICE DAILY IN THE MORNING AND EVENING AFTER SKIN HAS BEEN THOROUGHLY WASHED AND PATTED DRY   B Complex-C-Folic Acid (HM VITAMIN B COMPLEX/VITAMIN C PO) Take 1 tablet by mouth 2 (two) times daily.   Digestive Enzymes (DIGESTIVE ENZYME PO) Take by mouth daily at 12 noon.   FLUoxetine (PROZAC) 10 MG tablet Take 1 tablet by mouth once daily   fluticasone (FLONASE) 50 MCG/ACT nasal spray Place 2 sprays into both nostrils daily.   magnesium  gluconate (MAGONATE) 500 MG tablet Take 500 mg by mouth at bedtime.   MELATONIN PO Take by mouth at bedtime. Free Sleep Aid   Multiple Vitamin (MULTIVITAMIN) tablet Take 1 tablet by mouth daily.   NON FORMULARY 2 (two) times daily. Vegan Omega 3   Probiotic Product (FORTIFY PROBIOTIC WOMENS EX ST PO) Take 1 tablet by mouth daily.   spironolactone (ALDACTONE) 50 MG tablet Take 1 tablet by mouth once daily   tretinoin (RETIN-A) 0.025 % cream Apply topically at bedtime.   VALERIAN PO Take by mouth.   vitamin E 180 MG (400 UNITS) capsule Take 400 Units by mouth daily.   clonazePAM (KLONOPIN) 0.5 MG tablet Take 0.5-1 tablets (0.25-0.5 mg total) by mouth 2 (two) times daily as needed for anxiety.   No current facility-administered medications for this visit. (Other)   REVIEW OF SYSTEMS: ROS   Positive for: Neurological, Skin, Eyes Negative for: Constitutional, Gastrointestinal, Genitourinary, Musculoskeletal, HENT, Endocrine, Cardiovascular, Respiratory, Psychiatric, Allergic/Imm, Heme/Lymph Last edited by Thompson Grayer, COT on 05/17/2023  8:10 AM.      ALLERGIES Allergies  Allergen Reactions   Stadol [Butorphanol]    Sulfa Antibiotics     Other reaction(s): gi distress   PAST MEDICAL HISTORY Past Medical History:  Diagnosis Date   Abnormal Pap smear of cervix    Acne  On Aldactone   Anemia    Anxiety with depression 12/18/2017   Cancer (HCC)    Constipation    H/O ovarian cancer    High cholesterol    Hot flashes    HPV (human papilloma virus) anogenital infection    Other hyperlipidemia 07/03/2022   Ovarian cyst    PONV (postoperative nausea and vomiting)    Primary hyperparathyroidism (HCC)    Past Surgical History:  Procedure Laterality Date   ABDOMINAL HYSTERECTOMY     BREAST BIOPSY Right    CESAREAN SECTION     x3   EYE SURGERY Right    office procedure of the right eye   GAS INSERTION Left 05/16/2023   Procedure: INSERTION OF GAS;  Surgeon: [name], MD;  Location: [facility] OR;  Service: Ophthalmology;  Laterality: Left;   LAPAROSCOPIC HYSTERECTOMY     total   LAPAROTOMY     Ovarian mass removed  12/06/2011   Borderline Ovarian Cancer   PARS PLANA VITRECTOMY Left 05/16/2023   Procedure: PARS PLANA VITRECTOMY WITH 25 GAUGE;  Surgeon: [name], MD;  Location: [facility] OR;  Service: Ophthalmology;  Laterality: Left;   PHOTOCOAGULATION WITH LASER Left 05/16/2023   Procedure: PHOTOCOAGULATION WITH LASER;  Surgeon: [name], MD;  Location: [facility] OR;  Service: Ophthalmology;  Laterality: Left;   SMALL INTESTINE SURGERY     FAMILY HISTORY Family History  Problem Relation Age of Onset   Hypertension Mother    Diabetes Mother    High blood pressure Mother    Depression Mother    Drug abuse Mother    Obesity Mother    Hypertension Father    Diabetes Father    High Cholesterol Father    Heart disease Father    Cancer Father    Drug abuse Father    Hypertension Maternal Grandmother    Diabetes Maternal Grandmother    Hypertension Maternal Grandfather    Diabetes Maternal Grandfather    Cancer Paternal Grandmother        breast   Hypertension Paternal Grandmother    Diabetes Paternal Grandmother    Hypertension Paternal Grandfather    Diabetes Paternal Grandfather    Rectal cancer Other    Stomach cancer Other    Esophageal cancer Other    Colon polyps Other    Colon cancer Other    SOCIAL HISTORY Social History   Tobacco Use   Smoking status: Never   Smokeless tobacco: Never  Vaping Use   Vaping status: Never Used  Substance Use Topics   Alcohol use: Yes    Comment: cocktails 3 x week   Drug use: No       OPHTHALMIC EXAM:  Base Eye Exam     Visual Acuity (Snellen - Linear)       Right Left   Dist cc 20/25 HM   Dist ph cc  NI         Tonometry (Tonopen, 8:17 AM)       Right Left   Pressure 13 16         Visual Fields       Left Right     Full   Restrictions Total superior temporal, inferior  temporal, superior nasal, inferior nasal deficiencies          Neuro/Psych     Oriented x3: Yes   Mood/Affect: Normal         Dilation     Both eyes: 1.0% Mydriacyl, 2.5% Phenylephrine @ 8:17  AM           Slit Lamp and Fundus Exam     Slit Lamp Exam       Right Left   Lids/Lashes Normal Normal   Conjunctiva/Sclera mild melanosis, Subconjunctival hemorrhage, Chemosis mild melanosis, Subconjunctival hemorrhage, sutures intact, pneumatic chemosis   Cornea arcus, trace tear film debris Mild arcus, 1+ Punctate epithelial erosions   Anterior Chamber deep and clear Deep, 2-3+cell, early fibrin reaction   Iris Round and dilated Round and dilated   Lens Clear 1-2+ PC feathering   Anterior Vitreous mild syneresis post vitrectomy, good gas fill         Fundus Exam       Right Left   Disc Pink and Sharp, mild PPP Pink and Sharp   C/D Ratio 0.6 0.6   Macula Flat, Good foveal reflex, RPE mottling, No heme or edema flat under gas, Good foveal reflex   Vessels Normal attenuated   Periphery Attached, patches of lattice degeneration at 1100 and from 0430-0600, smaller patches of lattice at 0300 and 0730 equator Attached, good laser surrounding large tears SN quad at 1000 and 1100, good 360 peripheral laser; PRE-OP: 2 large tears SN quad at 1000 and 1100 with +SRF; no other details visible elsewhere           IMAGING AND PROCEDURES  Imaging and Procedures for 05/17/2023         ASSESSMENT/PLAN:   ICD-10-CM   1. Left retinal detachment  H33.22     2. Vitreous hemorrhage of left eye (HCC)  H43.12     3. Lattice degeneration of right retina  H35.411     4. Retinal hole of right eye  H33.321      1,2. Rhegmatogenous retinal detachment with vitreous hemorrhage, OS  - pt reports progressive vision loss OS w/ onset on Saturday, 10.05.24, green blobs in vision  - denies photopsias  - vision became increasing blurry and presented to ED on Sunday, 10.06.24  - outpt follow  up with Dr. Bascom Levels on 10.07.24 - exam 10.8.24 w/ diffuse VH (no view of posterior pole), two large retinal tears at 1000 and 1100 (superonasal periphery) with +SRF - B-scan 10.8.24 ultrasound shows focal peripheral detachment in superonasal quad with tears at 1000 and 1100, matching exam - POD1 s/p PPV/PFO/EL/FAX/14% C3F8 OS, 10.09.2024             - doing well this morning             - retina attached and in good position -- good aser around breaks             - IOP good at 16             - start   PF 6x/day OS                          zymaxid QID OS                          Atropine BID OS                          PSO ung QID OS              - cont face down positioning x7 days; avoid laying flat on back              -  eye shield when sleeping x2 wks             - post op drop and positioning instructions reviewed              - tylenol/ibuprofen for pain   - f/u Friday, October 18th -- POV  3,4. Lattice degeneration w/ atrophic holes, right eye - patches of lattice at 11 and from 0430-0600, smaller patches of lattice at 0300 and 0730 equator - discussed findings, prognosis, and treatment options including observation - s/p retinopexy OD (10.08.24) - cont PF QID OD x7 days - f/u in 2-3 wks, sooner prn -- POV   Ophthalmic Meds Ordered this visit:  No orders of the defined types were placed in this encounter.    Return in about 8 days (around 05/25/2023) for f/u RD OS, DFE, OCT.  There are no Patient Instructions on file for this visit.  Explained the diagnoses, plan, and follow up with the patient and they expressed understanding.  Patient expressed understanding of the importance of proper follow up care.   This document serves as a record of services personally performed by Karie Chimera, MD, PhD. It was created on their behalf by Glee Arvin. Manson Passey, OA an ophthalmic technician. The creation of this record is the provider's dictation and/or activities during the visit.     Electronically signed by: Glee Arvin. Manson Passey, OA 05/17/23 7:45 PM   Karie Chimera, M.D., Ph.D. Diseases & Surgery of the Retina and Vitreous [facility] 05/17/2023  I have reviewed the above documentation for accuracy and completeness, and I agree with the above. Karie Chimera, M.D., Ph.D. 05/17/23 7:48 PM    Abbreviations: M myopia (nearsighted); A astigmatism; H hyperopia (farsighted); P presbyopia; Mrx spectacle prescription;  CTL contact lenses; OD right eye; OS left eye; OU both eyes  XT exotropia; ET esotropia; PEK punctate epithelial keratitis; PEE punctate epithelial erosions; DES dry eye syndrome; MGD meibomian gland dysfunction; ATs artificial tears; PFAT's preservative free artificial tears; NSC nuclear sclerotic cataract; PSC posterior subcapsular cataract; ERM epi-retinal membrane; PVD posterior vitreous detachment; RD retinal detachment; DM diabetes mellitus; DR diabetic retinopathy; NPDR non-proliferative diabetic retinopathy; PDR proliferative diabetic retinopathy; CSME clinically significant macular edema; DME diabetic macular edema; dbh dot blot hemorrhages; CWS cotton wool spot; POAG primary open angle glaucoma; C/D cup-to-disc ratio; HVF humphrey visual field; GVF goldmann visual field; OCT optical coherence tomography; IOP intraocular pressure; BRVO Branch retinal vein occlusion; CRVO central retinal vein occlusion; CRAO central retinal artery occlusion; BRAO branch retinal artery occlusion; RT retinal tear; SB scleral buckle; PPV pars plana vitrectomy; VH Vitreous hemorrhage; PRP panretinal laser photocoagulation; IVK intravitreal kenalog; VMT vitreomacular traction; MH Macular hole;  NVD neovascularization of the disc; NVE neovascularization elsewhere; AREDS age related eye disease study; ARMD age related macular degeneration; POAG primary open angle glaucoma; EBMD epithelial/anterior basement membrane dystrophy; ACIOL anterior chamber intraocular lens;  IOL intraocular lens; PCIOL posterior chamber intraocular lens; Phaco/IOL phacoemulsification with intraocular lens placement; PRK photorefractive keratectomy; LASIK laser assisted in situ keratomileusis; HTN hypertension; DM diabetes mellitus; COPD chronic obstructive pulmonary disease

## 2023-05-16 NOTE — Op Note (Signed)
Date of procedure: 10.09.24   Surgeon: [name], MD, PhD   Assistant: Laurian Brim, Ophthalmic Assistant    Pre-operative Diagnoses:  1. Vitreous hemorrhage, Left Eye 2. Rhegmatogenous Retinal Detachment, Left Eye   Post-operative diagnosis: 1. Vitreous hemorrhage, Left Eye 2. Rhegmatogenous Retinal Detachment, Left Eye   Anesthesia: GETA   Procedures: 1.     25 gauge pars plana vitrectomy, Left Eye CPT [phone] 2.     Perfluorocarbon injection 3.     Fluid-air exchange, Left Eye 4.     Endolaser, Left Eye 5.     Injection of 14% C3F8 gas   Complications: none Estimated blood loss: minimal Specimens: none   Brief history:   The patient has a history of decreased vision in the affected left eye, and on examination, was noted to have a diffuse vitreous hemorrhage and a superior retinal detachment, affecting activities of daily living.  The risks, benefits, and alternatives to surgery were explained to the patient, including pain, bleeding, infection, loss of vision, double vision, droopy eyelids, and need for more surgeries.  Informed consent was obtained from the patient and placed in the chart.     Procedure:             The patient was brought to the preoperative holding area where the correct eye was confirmed and marked. The patient was then brought to the operating room where general endotracheal anesthesia was induced. A secondary time-out was performed to identify the correct patient, eyes, procedures, and any allergies. The left eye was prepped and draped in the usual sterile ophthalmic fashion followed by placement of a lid speculum. First, a 25 gauge trocar was placed in the inferotemporal quadrant in a beveled fashion, 4 mm posterior to the limbus in this phakic patient. A 4 mm infusion cannula was placed through this trocar, and the infusion cannula was confirmed in the vitreous cavity with no incarceration of retina or choroid prior to turning it on. Two additional 25  gauge trocars were placed in the superonasal and superotemporal quadrants (2 and 10 oclock, respectively) in a similar beveled fashion. At this time, a standard three-port pars plana vitrectomy was performed using the light pipe, the cutter, and the BIOM viewing system. Of note, visualization of the posterior pole was initially obscured by a dense vitreous hemorrhage. A thorough anterior, core and peripheral vitreous dissection was performed, which allowed full visualization of the posterior pole. There were two large and irregular retinal tears in the superonasal quadrant at 10 and 11 oclock with subretinal fluid spanning 9 to 1 oclock superiorly and tracking posteriorly to just outside the superotemporal arcades, leaving the fovea attached. A posterior vitreous detachment was confirmed over the optic nerve.             Traction was removed from all retinal breaks. The breaks were trimmed using the cutter to smooth the edges and marked with diathermy. Perfluoron was injected to push the subretinal fluid anteriorly. Under perfluoron, endolaser was applied around the flattened retinal breaks. Then, a complete fluid-air exchange was performed with a soft tip extrusion cannula over the breaks, then posteriorly to remove the perfluoron. After completion of these maneuvers, the retina was flat over the macula and around the breaks. Under air, endolaser was applied to all the breaks and 360 degrees, posterior to ora. Of note, there was a large area of lattice degeneration w/ vitreous traction and atrophic holes in the inferior periphery, which we surrounded with barricade retinopexy via endolaser.  At this time, the superotemporal trocar was removed and sutured with 7-0 vicryl in an interrupted fashion.  A complete air to 14% C3F8 gas exchange was performed through the infusion cannula and vented through the superonasal trocar using the extrusion cannula. The superonasal trocar and infusion cannula and  associated trocar were then removed and sutured with 7-0 vicryl in an interrupted fashion. The eye's intraocular pressure was confirmed to be at a physiologic level by digital palpation. Subconjunctival injections of Antibiotic and kenalog were administered. The lid speculum and drapes were removed. Drops of an antibiotic, antihypertensives, and steroid were given. Copious antibiotic ointment was instilled into the eye. The eye was patched and shielded. The patient tolerated the procedure well without any intraoperative or immediate postoperative complications. The patient was taken to the recovery room in good condition. The patient was instructed to maintain a strict face-down position and will be seen by Dr. Vanessa Barbara tomorrow morning in clinic.

## 2023-05-16 NOTE — Transfer of Care (Signed)
Immediate Anesthesia Transfer of Care Note  Patient: Kimberly Richards  Procedure(s) Performed: PARS PLANA VITRECTOMY WITH 25 GAUGE (Left) INSERTION OF GAS (Left: Eye) PHOTOCOAGULATION WITH LASER (Left)  Patient Location: PACU  Anesthesia Type:General  Level of Consciousness: awake, drowsy, and patient cooperative  Airway & Oxygen Therapy: Patient Spontanous Breathing and Patient connected to face mask oxygen  Post-op Assessment: Report given to RN and Post -op Vital signs reviewed and stable  Post vital signs: Reviewed and stable  Last Vitals:  Vitals Value Taken Time  BP 126/81 05/16/23 1752  Temp 36.4 C 05/16/23 1750  Pulse 74 05/16/23 1757  Resp 15 05/16/23 1758  SpO2 100 % 05/16/23 1757  Vitals shown include unfiled device data.  Last Pain:  Vitals:   05/16/23 1750  TempSrc:   PainSc: Asleep         Complications: No notable events documented.

## 2023-05-17 ENCOUNTER — Ambulatory Visit (INDEPENDENT_AMBULATORY_CARE_PROVIDER_SITE_OTHER): Payer: BC Managed Care – PPO | Admitting: Ophthalmology

## 2023-05-17 ENCOUNTER — Encounter: Payer: Self-pay | Admitting: Family Medicine

## 2023-05-17 ENCOUNTER — Encounter (INDEPENDENT_AMBULATORY_CARE_PROVIDER_SITE_OTHER): Payer: Self-pay | Admitting: Ophthalmology

## 2023-05-17 ENCOUNTER — Other Ambulatory Visit: Payer: Self-pay | Admitting: Family Medicine

## 2023-05-17 DIAGNOSIS — H35411 Lattice degeneration of retina, right eye: Secondary | ICD-10-CM

## 2023-05-17 DIAGNOSIS — F418 Other specified anxiety disorders: Secondary | ICD-10-CM

## 2023-05-17 DIAGNOSIS — H4312 Vitreous hemorrhage, left eye: Secondary | ICD-10-CM

## 2023-05-17 DIAGNOSIS — H33321 Round hole, right eye: Secondary | ICD-10-CM

## 2023-05-17 DIAGNOSIS — H3322 Serous retinal detachment, left eye: Secondary | ICD-10-CM

## 2023-05-17 MED ORDER — CLONAZEPAM 0.5 MG PO TABS: 0.2500 mg | ORAL_TABLET | Freq: Two times a day (BID) | ORAL | 1 refills | Status: DC | PRN

## 2023-05-17 NOTE — Telephone Encounter (Signed)
Requesting: clonazepam 0.5mg   Contract: None UDS: None Last Visit: 04/11/23 Next Visit: 10/09/23 Last Refill: 10/12/22 #30 and 1RF   Please Advise

## 2023-05-17 NOTE — Anesthesia Postprocedure Evaluation (Signed)
Anesthesia Post Note  Patient: Kimberly Richards  Procedure(s) Performed: PARS PLANA VITRECTOMY WITH 25 GAUGE (Left) INSERTION OF GAS (Left: Eye) PHOTOCOAGULATION WITH LASER (Left)     Patient location during evaluation: PACU Anesthesia Type: General Level of consciousness: awake and alert Pain management: pain level controlled Vital Signs Assessment: post-procedure vital signs reviewed and stable Respiratory status: spontaneous breathing, nonlabored ventilation, respiratory function stable and patient connected to nasal cannula oxygen Cardiovascular status: blood pressure returned to baseline and stable Postop Assessment: no apparent nausea or vomiting Anesthetic complications: no  No notable events documented.  Last Vitals:  Vitals:   05/16/23 1830 05/16/23 1845  BP: 107/64 109/69  Pulse: 73 67  Resp: 12 10  Temp:  36.6 C  SpO2: 95% 100%    Last Pain:  Vitals:   05/16/23 1845  TempSrc:   PainSc: Asleep                 Ines Rebel L Taraji Mungo

## 2023-05-23 NOTE — Progress Notes (Signed)
[facility] - Clinic Note  05/25/2023   CHIEF COMPLAINT Patient presents for Retina Follow Up  HISTORY OF PRESENT ILLNESS: Kimberly Richards is a 47 y.o. female who presents to the clinic today for:  HPI     Retina Follow Up   Patient presents with  Retinal Break/Detachment.  In left eye.  This started 8 days ago.  Duration of 8 days.  Since onset it is stable.  I, the attending physician,  performed the HPI with the patient and updated documentation appropriately.        Comments   8 day retina follow up RD OS pt is reporting vision is about the same she is having some floaters but denies any flashes  PF 6x/day OS zymaxid QID OS Atropine BID OS  PSO ung QID OS       Last edited by [name], MD on 05/28/2023  7:56 PM.     Pt states she is remaining face down most of the day, she is sleeping face down, she states she is light sensitive and atropine burns when she puts it in   Referring physician: Fraz[place], [place], OD [address] [place],  [place] [address]  HISTORICAL INFORMATION:  Selected notes from the MEDICAL RECORD NUMBER Referred by Dr. Bascom Levels for concern of RD OS LEE:  Ocular Hx- PMH-   CURRENT MEDICATIONS: Current Outpatient Medications (Ophthalmic Drugs)  Medication Sig   bacitracin-polymyxin b (POLYSPORIN) ophthalmic ointment Place into the right eye at bedtime as needed for up to 10 days. Place a 1/2 inch ribbon of ointment into the lower eyelid.   prednisoLONE acetate (PRED FORTE) 1 % ophthalmic suspension Place 1 drop into the right eye 4 (four) times daily.   No current facility-administered medications for this visit. (Ophthalmic Drugs)   Current Outpatient Medications (Other)  Medication Sig   Azelaic Acid 15 % gel APPLY TOPICALLY TO AFFECTED AREA TWICE DAILY IN THE MORNING AND EVENING AFTER SKIN HAS BEEN THOROUGHLY WASHED AND PATTED DRY   B Complex-C-Folic Acid (HM VITAMIN B COMPLEX/VITAMIN C PO) Take 1 tablet by  mouth 2 (two) times daily.   clonazePAM (KLONOPIN) 0.5 MG tablet Take 0.5-1 tablets (0.25-0.5 mg total) by mouth 2 (two) times daily as needed for anxiety.   Digestive Enzymes (DIGESTIVE ENZYME PO) Take by mouth daily at 12 noon.   FLUoxetine (PROZAC) 10 MG tablet Take 1 tablet by mouth once daily   fluticasone (FLONASE) 50 MCG/ACT nasal spray Place 2 sprays into both nostrils daily.   magnesium gluconate (MAGONATE) 500 MG tablet Take 500 mg by mouth at bedtime.   MELATONIN PO Take by mouth at bedtime. Free Sleep Aid   Multiple Vitamin (MULTIVITAMIN) tablet Take 1 tablet by mouth daily.   NON FORMULARY 2 (two) times daily. Vegan Omega 3   Probiotic Product (FORTIFY PROBIOTIC WOMENS EX ST PO) Take 1 tablet by mouth daily.   spironolactone (ALDACTONE) 50 MG tablet Take 1 tablet by mouth once daily   tretinoin (RETIN-A) 0.025 % cream Apply topically at bedtime.   VALERIAN PO Take by mouth.   vitamin E 180 MG (400 UNITS) capsule Take 400 Units by mouth daily.   No current facility-administered medications for this visit. (Other)   REVIEW OF SYSTEMS: ROS   Positive for: Neurological, Skin, Eyes Negative for: Constitutional, Gastrointestinal, Genitourinary, Musculoskeletal, HENT, Endocrine, Cardiovascular, Respiratory, Psychiatric, Allergic/Imm, Heme/Lymph Last edited by Etheleen Mayhew, COT on 05/25/2023  7:48 AM.  ALLERGIES Allergies  Allergen Reactions   Stadol [Butorphanol]    Sulfa Antibiotics     Other reaction(s): gi distress   PAST MEDICAL HISTORY Past Medical History:  Diagnosis Date   Abnormal Pap smear of cervix    Acne    On Aldactone   Anemia    Anxiety with depression 12/18/2017   Cancer (HCC)    Constipation    H/O ovarian cancer    High cholesterol    Hot flashes    HPV (human papilloma virus) anogenital infection    Other hyperlipidemia 07/03/2022   Ovarian cyst    PONV (postoperative nausea and vomiting)    Primary hyperparathyroidism (HCC)     Past Surgical History:  Procedure Laterality Date   ABDOMINAL HYSTERECTOMY     BREAST BIOPSY Right    CESAREAN SECTION     x3   EYE SURGERY Right    office procedure of the right eye   GAS INSERTION Left 05/16/2023   Procedure: INSERTION OF GAS;  Surgeon: [name], MD;  Location: [facility] OR;  Service: Ophthalmology;  Laterality: Left;   LAPAROSCOPIC HYSTERECTOMY     total   LAPAROTOMY     Ovarian mass removed  12/06/2011   Borderline Ovarian Cancer   PARS PLANA VITRECTOMY Left 05/16/2023   Procedure: PARS PLANA VITRECTOMY WITH 25 GAUGE;  Surgeon: [name], MD;  Location: [facility] OR;  Service: Ophthalmology;  Laterality: Left;   PHOTOCOAGULATION WITH LASER Left 05/16/2023   Procedure: PHOTOCOAGULATION WITH LASER;  Surgeon: [name], MD;  Location: [facility] OR;  Service: Ophthalmology;  Laterality: Left;   SMALL INTESTINE SURGERY     FAMILY HISTORY Family History  Problem Relation Age of Onset   Hypertension Mother    Diabetes Mother    High blood pressure Mother    Depression Mother    Drug abuse Mother    Obesity Mother    Hypertension Father    Diabetes Father    High Cholesterol Father    Heart disease Father    Cancer Father    Drug abuse Father    Hypertension Maternal Grandmother    Diabetes Maternal Grandmother    Hypertension Maternal Grandfather    Diabetes Maternal Grandfather    Cancer Paternal Grandmother        breast   Hypertension Paternal Grandmother    Diabetes Paternal Grandmother    Hypertension Paternal Grandfather    Diabetes Paternal Grandfather    Rectal cancer Other    Stomach cancer Other    Esophageal cancer Other    Colon polyps Other    Colon cancer Other    SOCIAL HISTORY Social History   Tobacco Use   Smoking status: Never   Smokeless tobacco: Never  Vaping Use   Vaping status: Never Used  Substance Use Topics   Alcohol use: Yes    Comment: cocktails 3 x week   Drug use: No       OPHTHALMIC EXAM:  Base Eye Exam      Visual Acuity (Snellen - Linear)       Right Left   Dist cc 20/25 CF at 3'   Dist ph cc NI NI    Correction: Glasses         Tonometry (Tonopen, 7:52 AM)       Right Left   Pressure 14 10         Pupils       Pupils Dark Light Shape React APD   Right  PERRL 3 2 Round Brisk None   Left PERRL 5 5 Round NR None         Visual Fields       Left Right     Full   Restrictions Total superior temporal, inferior temporal, superior nasal, inferior nasal deficiencies          Neuro/Psych     Oriented x3: Yes   Mood/Affect: Normal         Dilation     Both eyes: 2.5% Phenylephrine @ 7:51 AM           Slit Lamp and Fundus Exam     Slit Lamp Exam       Right Left   Lids/Lashes Normal Normal   Conjunctiva/Sclera mild melanosis, Subconjunctival hemorrhage, Chemosis mild melanosis, Subconjunctival hemorrhage -- improving, sutures intact   Cornea arcus, trace tear film debris Mild arcus, 1+ Punctate epithelial erosions   Anterior Chamber deep and clear Deep, 1-2+fine cell, fibrin reaction -- resolved   Iris Round and dilated Round and dilated   Lens Clear 1-2+ PC feathering   Anterior Vitreous mild syneresis post vitrectomy, 90-95% gas fill         Fundus Exam       Right Left   Disc Pink and Sharp, mild PPP Pink and Sharp   C/D Ratio 0.6 0.3   Macula Flat, Good foveal reflex, RPE mottling, No heme or edema flat under gas, Good foveal reflex   Vessels Normal attenuated   Periphery Attached, patches of lattice degeneration at 1100 and from 0430-0600, smaller patches of lattice at 0300 and 0730 equator -- good early laser changes surrounding all lesions, no new RT/RD or lattice Attached, good laser surrounding large tears SN quad at 1000 and 1100, good 360 peripheral laser; PRE-OP: 2 large tears SN quad at 1000 and 1100 with +SRF; no other details visible elsewhere           Refraction     Wearing Rx       Sphere Cylinder Axis Add   Right -6.50  +4.75 094 +2.00   Left -6.25 +4.00 091 +2.00           IMAGING AND PROCEDURES  Imaging and Procedures for 05/25/2023          ASSESSMENT/PLAN:   ICD-10-CM   1. Left retinal detachment  H33.22 CANCELED: OCT, Retina - OU - Both Eyes    2. Vitreous hemorrhage of left eye (HCC)  H43.12     3. 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 Rhegmatogenous retinal detachment with vitreous hemorrhage, OS  - pt reports progressive vision loss OS w/ onset on Saturday, 10.05.24, green blobs in vision  - denies photopsias  - vision became increasing blurry and presented to ED on Sunday, 10.06.24  - outpt follow up with Dr. Bascom Levels on 10.07.24 - exam 10.8.24 w/ diffuse VH (no view of posterior pole), two large retinal tears at 1000 and 1100 (superonasal periphery) with +SRF - B-scan 10.8.24 ultrasound shows focal peripheral detachment in superonasal quad with tears at 1000 and 1100, matching exam - POW1 s/p PPV/PFO/EL/FAX/14% C3F8 OS, 10.09.2024             - doing well              - retina attached and in good position -- good laser around breaks             - IOP  OS 10             - cont   PF 6x/day OS -- decrease to QID                         zymaxid QID OS -- stop on Sunday, October 20                         Atropine BID OS                          PSO ung QID OS -- decrease to QID             - cont face down positioning 100m/hr; avoid laying flat on back              - eye shield when sleeping x1 more wk             - post op drop and positioning instructions reviewed              - tylenol/ibuprofen for pain   - f/u 2-3 weeks -- POV  3,4. Lattice degeneration w/ atrophic holes, right eye - patches of lattice at 11 and from 0430-0600, smaller patches of lattice at 0300 and 0730 equator - discussed findings, prognosis, and treatment options including observation - s/p retinopexy OD (10.08.24) - completed PF QID OD x7 days - f/u in 2-3 wks, sooner prn --  POV   Ophthalmic Meds Ordered this visit:  Meds ordered this encounter  Medications   bacitracin-polymyxin b (POLYSPORIN) ophthalmic ointment    Sig: Place into the right eye at bedtime as needed for up to 10 days. Place a 1/2 inch ribbon of ointment into the lower eyelid.    Dispense:  3.5 g    Refill:  5     Return for f/u 2-3 weeks, RD OS, DFE, OCT.  There are no Patient Instructions on file for this visit. This document serves as a record of services personally performed by Karie Chimera, MD, PhD. It was created on their behalf by Berlin Hun COT, an ophthalmic technician. The creation of this record is the provider's dictation and/or activities during the visit.    Electronically signed by: Berlin Hun COT 10.16.24 7:57 PM  This document serves as a record of services personally performed by Karie Chimera, MD, PhD. It was created on their behalf by Glee Arvin. Manson Passey, OA an ophthalmic technician. The creation of this record is the provider's dictation and/or activities during the visit.    Electronically signed by: Glee Arvin. Manson Passey, OA 05/28/23 7:57 PM  Karie Chimera, M.D., Ph.D. Diseases & Surgery of the Retina and Vitreous [facility] 05/25/2023   I have reviewed the above documentation for accuracy and completeness, and I agree with the above. Karie Chimera, M.D., Ph.D. 05/28/23 8:00 PM   Abbreviations: M myopia (nearsighted); A astigmatism; H hyperopia (farsighted); P presbyopia; Mrx spectacle prescription;  CTL contact lenses; OD right eye; OS left eye; OU both eyes  XT exotropia; ET esotropia; PEK punctate epithelial keratitis; PEE punctate epithelial erosions; DES dry eye syndrome; MGD meibomian gland dysfunction; ATs artificial tears; PFAT's preservative free artificial tears; NSC nuclear sclerotic cataract; PSC posterior subcapsular cataract; ERM epi-retinal membrane; PVD posterior vitreous detachment; RD retinal detachment; DM  diabetes mellitus; DR diabetic retinopathy; NPDR non-proliferative diabetic retinopathy; PDR  proliferative diabetic retinopathy; CSME clinically significant macular edema; DME diabetic macular edema; dbh dot blot hemorrhages; CWS cotton wool spot; POAG primary open angle glaucoma; C/D cup-to-disc ratio; HVF humphrey visual field; GVF goldmann visual field; OCT optical coherence tomography; IOP intraocular pressure; BRVO Branch retinal vein occlusion; CRVO central retinal vein occlusion; CRAO central retinal artery occlusion; BRAO branch retinal artery occlusion; RT retinal tear; SB scleral buckle; PPV pars plana vitrectomy; VH Vitreous hemorrhage; PRP panretinal laser photocoagulation; IVK intravitreal kenalog; VMT vitreomacular traction; MH Macular hole;  NVD neovascularization of the disc; NVE neovascularization elsewhere; AREDS age related eye disease study; ARMD age related macular degeneration; POAG primary open angle glaucoma; EBMD epithelial/anterior basement membrane dystrophy; ACIOL anterior chamber intraocular lens; IOL intraocular lens; PCIOL posterior chamber intraocular lens; Phaco/IOL phacoemulsification with intraocular lens placement; PRK photorefractive keratectomy; LASIK laser assisted in situ keratomileusis; HTN hypertension; DM diabetes mellitus; COPD chronic obstructive pulmonary disease

## 2023-05-25 ENCOUNTER — Other Ambulatory Visit: Payer: BC Managed Care – PPO

## 2023-05-25 ENCOUNTER — Encounter (INDEPENDENT_AMBULATORY_CARE_PROVIDER_SITE_OTHER): Payer: Self-pay | Admitting: Ophthalmology

## 2023-05-25 ENCOUNTER — Ambulatory Visit (INDEPENDENT_AMBULATORY_CARE_PROVIDER_SITE_OTHER): Payer: BC Managed Care – PPO | Admitting: Ophthalmology

## 2023-05-25 DIAGNOSIS — H3322 Serous retinal detachment, left eye: Secondary | ICD-10-CM

## 2023-05-25 DIAGNOSIS — H35411 Lattice degeneration of retina, right eye: Secondary | ICD-10-CM

## 2023-05-25 DIAGNOSIS — H4312 Vitreous hemorrhage, left eye: Secondary | ICD-10-CM

## 2023-05-25 MED ORDER — BACITRACIN-POLYMYXIN B 500-10000 UNIT/GM OP OINT: TOPICAL_OINTMENT | Freq: Every evening | OPHTHALMIC | 5 refills | Status: AC | PRN

## 2023-05-28 ENCOUNTER — Encounter (INDEPENDENT_AMBULATORY_CARE_PROVIDER_SITE_OTHER): Payer: Self-pay | Admitting: Ophthalmology

## 2023-06-14 NOTE — Progress Notes (Signed)
[facility] - Clinic Note  06/15/2023   CHIEF COMPLAINT Patient presents for Retina Follow Up  HISTORY OF PRESENT ILLNESS: Kimberly Richards is a 47 y.o. female who presents to the clinic today for:  HPI     Retina Follow Up   Patient presents with  Retinal Break/Detachment.  In left eye.  Severity is moderate.  Duration of 3 weeks.  Since onset it is stable.  I, the attending physician,  performed the HPI with the patient and updated documentation appropriately.        Comments   Pt here for 3 wk ret f/u RD OS. Pt states VA is improving, can see the edge of the gas bubble now. Pt reports doing the PF QID OS and the PSO ung PRN.       Last edited by [name], MD on 06/15/2023  8:54 AM.    Pt states the gas bubble is shrinking, she is still taking it easy, she is using PF QID and PSO Ung as needed, she is sleeping with her face down   Referring physician: Fraz[place], [place], OD [address] [place],  [place] [address]  HISTORICAL INFORMATION:  Selected notes from the MEDICAL RECORD NUMBER Referred by Dr. Bascom Levels for concern of RD OS LEE:  Ocular Hx- PMH-   CURRENT MEDICATIONS: Current Outpatient Medications (Ophthalmic Drugs)  Medication Sig   prednisoLONE acetate (PRED FORTE) 1 % ophthalmic suspension Place 1 drop into the right eye 4 (four) times daily.   No current facility-administered medications for this visit. (Ophthalmic Drugs)   Current Outpatient Medications (Other)  Medication Sig   Azelaic Acid 15 % gel APPLY TOPICALLY TO AFFECTED AREA TWICE DAILY IN THE MORNING AND EVENING AFTER SKIN HAS BEEN THOROUGHLY WASHED AND PATTED DRY   B Complex-C-Folic Acid (HM VITAMIN B COMPLEX/VITAMIN C PO) Take 1 tablet by mouth 2 (two) times daily.   clonazePAM (KLONOPIN) 0.5 MG tablet Take 0.5-1 tablets (0.25-0.5 mg total) by mouth 2 (two) times daily as needed for anxiety.   Digestive Enzymes (DIGESTIVE ENZYME PO) Take by mouth daily at 12 noon.    FLUoxetine (PROZAC) 10 MG tablet Take 1 tablet by mouth once daily   fluticasone (FLONASE) 50 MCG/ACT nasal spray Place 2 sprays into both nostrils daily.   magnesium gluconate (MAGONATE) 500 MG tablet Take 500 mg by mouth at bedtime.   MELATONIN PO Take by mouth at bedtime. Free Sleep Aid   Multiple Vitamin (MULTIVITAMIN) tablet Take 1 tablet by mouth daily.   NON FORMULARY 2 (two) times daily. Vegan Omega 3   Probiotic Product (FORTIFY PROBIOTIC WOMENS EX ST PO) Take 1 tablet by mouth daily.   spironolactone (ALDACTONE) 50 MG tablet Take 1 tablet by mouth once daily   tretinoin (RETIN-A) 0.025 % cream Apply topically at bedtime.   VALERIAN PO Take by mouth.   vitamin E 180 MG (400 UNITS) capsule Take 400 Units by mouth daily.   No current facility-administered medications for this visit. (Other)   REVIEW OF SYSTEMS: ROS   Positive for: Neurological, Skin, Eyes Negative for: Constitutional, Gastrointestinal, Genitourinary, Musculoskeletal, HENT, Endocrine, Cardiovascular, Respiratory, Psychiatric, Allergic/Imm, Heme/Lymph Last edited by Thompson Grayer, COT on 06/15/2023  7:53 AM.        ALLERGIES Allergies  Allergen Reactions   Stadol [Butorphanol]    Sulfa Antibiotics     Other reaction(s): gi distress   PAST MEDICAL HISTORY Past Medical History:  Diagnosis Date  Abnormal Pap smear of cervix    Acne    On Aldactone   Anemia    Anxiety with depression 12/18/2017   Cancer (HCC)    Constipation    H/O ovarian cancer    High cholesterol    Hot flashes    HPV (human papilloma virus) anogenital infection    Other hyperlipidemia 07/03/2022   Ovarian cyst    PONV (postoperative nausea and vomiting)    Primary hyperparathyroidism (HCC)    Past Surgical History:  Procedure Laterality Date   ABDOMINAL HYSTERECTOMY     BREAST BIOPSY Right    CESAREAN SECTION     x3   EYE SURGERY Right    office procedure of the right eye   GAS INSERTION Left 05/16/2023    Procedure: INSERTION OF GAS;  Surgeon: [name], MD;  Location: [facility] OR;  Service: Ophthalmology;  Laterality: Left;   LAPAROSCOPIC HYSTERECTOMY     total   LAPAROTOMY     Ovarian mass removed  12/06/2011   Borderline Ovarian Cancer   PARS PLANA VITRECTOMY Left 05/16/2023   Procedure: PARS PLANA VITRECTOMY WITH 25 GAUGE;  Surgeon: [name], MD;  Location: [facility] OR;  Service: Ophthalmology;  Laterality: Left;   PHOTOCOAGULATION WITH LASER Left 05/16/2023   Procedure: PHOTOCOAGULATION WITH LASER;  Surgeon: [name], MD;  Location: [facility] OR;  Service: Ophthalmology;  Laterality: Left;   SMALL INTESTINE SURGERY     FAMILY HISTORY Family History  Problem Relation Age of Onset   Hypertension Mother    Diabetes Mother    High blood pressure Mother    Depression Mother    Drug abuse Mother    Obesity Mother    Hypertension Father    Diabetes Father    High Cholesterol Father    Heart disease Father    Cancer Father    Drug abuse Father    Hypertension Maternal Grandmother    Diabetes Maternal Grandmother    Hypertension Maternal Grandfather    Diabetes Maternal Grandfather    Cancer Paternal Grandmother        breast   Hypertension Paternal Grandmother    Diabetes Paternal Grandmother    Hypertension Paternal Grandfather    Diabetes Paternal Grandfather    Rectal cancer Other    Stomach cancer Other    Esophageal cancer Other    Colon polyps Other    Colon cancer Other    SOCIAL HISTORY Social History   Tobacco Use   Smoking status: Never   Smokeless tobacco: Never  Vaping Use   Vaping status: Never Used  Substance Use Topics   Alcohol use: Yes    Comment: cocktails 3 x week   Drug use: No       OPHTHALMIC EXAM:  Base Eye Exam     Visual Acuity (Snellen - Linear)       Right Left   Dist cc 20/25 20/100   Dist ph cc  20/50 -2    Correction: Glasses         Tonometry (Tonopen, 7:58 AM)       Right Left   Pressure 14 11         Pupils        Dark Light Shape React APD   Right 3 2 Round Brisk None   Left 6 6 Round NR None         Visual Fields (Counting fingers)       Left Right  Full   Restrictions Total inferior temporal, inferior nasal deficiencies          Extraocular Movement       Right Left    Full, Ortho Full, Ortho         Neuro/Psych     Oriented x3: Yes   Mood/Affect: Normal         Dilation     Both eyes: 1.0% Mydriacyl, 2.5% Phenylephrine @ 7:59 AM           Slit Lamp and Fundus Exam     Slit Lamp Exam       Right Left   Lids/Lashes Normal Normal   Conjunctiva/Sclera mild melanosis, Subconjunctival hemorrhage, Chemosis mild melanosis, sutures dissolving, Subconjunctival hemorrhage -- improved   Cornea arcus, trace tear film debris Mild arcus, trace tear film debris   Anterior Chamber deep and clear Deep, 1+fine cell, fibrin reaction -- resolved   Iris Round and dilated Round and dilated   Lens Clear 1+ Cortical cataract, 1-2+ Posterior subcapsular cataract   Anterior Vitreous mild syneresis post vitrectomy, 40-45% gas fill         Fundus Exam       Right Left   Disc Pink and Sharp, mild PPP Pink and Sharp   C/D Ratio 0.6 0.3   Macula Flat, Good foveal reflex, RPE mottling, No heme or edema Flat, Good foveal reflex, No heme or edema   Vessels Normal mild attenuation, mild tortuosity   Periphery Attached, patches of lattice degeneration at 1100 and from 0430-0600, smaller patches of lattice at 0300 and 0730 equator -- good early laser changes surrounding all lesions, no new RT/RD or lattice Attached, good laser surrounding large tears SN quad at 1000 and 1100, good 360 peripheral laser; PRE-OP: 2 large tears SN quad at 1000 and 1100 with +SRF; no other details visible elsewhere           Refraction     Wearing Rx       Sphere Cylinder Axis Add   Right -6.50 +4.75 094 +2.00   Left -6.25 +4.00 091 +2.00           IMAGING AND PROCEDURES  Imaging and Procedures  for 06/15/2023  OCT, Retina - OU - Both Eyes       Right Eye Quality was good. Central Foveal Thickness: 271. Progression has been stable. Findings include normal foveal contour, no IRF, no SRF, vitreomacular adhesion .   Left Eye Quality was good. Central Foveal Thickness: 265. Progression has improved. Findings include normal foveal contour, no IRF, no SRF (SN retina re-attached).   Notes *Images captured and stored on drive  Diagnosis / Impression:  OF: NFP, no IRF/SRF OS: SN retina re-attached  Clinical management:  See below  Abbreviations: NFP - Normal foveal profile. CME - cystoid macular edema. PED - pigment epithelial detachment. IRF - intraretinal fluid. SRF - subretinal fluid. EZ - ellipsoid zone. ERM - epiretinal membrane. ORA - outer retinal atrophy. ORT - outer retinal tubulation. SRHM - subretinal hyper-reflective material. IRHM - intraretinal hyper-reflective material             ASSESSMENT/PLAN:   ICD-10-CM   1. Left retinal detachment  H33.22 OCT, Retina - OU - Both Eyes    2. Vitreous hemorrhage of left eye (HCC)  H43.12     3. Lattice degeneration of right retina  H35.411     4. Retinal hole of right eye  H33.321  1,2. Rhegmatogenous retinal detachment with vitreous hemorrhage, OS  - pt reports progressive vision loss OS w/ onset on Saturday, 10.05.24, green blobs in vision  - denies photopsias  - vision became increasing blurry and presented to ED on Sunday, 10.06.24  - outpt follow up with Dr. Bascom Levels on 10.07.24 - exam 10.8.24 w/ diffuse VH (no view of posterior pole), two large retinal tears at 1000 and 1100 (superonasal periphery) with +SRF - B-scan 10.8.24 ultrasound shows focal peripheral detachment in superonasal quad with tears at 1000 and 1100, matching exam - POW4 s/p PPV/PFO/EL/FAX/14% C3F8 OS, 10.09.2024             - doing well              - retina attached and in good position -- good laser around breaks -- not fully  pigmented             - IOP OS 11             - cont   PF QID OS                          PSO ung QID OS -- decrease to PRN             - keep head elevated; avoid laying flat on back  - post op drop and positioning instructions reviewed              - f/u 2-3 weeks -- POV  3,4. Lattice degeneration w/ atrophic holes, right eye - patches of lattice at 11 and from 0430-0600, smaller patches of lattice at 0300 and 0730 equator - discussed findings, prognosis, and treatment options including observation - s/p retinopexy OD (10.08.24) - monitor   Ophthalmic Meds Ordered this visit:  No orders of the defined types were placed in this encounter.    Return for f/u 2-3 weeks, RD OS, DFE, OCT.  There are no Patient Instructions on file for this visit. This document serves as a record of services personally performed by Karie Chimera, MD, PhD. It was created on their behalf by Berlin Hun COT, an ophthalmic technician. The creation of this record is the provider's dictation and/or activities during the visit.    Electronically signed by: Berlin Hun COT 11.07.24 8:55 AM  This document serves as a record of services personally performed by Karie Chimera, MD, PhD. It was created on their behalf by Glee Arvin. Manson Passey, OA an ophthalmic technician. The creation of this record is the provider's dictation and/or activities during the visit.    Electronically signed by: Glee Arvin. Manson Passey, OA 06/15/23 8:55 AM  Karie Chimera, M.D., Ph.D. Diseases & Surgery of the Retina and Vitreous [facility] 06/15/2023   I have reviewed the above documentation for accuracy and completeness, and I agree with the above. Karie Chimera, M.D., Ph.D. 06/15/23 8:57 AM   Abbreviations: M myopia (nearsighted); A astigmatism; H hyperopia (farsighted); P presbyopia; Mrx spectacle prescription;  CTL contact lenses; OD right eye; OS left eye; OU both eyes  XT exotropia; ET  esotropia; PEK punctate epithelial keratitis; PEE punctate epithelial erosions; DES dry eye syndrome; MGD meibomian gland dysfunction; ATs artificial tears; PFAT's preservative free artificial tears; NSC nuclear sclerotic cataract; PSC posterior subcapsular cataract; ERM epi-retinal membrane; PVD posterior vitreous detachment; RD retinal detachment; DM diabetes mellitus; DR diabetic retinopathy; NPDR non-proliferative diabetic retinopathy; PDR proliferative diabetic retinopathy; CSME  clinically significant macular edema; DME diabetic macular edema; dbh dot blot hemorrhages; CWS cotton wool spot; POAG primary open angle glaucoma; C/D cup-to-disc ratio; HVF humphrey visual field; GVF goldmann visual field; OCT optical coherence tomography; IOP intraocular pressure; BRVO Branch retinal vein occlusion; CRVO central retinal vein occlusion; CRAO central retinal artery occlusion; BRAO branch retinal artery occlusion; RT retinal tear; SB scleral buckle; PPV pars plana vitrectomy; VH Vitreous hemorrhage; PRP panretinal laser photocoagulation; IVK intravitreal kenalog; VMT vitreomacular traction; MH Macular hole;  NVD neovascularization of the disc; NVE neovascularization elsewhere; AREDS age related eye disease study; ARMD age related macular degeneration; POAG primary open angle glaucoma; EBMD epithelial/anterior basement membrane dystrophy; ACIOL anterior chamber intraocular lens; IOL intraocular lens; PCIOL posterior chamber intraocular lens; Phaco/IOL phacoemulsification with intraocular lens placement; PRK photorefractive keratectomy; LASIK laser assisted in situ keratomileusis; HTN hypertension; DM diabetes mellitus; COPD chronic obstructive pulmonary disease

## 2023-06-15 ENCOUNTER — Encounter (INDEPENDENT_AMBULATORY_CARE_PROVIDER_SITE_OTHER): Payer: Self-pay | Admitting: Ophthalmology

## 2023-06-15 ENCOUNTER — Ambulatory Visit (INDEPENDENT_AMBULATORY_CARE_PROVIDER_SITE_OTHER): Payer: BC Managed Care – PPO | Admitting: Ophthalmology

## 2023-06-15 DIAGNOSIS — H4312 Vitreous hemorrhage, left eye: Secondary | ICD-10-CM | POA: Diagnosis not present

## 2023-06-15 DIAGNOSIS — H3322 Serous retinal detachment, left eye: Secondary | ICD-10-CM

## 2023-06-15 DIAGNOSIS — H33321 Round hole, right eye: Secondary | ICD-10-CM

## 2023-06-15 DIAGNOSIS — H35411 Lattice degeneration of retina, right eye: Secondary | ICD-10-CM

## 2023-06-19 NOTE — Progress Notes (Signed)
[facility] - Clinic Note  07/02/2023   CHIEF COMPLAINT Patient presents for Retina Follow Up  HISTORY OF PRESENT ILLNESS: Kimberly Richards is a 47 y.o. female who presents to the clinic today for:  HPI     Retina Follow Up   Patient presents with  Retinal Break/Detachment.  In left eye.  This started 2 weeks ago.  I, the attending physician,  performed the HPI with the patient and updated documentation appropriately.        Comments   Patient here for 2 weeks retina follow up for RD OS. Patient states vision getting better. The bubble is lower. Gets a glare like streak of light. Some times has eye pain. Not sure if from light sensitivity. Using PF QID OS.      Last edited by [name], MD on 07/03/2023 10:58 PM.    Pt states she can still see the gas bubble, she gets "glares" off the bubble, feels like vision is improving, but not back to baseline yet   Referring physician: [place], [place], OD [address] [place],  [place] [address]  HISTORICAL INFORMATION:  Selected notes from the MEDICAL RECORD NUMBER Referred by Dr. Bascom Levels for concern of RD OS LEE:  Ocular Hx- PMH-   CURRENT MEDICATIONS: Current Outpatient Medications (Ophthalmic Drugs)  Medication Sig   prednisoLONE acetate (PRED FORTE) 1 % ophthalmic suspension Place 1 drop into the right eye 4 (four) times daily.   No current facility-administered medications for this visit. (Ophthalmic Drugs)   Current Outpatient Medications (Other)  Medication Sig   Azelaic Acid 15 % gel APPLY TOPICALLY TO AFFECTED AREA TWICE DAILY IN THE MORNING AND EVENING AFTER SKIN HAS BEEN THOROUGHLY WASHED AND PATTED DRY   B Complex-C-Folic Acid (HM VITAMIN B COMPLEX/VITAMIN C PO) Take 1 tablet by mouth 2 (two) times daily.   clonazePAM (KLONOPIN) 0.5 MG tablet Take 0.5-1 tablets (0.25-0.5 mg total) by mouth 2 (two) times daily as needed for anxiety.   Digestive Enzymes (DIGESTIVE ENZYME PO) Take by  mouth daily at 12 noon.   FLUoxetine (PROZAC) 10 MG tablet Take 1 tablet by mouth once daily   fluticasone (FLONASE) 50 MCG/ACT nasal spray Place 2 sprays into both nostrils daily.   magnesium gluconate (MAGONATE) 500 MG tablet Take 500 mg by mouth at bedtime.   MELATONIN PO Take by mouth at bedtime. Free Sleep Aid   Multiple Vitamin (MULTIVITAMIN) tablet Take 1 tablet by mouth daily.   NON FORMULARY 2 (two) times daily. Vegan Omega 3   Probiotic Product (FORTIFY PROBIOTIC WOMENS EX ST PO) Take 1 tablet by mouth daily.   spironolactone (ALDACTONE) 50 MG tablet Take 1 tablet by mouth once daily   tretinoin (RETIN-A) 0.025 % cream Apply topically at bedtime.   VALERIAN PO Take by mouth.   vitamin E 180 MG (400 UNITS) capsule Take 400 Units by mouth daily.   No current facility-administered medications for this visit. (Other)   REVIEW OF SYSTEMS: ROS   Positive for: Neurological, Skin, Eyes Negative for: Constitutional, Gastrointestinal, Genitourinary, Musculoskeletal, HENT, Endocrine, Cardiovascular, Respiratory, Psychiatric, Allergic/Imm, Heme/Lymph Last edited by Laddie Aquas, COA on 07/02/2023  8:04 AM.     ALLERGIES Allergies  Allergen Reactions   Stadol [Butorphanol]    Sulfa Antibiotics     Other reaction(s): gi distress   PAST MEDICAL HISTORY Past Medical History:  Diagnosis Date   Abnormal Pap smear of cervix    Acne  On Aldactone   Anemia    Anxiety with depression 12/18/2017   Cancer (HCC)    Constipation    H/O ovarian cancer    High cholesterol    Hot flashes    HPV (human papilloma virus) anogenital infection    Other hyperlipidemia 07/03/2022   Ovarian cyst    PONV (postoperative nausea and vomiting)    Primary hyperparathyroidism (HCC)    Past Surgical History:  Procedure Laterality Date   ABDOMINAL HYSTERECTOMY     BREAST BIOPSY Right    CESAREAN SECTION     x3   EYE SURGERY Right    office procedure of the right eye   GAS INSERTION Left  05/16/2023   Procedure: INSERTION OF GAS;  Surgeon: [name], MD;  Location: [facility] OR;  Service: Ophthalmology;  Laterality: Left;   LAPAROSCOPIC HYSTERECTOMY     total   LAPAROTOMY     Ovarian mass removed  12/06/2011   Borderline Ovarian Cancer   PARS PLANA VITRECTOMY Left 05/16/2023   Procedure: PARS PLANA VITRECTOMY WITH 25 GAUGE;  Surgeon: [name], MD;  Location: [facility] OR;  Service: Ophthalmology;  Laterality: Left;   PHOTOCOAGULATION WITH LASER Left 05/16/2023   Procedure: PHOTOCOAGULATION WITH LASER;  Surgeon: [name], MD;  Location: [facility] OR;  Service: Ophthalmology;  Laterality: Left;   SMALL INTESTINE SURGERY     FAMILY HISTORY Family History  Problem Relation Age of Onset   Hypertension Mother    Diabetes Mother    High blood pressure Mother    Depression Mother    Drug abuse Mother    Obesity Mother    Hypertension Father    Diabetes Father    High Cholesterol Father    Heart disease Father    Cancer Father    Drug abuse Father    Hypertension Maternal Grandmother    Diabetes Maternal Grandmother    Hypertension Maternal Grandfather    Diabetes Maternal Grandfather    Cancer Paternal Grandmother        breast   Hypertension Paternal Grandmother    Diabetes Paternal Grandmother    Hypertension Paternal Grandfather    Diabetes Paternal Grandfather    Rectal cancer Other    Stomach cancer Other    Esophageal cancer Other    Colon polyps Other    Colon cancer Other    SOCIAL HISTORY Social History   Tobacco Use   Smoking status: Never   Smokeless tobacco: Never  Vaping Use   Vaping status: Never Used  Substance Use Topics   Alcohol use: Yes    Comment: cocktails 3 x week   Drug use: No       OPHTHALMIC EXAM:  Base Eye Exam     Visual Acuity (Snellen - Linear)       Right Left   Dist [place] 20/20 20/100 -2   Dist ph [place]  20/50    Correction: Glasses         Tonometry (Tonopen, 8:00 AM)       Right Left   Pressure 17 14          Pupils       Dark Light Shape React APD   Right 3 2 Round Brisk None   Left 6 6 Round NR None         Visual Fields (Counting fingers)       Left Right     Full   Restrictions Total inferior temporal, inferior nasal deficiencies  Extraocular Movement       Right Left    Full, Ortho Full, Ortho         Neuro/Psych     Oriented x3: Yes   Mood/Affect: Normal         Dilation     Left eye: 1.0% Mydriacyl, 2.5% Phenylephrine @ 8:00 AM           Slit Lamp and Fundus Exam     Slit Lamp Exam       Right Left   Lids/Lashes Normal Dermatochalasis - upper lid   Conjunctiva/Sclera mild melanosis, Subconjunctival hemorrhage, Chemosis mild melanosis, sutures dissolved   Cornea arcus, trace tear film debris Mild arcus, trace tear film debris   Anterior Chamber deep, clear, narrow temporal angle Deep, 1+fine cell, fibrin reaction -- resolved   Iris Round and reactive Round and dilated   Lens Clear 2+ Nuclear sclerosis, 2-3+ Cortical cataract, 1+ Posterior subcapsular cataract   Anterior Vitreous mild syneresis post vitrectomy, mild pigment, 25% gas fill         Fundus Exam       Right Left   Disc Pink and Sharp, mild PPP Pink and Sharp   C/D Ratio 0.6 0.5   Macula Flat, Good foveal reflex, RPE mottling, No heme or edema Flat, Good foveal reflex, mild RPE mottling, No heme or edema   Vessels Normal mild attenuation, mild copper wiring   Periphery Attached, patches of lattice degeneration at 1100 and from 0430-0600, smaller patches of lattice at 0300 and 0730 equator -- good early laser changes surrounding all lesions, no new RT/RD or lattice Attached, good laser surrounding large tears SN quad at 1000 and 1100, good 360 peripheral laser; PRE-OP: 2 large tears SN quad at 1000 and 1100 with +SRF; no other details visible elsewhere           Refraction     Wearing Rx       Sphere Cylinder Axis Add   Right -6.50 +4.75 094 +2.00   Left -6.25 +4.00 091  +2.00           IMAGING AND PROCEDURES  Imaging and Procedures for 07/02/2023  OCT, Retina - OU - Both Eyes       Right Eye Quality was good. Central Foveal Thickness: 273. Progression has been stable. Findings include normal foveal contour, no IRF, no SRF, vitreomacular adhesion .   Left Eye Quality was good. Central Foveal Thickness: 274. Progression has improved. Findings include normal foveal contour, no IRF, no SRF (SN retina re-attached, mild improvement in vitreous opacities).   Notes *Images captured and stored on drive  Diagnosis / Impression:  OF: NFP, no IRF/SRF OS: SN retina re-attached, mild improvement in vitreous opacities  Clinical management:  See below  Abbreviations: NFP - Normal foveal profile. CME - cystoid macular edema. PED - pigment epithelial detachment. IRF - intraretinal fluid. SRF - subretinal fluid. EZ - ellipsoid zone. ERM - epiretinal membrane. ORA - outer retinal atrophy. ORT - outer retinal tubulation. SRHM - subretinal hyper-reflective material. IRHM - intraretinal hyper-reflective material           ASSESSMENT/PLAN:   ICD-10-CM   1. Left retinal detachment  H33.22 OCT, Retina - OU - Both Eyes    2. Vitreous hemorrhage of left eye (HCC)  H43.12     3. Lattice degeneration of right retina  H35.411     4. Retinal hole of right eye  H33.321     5. Combined forms  of age-related cataract of left eye  H25.812      1,2. Rhegmatogenous retinal detachment with vitreous hemorrhage, OS  - pt reports progressive vision loss OS w/ onset on Saturday, 10.05.24, green blobs in vision  - denies photopsias  - vision became increasing blurry and presented to ED on Sunday, 10.06.24  - outpt follow up with Dr. Bascom Levels on 10.07.24 - exam 10.8.24 w/ diffuse VH (no view of posterior pole), two large retinal tears at 1000 and 1100 (superonasal periphery) with +SRF - B-scan 10.8.24 ultrasound shows focal peripheral detachment in superonasal quad with  tears at 1000 and 1100, matching exam - POW6 s/p PPV/PFO/EL/FAX/14% C3F8 OS, 10.09.2024             - doing well              - retina attached and in good position -- good laser around breaks -- not fully pigmented             - IOP OS 14             - start PF taper -- 3,2,1 drops daily, decrease every 2 weeks - PSO ung PRN OS             - keep head elevated; avoid laying flat on back  - post op drop and positioning instructions reviewed  - pt cleared to return to work on December 4th             - f/u 3 weeks -- POV, DFE OU  3,4. Lattice degeneration w/ atrophic holes, right eye - patches of lattice at 11 and from 0430-0600, smaller patches of lattice at 0300 and 0730 equator - discussed findings, prognosis, and treatment options including observation - s/p retinopexy OD (10.08.24) - monitor  5. Mixed Cataract OS - The symptoms of cataract, surgical options, and treatments and risks were discussed with patient. - discussed diagnosis and progression - not yet visually significant - monitor for now  Ophthalmic Meds Ordered this visit:  No orders of the defined types were placed in this encounter.    Return in about 3 weeks (around 07/23/2023) for f/u RD OS, DFE, OCT.  There are no Patient Instructions on file for this visit.  This document serves as a record of services personally performed by Karie Chimera, MD, PhD. It was created on their behalf by Glee Arvin. Manson Passey, OA an ophthalmic technician. The creation of this record is the provider's dictation and/or activities during the visit.    Electronically signed by: Glee Arvin. Manson Passey, OA 07/03/23 11:02 PM  Karie Chimera, M.D., Ph.D. Diseases & Surgery of the Retina and Vitreous [facility] 07/02/2023   I have reviewed the above documentation for accuracy and completeness, and I agree with the above. Karie Chimera, M.D., Ph.D. 07/03/23 11:04 PM  Abbreviations: M myopia (nearsighted); A  astigmatism; H hyperopia (farsighted); P presbyopia; Mrx spectacle prescription;  CTL contact lenses; OD right eye; OS left eye; OU both eyes  XT exotropia; ET esotropia; PEK punctate epithelial keratitis; PEE punctate epithelial erosions; DES dry eye syndrome; MGD meibomian gland dysfunction; ATs artificial tears; PFAT's preservative free artificial tears; NSC nuclear sclerotic cataract; PSC posterior subcapsular cataract; ERM epi-retinal membrane; PVD posterior vitreous detachment; RD retinal detachment; DM diabetes mellitus; DR diabetic retinopathy; NPDR non-proliferative diabetic retinopathy; PDR proliferative diabetic retinopathy; CSME clinically significant macular edema; DME diabetic macular edema; dbh dot blot hemorrhages; CWS cotton wool spot; POAG  primary open angle glaucoma; C/D cup-to-disc ratio; HVF humphrey visual field; GVF goldmann visual field; OCT optical coherence tomography; IOP intraocular pressure; BRVO Branch retinal vein occlusion; CRVO central retinal vein occlusion; CRAO central retinal artery occlusion; BRAO branch retinal artery occlusion; RT retinal tear; SB scleral buckle; PPV pars plana vitrectomy; VH Vitreous hemorrhage; PRP panretinal laser photocoagulation; IVK intravitreal kenalog; VMT vitreomacular traction; MH Macular hole;  NVD neovascularization of the disc; NVE neovascularization elsewhere; AREDS age related eye disease study; ARMD age related macular degeneration; POAG primary open angle glaucoma; EBMD epithelial/anterior basement membrane dystrophy; ACIOL anterior chamber intraocular lens; IOL intraocular lens; PCIOL posterior chamber intraocular lens; Phaco/IOL phacoemulsification with intraocular lens placement; PRK photorefractive keratectomy; LASIK laser assisted in situ keratomileusis; HTN hypertension; DM diabetes mellitus; COPD chronic obstructive pulmonary disease

## 2023-07-02 ENCOUNTER — Encounter (INDEPENDENT_AMBULATORY_CARE_PROVIDER_SITE_OTHER): Payer: Self-pay | Admitting: Ophthalmology

## 2023-07-02 ENCOUNTER — Ambulatory Visit (INDEPENDENT_AMBULATORY_CARE_PROVIDER_SITE_OTHER): Payer: BC Managed Care – PPO | Admitting: Ophthalmology

## 2023-07-02 DIAGNOSIS — H3322 Serous retinal detachment, left eye: Secondary | ICD-10-CM

## 2023-07-02 DIAGNOSIS — H4312 Vitreous hemorrhage, left eye: Secondary | ICD-10-CM | POA: Diagnosis not present

## 2023-07-02 DIAGNOSIS — H35411 Lattice degeneration of retina, right eye: Secondary | ICD-10-CM

## 2023-07-02 DIAGNOSIS — H25812 Combined forms of age-related cataract, left eye: Secondary | ICD-10-CM

## 2023-07-02 DIAGNOSIS — H33321 Round hole, right eye: Secondary | ICD-10-CM

## 2023-07-03 ENCOUNTER — Encounter (INDEPENDENT_AMBULATORY_CARE_PROVIDER_SITE_OTHER): Payer: Self-pay | Admitting: Ophthalmology

## 2023-07-07 ENCOUNTER — Encounter (INDEPENDENT_AMBULATORY_CARE_PROVIDER_SITE_OTHER): Payer: Self-pay | Admitting: Ophthalmology

## 2023-07-09 ENCOUNTER — Ambulatory Visit
Admission: RE | Admit: 2023-07-09 | Discharge: 2023-07-09 | Disposition: A | Discharge status: Home / Self Care | Payer: BC Managed Care – PPO | Source: Ambulatory Visit | Attending: Family Medicine

## 2023-07-09 ENCOUNTER — Other Ambulatory Visit: Payer: Self-pay | Admitting: Family Medicine

## 2023-07-09 DIAGNOSIS — N6320 Unspecified lump in the left breast, unspecified quadrant: Secondary | ICD-10-CM

## 2023-07-09 DIAGNOSIS — N6310 Unspecified lump in the right breast, unspecified quadrant: Secondary | ICD-10-CM

## 2023-07-10 ENCOUNTER — Encounter: Payer: Self-pay | Admitting: Family Medicine

## 2023-07-12 NOTE — Progress Notes (Signed)
[facility] - Clinic Note  07/23/2023   CHIEF COMPLAINT Patient presents for Retina Follow Up  HISTORY OF PRESENT ILLNESS: Kimberly Richards is a 47 y.o. female who presents to the clinic today for:  HPI     Retina Follow Up   Patient presents with  Retinal Break/Detachment.  In left eye.  This started 3 weeks ago.  Duration of 3 weeks.  Since onset it is stable.  I, the attending physician,  performed the HPI with the patient and updated documentation appropriately.        Comments   3 week retina follow up RD OS pt is reporting vision is about the same she has had some floaters denies any flashes       Last edited by [name], MD on 07/24/2023 12:36 AM.    Pt states she has been working, but she has not been doing her normal tasks bc she doesn't have the fine vision needed to do some of her tasks, the gas bubble is gone   Referring physician: Fraz[place], [place], OD [address] [place],  [place] [address]  HISTORICAL INFORMATION:  Selected notes from the MEDICAL RECORD NUMBER Referred by Dr. Bascom Levels for concern of RD OS LEE:  Ocular Hx- PMH-   CURRENT MEDICATIONS: Current Outpatient Medications (Ophthalmic Drugs)  Medication Sig   Bromfenac Sodium 0.07 % SOLN Place 1 drop into the left eye 4 (four) times daily.   prednisoLONE acetate (PRED FORTE) 1 % ophthalmic suspension Place 1 drop into the right eye 4 (four) times daily.   No current facility-administered medications for this visit. (Ophthalmic Drugs)   Current Outpatient Medications (Other)  Medication Sig   Azelaic Acid 15 % gel APPLY TOPICALLY TO AFFECTED AREA TWICE DAILY IN THE MORNING AND EVENING AFTER SKIN HAS BEEN THOROUGHLY WASHED AND PATTED DRY   B Complex-C-Folic Acid (HM VITAMIN B COMPLEX/VITAMIN C PO) Take 1 tablet by mouth 2 (two) times daily.   clonazePAM (KLONOPIN) 0.5 MG tablet Take 0.5-1 tablets (0.25-0.5 mg total) by mouth 2 (two) times daily as needed for  anxiety.   Digestive Enzymes (DIGESTIVE ENZYME PO) Take by mouth daily at 12 noon.   FLUoxetine (PROZAC) 10 MG tablet Take 1 tablet by mouth once daily   fluticasone (FLONASE) 50 MCG/ACT nasal spray Place 2 sprays into both nostrils daily.   magnesium gluconate (MAGONATE) 500 MG tablet Take 500 mg by mouth at bedtime.   MELATONIN PO Take by mouth at bedtime. Free Sleep Aid   Multiple Vitamin (MULTIVITAMIN) tablet Take 1 tablet by mouth daily.   NON FORMULARY 2 (two) times daily. Vegan Omega 3   Probiotic Product (FORTIFY PROBIOTIC WOMENS EX ST PO) Take 1 tablet by mouth daily.   spironolactone (ALDACTONE) 50 MG tablet Take 1 tablet by mouth once daily   tretinoin (RETIN-A) 0.025 % cream Apply topically at bedtime.   VALERIAN PO Take by mouth.   vitamin E 180 MG (400 UNITS) capsule Take 400 Units by mouth daily.   No current facility-administered medications for this visit. (Other)   REVIEW OF SYSTEMS: ROS   Positive for: Neurological, Skin, Eyes Negative for: Constitutional, Gastrointestinal, Genitourinary, Musculoskeletal, HENT, Endocrine, Cardiovascular, Respiratory, Psychiatric, Allergic/Imm, Heme/Lymph Last edited by Etheleen Mayhew, COT on 07/23/2023  8:01 AM.      ALLERGIES Allergies  Allergen Reactions   Stadol [Butorphanol]    Sulfa Antibiotics     Other reaction(s): gi distress   PAST MEDICAL  HISTORY Past Medical History:  Diagnosis Date   Abnormal Pap smear of cervix    Acne    On Aldactone   Anemia    Anxiety with depression 12/18/2017   Cancer (HCC)    Constipation    H/O ovarian cancer    High cholesterol    Hot flashes    HPV (human papilloma virus) anogenital infection    Other hyperlipidemia 07/03/2022   Ovarian cyst    PONV (postoperative nausea and vomiting)    Primary hyperparathyroidism (HCC)    Past Surgical History:  Procedure Laterality Date   ABDOMINAL HYSTERECTOMY     BREAST BIOPSY Right    CESAREAN SECTION     x3   EYE SURGERY  Right    office procedure of the right eye   GAS INSERTION Left 05/16/2023   Procedure: INSERTION OF GAS;  Surgeon: [name], MD;  Location: [facility] OR;  Service: Ophthalmology;  Laterality: Left;   LAPAROSCOPIC HYSTERECTOMY     total   LAPAROTOMY     Ovarian mass removed  12/06/2011   Borderline Ovarian Cancer   PARS PLANA VITRECTOMY Left 05/16/2023   Procedure: PARS PLANA VITRECTOMY WITH 25 GAUGE;  Surgeon: [name], MD;  Location: [facility] OR;  Service: Ophthalmology;  Laterality: Left;   PHOTOCOAGULATION WITH LASER Left 05/16/2023   Procedure: PHOTOCOAGULATION WITH LASER;  Surgeon: [name], MD;  Location: [facility] OR;  Service: Ophthalmology;  Laterality: Left;   SMALL INTESTINE SURGERY     FAMILY HISTORY Family History  Problem Relation Age of Onset   Hypertension Mother    Diabetes Mother    High blood pressure Mother    Depression Mother    Drug abuse Mother    Obesity Mother    Hypertension Father    Diabetes Father    High Cholesterol Father    Heart disease Father    Cancer Father    Drug abuse Father    Hypertension Maternal Grandmother    Diabetes Maternal Grandmother    Hypertension Maternal Grandfather    Diabetes Maternal Grandfather    Cancer Paternal Grandmother        breast   Hypertension Paternal Grandmother    Diabetes Paternal Grandmother    Hypertension Paternal Grandfather    Diabetes Paternal Grandfather    Rectal cancer Other    Stomach cancer Other    Esophageal cancer Other    Colon polyps Other    Colon cancer Other    SOCIAL HISTORY Social History   Tobacco Use   Smoking status: Never   Smokeless tobacco: Never  Vaping Use   Vaping status: Never Used  Substance Use Topics   Alcohol use: Yes    Comment: cocktails 3 x week   Drug use: No       OPHTHALMIC EXAM:  Base Eye Exam     Visual Acuity (Snellen - Linear)       Right Left   Dist cc 20/20 20/150   Dist ph cc  20/100 -1    Correction: Glasses         Tonometry  (Tonopen, 8:06 AM)       Right Left   Pressure 10 14         Pupils       Pupils Dark Light Shape React APD   Right PERRL 3 2 Round Brisk None   Left PERRL 5 5 Round NR None         Visual Fields  Left Right    Full Full         Extraocular Movement       Right Left    Full, Ortho Full, Ortho         Neuro/Psych     Oriented x3: Yes   Mood/Affect: Normal         Dilation     Both eyes: 2.5% Phenylephrine @ 8:06 AM           Slit Lamp and Fundus Exam     Slit Lamp Exam       Right Left   Lids/Lashes Normal Dermatochalasis - upper lid   Conjunctiva/Sclera mild melanosis mild melanosis   Cornea arcus, trace tear film debris Mild arcus, trace tear film debris, 1+ Punctate epithelial erosions   Anterior Chamber deep, clear, narrow temporal angle Deep, 1+fine cell and pigment   Iris Round and reactive Round and dilated   Lens 1+ Cortical cataract 2+ Nuclear sclerosis, 2+ Cortical cataract, 1+ Posterior subcapsular cataract, fine pigment on anterior capsule   Anterior Vitreous mild syneresis post vitrectomy, mild pigment, gas bubble gone         Fundus Exam       Right Left   Disc Pink and Sharp, mild PPP Pink and Sharp   C/D Ratio 0.6 0.5   Macula Flat, Good foveal reflex, RPE mottling, No heme or edema Flat, Good foveal reflex, mild RPE mottling, mild central cystic changes   Vessels attenuated, mild tortuosity mild attenuation   Periphery Attached, patches of lattice degeneration at 1100 and from 0430-0600, smaller patches of lattice at 0300 and 0730 equator -- good laser changes surrounding all lesions, no new RT/RD or lattice Attached, good laser surrounding large tears SN quad at 1000 and 1100, good 360 peripheral laser; PRE-OP: 2 large tears SN quad at 1000 and 1100 with +SRF; no other details visible elsewhere           Refraction     Wearing Rx       Sphere Cylinder Axis Add   Right -6.50 +4.75 094 +2.00   Left -6.25 +4.00  091 +2.00           IMAGING AND PROCEDURES  Imaging and Procedures for 07/23/2023  OCT, Retina - OU - Both Eyes       Right Eye Quality was good. Central Foveal Thickness: 273. Progression has been stable. Findings include normal foveal contour, no IRF, no SRF, vitreomacular adhesion .   Left Eye Quality was good. Central Foveal Thickness: 317. Progression has worsened. Findings include normal foveal contour, no SRF, intraretinal fluid (SN retina re-attached, persistent vitreous opacities, interval development of central cystic changes / CME).   Notes *Images captured and stored on drive  Diagnosis / Impression:  OD: NFP, no IRF/SRF OS: SN retina stably re-attached, persistent vitreous opacities, interval development of central cystic changes / CME  Clinical management:  See below  Abbreviations: NFP - Normal foveal profile. CME - cystoid macular edema. PED - pigment epithelial detachment. IRF - intraretinal fluid. SRF - subretinal fluid. EZ - ellipsoid zone. ERM - epiretinal membrane. ORA - outer retinal atrophy. ORT - outer retinal tubulation. SRHM - subretinal hyper-reflective material. IRHM - intraretinal hyper-reflective material           ASSESSMENT/PLAN:   ICD-10-CM   1. Left retinal detachment  H33.22 OCT, Retina - OU - Both Eyes    2. Vitreous hemorrhage of left eye (HCC)  H43.12  3. Lattice degeneration of right retina  H35.411     4. Retinal hole of right eye  H33.321     5. Combined forms of age-related cataract of left eye  H25.812      1,2. Rhegmatogenous retinal detachment with vitreous hemorrhage, OS  - pt reports progressive vision loss OS w/ onset on Saturday, 10.05.24, green blobs in vision  - denies photopsias  - vision became increasing blurry and presented to ED on Sunday, 10.06.24  - outpt follow up with Dr. Bascom Levels on 10.07.24 - exam 10.8.24 w/ diffuse VH (no view of posterior pole), two large retinal tears at 1000 and 1100 (superonasal  periphery) with +SRF - B-scan 10.8.24 ultrasound shows focal peripheral detachment in superonasal quad with tears at 1000 and 1100, matching exam - s/p PPV/PFO/EL/FAX/14% C3F8 OS, 10.09.2024             - doing well -- gas bubble gone             - retina attached and in good position -- good laser around breaks and 360             - IOP OS 14  - OCT OS shows new onset central CME / cystic changes since tapering off PredForte -- BCVA OS 20/100 from 20/50             - re-start PF QID OS and start bromfenac QID OS -- due to interval development of CME - post op drop and positioning instructions reviewed  - recommend staying out of work vs. Sherlynn Stalls duty until post operative healing / recovery is complete             - f/u 4 weeks -- DFE, OCT  3,4. Lattice degeneration w/ atrophic holes, right eye - patches of lattice at 11 and from 0430-0600, smaller patches of lattice at 0300 and 0730 equator - discussed findings, prognosis, and treatment options including observation - s/p retinopexy OD (10.08.24) -- good laser surrounding all lesions - no new RT/RD or lattice - monitor  5. Mixed Cataract OS - The symptoms of cataract, surgical options, and treatments and risks were discussed with patient. - discussed diagnosis and progression, specifically post-vitrectomy cataract progression - monitor for now  Ophthalmic Meds Ordered this visit:  Meds ordered this encounter  Medications   Bromfenac Sodium 0.07 % SOLN    Sig: Place 1 drop into the left eye 4 (four) times daily.    Dispense:  3 mL    Refill:  6   prednisoLONE acetate (PRED FORTE) 1 % ophthalmic suspension    Sig: Place 1 drop into the right eye 4 (four) times daily.    Dispense:  15 mL    Refill:  0     Return in about 4 weeks (around 08/20/2023) for f/u RD OS, DFE, OCT.  There are no Patient Instructions on file for this visit.  This document serves as a record of services personally performed by Karie Chimera, MD, PhD. It  was created on their behalf by Glee Arvin. Manson Passey, OA an ophthalmic technician. The creation of this record is the provider's dictation and/or activities during the visit.    Electronically signed by: Glee Arvin. Manson Passey, OA 07/24/23 12:37 AM  Karie Chimera, M.D., Ph.D. Diseases & Surgery of the Retina and Vitreous [facility] [place] 07/23/2023   I have reviewed the above documentation for accuracy and completeness, and I agree with the above. Isaias Cowman  Vanessa Barbara, M.D., Ph.D. 07/24/23 12:43 AM   Abbreviations: M myopia (nearsighted); A astigmatism; H hyperopia (farsighted); P presbyopia; Mrx spectacle prescription;  CTL contact lenses; OD right eye; OS left eye; OU both eyes  XT exotropia; ET esotropia; PEK punctate epithelial keratitis; PEE punctate epithelial erosions; DES dry eye syndrome; MGD meibomian gland dysfunction; ATs artificial tears; PFAT's preservative free artificial tears; NSC nuclear sclerotic cataract; PSC posterior subcapsular cataract; ERM epi-retinal membrane; PVD posterior vitreous detachment; RD retinal detachment; DM diabetes mellitus; DR diabetic retinopathy; NPDR non-proliferative diabetic retinopathy; PDR proliferative diabetic retinopathy; CSME clinically significant macular edema; DME diabetic macular edema; dbh dot blot hemorrhages; CWS cotton wool spot; POAG primary open angle glaucoma; C/D cup-to-disc ratio; HVF humphrey visual field; GVF goldmann visual field; OCT optical coherence tomography; IOP intraocular pressure; BRVO Branch retinal vein occlusion; CRVO central retinal vein occlusion; CRAO central retinal artery occlusion; BRAO branch retinal artery occlusion; RT retinal tear; SB scleral buckle; PPV pars plana vitrectomy; VH Vitreous hemorrhage; PRP panretinal laser photocoagulation; IVK intravitreal kenalog; VMT vitreomacular traction; MH Macular hole;  NVD neovascularization of the disc; NVE neovascularization elsewhere; AREDS age related eye disease  study; ARMD age related macular degeneration; POAG primary open angle glaucoma; EBMD epithelial/anterior basement membrane dystrophy; ACIOL anterior chamber intraocular lens; IOL intraocular lens; PCIOL posterior chamber intraocular lens; Phaco/IOL phacoemulsification with intraocular lens placement; PRK photorefractive keratectomy; LASIK laser assisted in situ keratomileusis; HTN hypertension; DM diabetes mellitus; COPD chronic obstructive pulmonary disease

## 2023-07-23 ENCOUNTER — Encounter (INDEPENDENT_AMBULATORY_CARE_PROVIDER_SITE_OTHER): Payer: Self-pay | Admitting: Ophthalmology

## 2023-07-23 ENCOUNTER — Ambulatory Visit (INDEPENDENT_AMBULATORY_CARE_PROVIDER_SITE_OTHER): Payer: BC Managed Care – PPO | Admitting: Ophthalmology

## 2023-07-23 ENCOUNTER — Encounter (INDEPENDENT_AMBULATORY_CARE_PROVIDER_SITE_OTHER): Payer: Self-pay

## 2023-07-23 DIAGNOSIS — H33321 Round hole, right eye: Secondary | ICD-10-CM

## 2023-07-23 DIAGNOSIS — H25812 Combined forms of age-related cataract, left eye: Secondary | ICD-10-CM

## 2023-07-23 DIAGNOSIS — H4312 Vitreous hemorrhage, left eye: Secondary | ICD-10-CM | POA: Diagnosis not present

## 2023-07-23 DIAGNOSIS — H35411 Lattice degeneration of retina, right eye: Secondary | ICD-10-CM

## 2023-07-23 DIAGNOSIS — H3322 Serous retinal detachment, left eye: Secondary | ICD-10-CM

## 2023-07-23 MED ORDER — PREDNISOLONE ACETATE 1 % OP SUSP: 1.0000 [drp] | Freq: Four times a day (QID) | OPHTHALMIC | 0 refills | Status: DC

## 2023-07-23 MED ORDER — BROMFENAC SODIUM 0.07 % OP SOLN: 1.0000 [drp] | Freq: Four times a day (QID) | OPHTHALMIC | 6 refills | Status: DC

## 2023-07-24 ENCOUNTER — Other Ambulatory Visit (HOSPITAL_COMMUNITY): Payer: Self-pay

## 2023-07-24 ENCOUNTER — Encounter (INDEPENDENT_AMBULATORY_CARE_PROVIDER_SITE_OTHER): Payer: Self-pay | Admitting: Ophthalmology

## 2023-08-09 NOTE — Progress Notes (Signed)
 [facility] - Clinic Note  08/20/2023   CHIEF COMPLAINT Patient presents for Retina Follow Up  HISTORY OF PRESENT ILLNESS: Kimberly Richards is a 48 y.o. female who presents to the clinic today for:  HPI     Retina Follow Up   Patient presents with  Retinal Break/Detachment.  In left eye.  This started 4 weeks ago.  Duration of 4 weeks.  Since onset it is stable.  I, the attending physician,  performed the HPI with the patient and updated documentation appropriately.        Comments   Patient feels the vision is a little better. She is using PF TID OS and Bromfenac  QID OS.      Last edited by Valdemar Rogue, MD on 08/20/2023 12:35 PM.    Pt states vision has improved a little, she is using PF TID OS and bromfenac  QID OS   Referring physician: Frazier, Chad, OD [address] [place],  [place] [address]  HISTORICAL INFORMATION:  Selected notes from the MEDICAL RECORD NUMBER Referred by Dr. Vivian for concern of RD OS LEE:  Ocular Hx- PMH-   CURRENT MEDICATIONS: Current Outpatient Medications (Ophthalmic Drugs)  Medication Sig   Bromfenac  Sodium 0.07 % SOLN Place 1 drop into the left eye 4 (four) times daily.   prednisoLONE  acetate (PRED FORTE ) 1 % ophthalmic suspension Place 1 drop into the right eye 4 (four) times daily.   No current facility-administered medications for this visit. (Ophthalmic Drugs)   Current Outpatient Medications (Other)  Medication Sig   Azelaic Acid  15 % gel APPLY TOPICALLY TO AFFECTED AREA TWICE DAILY IN THE MORNING AND EVENING AFTER SKIN HAS BEEN THOROUGHLY WASHED AND PATTED DRY   B Complex-C-Folic Acid (HM VITAMIN B COMPLEX/VITAMIN C PO) Take 1 tablet by mouth 2 (two) times daily.   clonazePAM  (KLONOPIN ) 0.5 MG tablet Take 0.5-1 tablets (0.25-0.5 mg total) by mouth 2 (two) times daily as needed for anxiety.   Digestive Enzymes (DIGESTIVE ENZYME PO) Take by mouth daily at 12 noon.   FLUoxetine  (PROZAC ) 10 MG  tablet Take 1 tablet by mouth once daily   fluticasone  (FLONASE ) 50 MCG/ACT nasal spray Place 2 sprays into both nostrils daily.   magnesium gluconate (MAGONATE) 500 MG tablet Take 500 mg by mouth at bedtime.   MELATONIN PO Take by mouth at bedtime. Free Sleep Aid   Multiple Vitamin (MULTIVITAMIN) tablet Take 1 tablet by mouth daily.   NON FORMULARY 2 (two) times daily. Vegan Omega 3   Probiotic Product (FORTIFY PROBIOTIC WOMENS EX ST PO) Take 1 tablet by mouth daily.   spironolactone  (ALDACTONE ) 50 MG tablet Take 1 tablet by mouth once daily   tretinoin  (RETIN-A ) 0.025 % cream Apply topically at bedtime.   VALERIAN PO Take by mouth.   vitamin E 180 MG (400 UNITS) capsule Take 400 Units by mouth daily.   No current facility-administered medications for this visit. (Other)   REVIEW OF SYSTEMS:    ALLERGIES Allergies  Allergen Reactions   Stadol [Butorphanol]    Sulfa  Antibiotics     Other reaction(s): gi distress   PAST MEDICAL HISTORY Past Medical History:  Diagnosis Date   Abnormal Pap smear of cervix    Acne    On Aldactone    Anemia    Anxiety with depression 12/18/2017   Cancer (HCC)    Constipation    H/O ovarian cancer    High cholesterol    Hot flashes  HPV (human papilloma virus) anogenital infection    Other hyperlipidemia 07/03/2022   Ovarian cyst    PONV (postoperative nausea and vomiting)    Primary hyperparathyroidism (HCC)    Past Surgical History:  Procedure Laterality Date   ABDOMINAL HYSTERECTOMY     BREAST BIOPSY Right    CESAREAN SECTION     x3   EYE SURGERY Right    office procedure of the right eye   GAS INSERTION Left 05/16/2023   Procedure: INSERTION OF GAS;  Surgeon: Valdemar Rogue, MD;  Location: [facility] OR;  Service: Ophthalmology;  Laterality: Left;   LAPAROSCOPIC HYSTERECTOMY     total   LAPAROTOMY     Ovarian mass removed  12/06/2011   Borderline Ovarian Cancer   PARS PLANA VITRECTOMY Left 05/16/2023   Procedure: PARS PLANA VITRECTOMY  WITH 25 GAUGE;  Surgeon: Valdemar Rogue, MD;  Location: [facility] OR;  Service: Ophthalmology;  Laterality: Left;   PHOTOCOAGULATION WITH LASER Left 05/16/2023   Procedure: PHOTOCOAGULATION WITH LASER;  Surgeon: Valdemar Rogue, MD;  Location: [facility] OR;  Service: Ophthalmology;  Laterality: Left;   SMALL INTESTINE SURGERY     FAMILY HISTORY Family History  Problem Relation Age of Onset   Hypertension Mother    Diabetes Mother    High blood pressure Mother    Depression Mother    Drug abuse Mother    Obesity Mother    Hypertension Father    Diabetes Father    High Cholesterol Father    Heart disease Father    Cancer Father    Drug abuse Father    Hypertension Maternal Grandmother    Diabetes Maternal Grandmother    Hypertension Maternal Grandfather    Diabetes Maternal Grandfather    Cancer Paternal Grandmother        breast   Hypertension Paternal Grandmother    Diabetes Paternal Grandmother    Hypertension Paternal Grandfather    Diabetes Paternal Grandfather    Rectal cancer Other    Stomach cancer Other    Esophageal cancer Other    Colon polyps Other    Colon cancer Other    SOCIAL HISTORY Social History   Tobacco Use   Smoking status: Never   Smokeless tobacco: Never  Vaping Use   Vaping status: Never Used  Substance Use Topics   Alcohol use: Yes    Comment: cocktails 3 x week   Drug use: No       OPHTHALMIC EXAM:  Base Eye Exam     Visual Acuity (Snellen - Linear)       Right Left   Dist cc 20/20 20/100 +2   Dist ph cc  20/70    Correction: Glasses         Tonometry (Tonopen, 8:12 AM)       Right Left   Pressure 23 21  1  drop of Combigan OU @ 8:13a        Pupils       Dark Light Shape React APD   Right 3 2 Round Brisk None   Left 3 2 Round Brisk None         Visual Fields       Left Right    Full Full         Extraocular Movement       Right Left    Full, Ortho Full, Ortho         Neuro/Psych     Oriented x3: Yes    Mood/Affect: Normal  Dilation     Both eyes: 2.5% Phenylephrine  @ 8:08 AM           Slit Lamp and Fundus Exam     Slit Lamp Exam       Right Left   Lids/Lashes Normal Dermatochalasis - upper lid   Conjunctiva/Sclera mild melanosis mild melanosis   Cornea arcus, trace tear film debris Mild arcus, trace tear film debris, 1+ Punctate epithelial erosions   Anterior Chamber deep, clear, narrow temporal angle Deep, 1+fine cell and pigment   Iris Round and reactive Round and dilated   Lens 1+ Cortical cataract 2-3+ Nuclear sclerosis, 2-3+ Cortical cataract, 1-2+ Posterior subcapsular cataract, fine pigment on anterior capsule   Anterior Vitreous mild syneresis post vitrectomy, mild pigment, gas bubble gone         Fundus Exam       Right Left   Disc Pink and Sharp, mild PPP Pink and Sharp   C/D Ratio 0.6 0.5   Macula Flat, Good foveal reflex, RPE mottling, No heme or edema Flat, Good foveal reflex, mild RPE mottling, mild central cystic changes, mild ERM   Vessels attenuated, mild tortuosity mild attenuation   Periphery Attached, patches of lattice degeneration at 1100 and from 0430-0600, smaller patches of lattice at 0300 and 0730 equator -- good laser changes surrounding all lesions, no new RT/RD or lattice Attached, good laser surrounding large tears SN quad at 1000 and 1100, good 360 peripheral laser, focal fibrosis; PRE-OP: 2 large tears SN quad at 1000 and 1100 with +SRF; no other details visible elsewhere           Refraction     Wearing Rx       Sphere Cylinder Axis Add   Right -6.50 +4.75 094 +2.00   Left -6.25 +4.00 091 +2.00           IMAGING AND PROCEDURES  Imaging and Procedures for 08/20/2023  OCT, Retina - OU - Both Eyes       Right Eye Quality was good. Central Foveal Thickness: 275. Progression has been stable. Findings include normal foveal contour, no IRF, no SRF, vitreomacular adhesion .   Left Eye Quality was good. Central Foveal  Thickness: 317. Progression has been stable. Findings include normal foveal contour, no SRF, intraretinal fluid (SN retina re-attached, persistent vitreous opacities -- improved, persistent central cystic changes / CME).   Notes *Images captured and stored on drive  Diagnosis / Impression:  OD: NFP, no IRF/SRF OS: SN retina stably re-attached, persistent vitreous opacities, persistent central cystic changes / CME  Clinical management:  See below  Abbreviations: NFP - Normal foveal profile. CME - cystoid macular edema. PED - pigment epithelial detachment. IRF - intraretinal fluid. SRF - subretinal fluid. EZ - ellipsoid zone. ERM - epiretinal membrane. ORA - outer retinal atrophy. ORT - outer retinal tubulation. SRHM - subretinal hyper-reflective material. IRHM - intraretinal hyper-reflective material      Injection into Tenon's Capsule - OS - Left Eye       Time Out 08/20/2023. 8:52 AM. Confirmed correct patient, procedure, site, and patient consented.   Anesthesia Topical anesthesia was used. Anesthetic medications included Lidocaine  2%, Proparacaine  0.5%.   Procedure Preparation included 5% betadine to ocular surface. A (25g) needle was used.   Injection: 40 mg triamcinolone  acetonide 40 MG/ML   Route: Other, Site: Left Eye   NDC: [id number], Lot: [id number], Expiration date: 12/04/2024   Post-op Post injection exam found visual acuity of at least counting fingers.  The patient tolerated the procedure well. There were no complications. The patient received written and verbal post procedure care education. Post injection medications included ocuflox.   Notes 1.0 cc of Kenalog -40 (40 mg) injected into subtenon's capsule in the superotemporal quadrant. Betadine was applied to Injection area pre and post-injection then rinsed with sterile BSS. 1 drop of polymixin was instilled into the eye. There were no complications. Pt tolerated procedure well.           ASSESSMENT/PLAN:    ICD-10-CM   1. Left retinal detachment  H33.22 OCT, Retina - OU - Both Eyes    2. Cystoid macular edema of left eye  H35.352 Injection into Tenon's Capsule - OS - Left Eye    triamcinolone  acetonide (KENALOG -40) injection 40 mg    3. Vitreous hemorrhage of left eye (HCC)  H43.12     4. Lattice degeneration of right retina  H35.411     5. Retinal hole of right eye  H33.321     6. Combined forms of age-related cataract of left eye  H25.812      1-3. Rhegmatogenous retinal detachment with vitreous hemorrhage and CME OS  - pt reports progressive vision loss OS w/ onset on Saturday, 10.05.24, green blobs in vision  - denies photopsias  - vision became increasing blurry and presented to ED on Sunday, 10.06.24  - outpt follow up with Dr. Vivian on 10.07.24 - exam 10.8.24 w/ diffuse VH (no view of posterior pole), two large retinal tears at 1000 and 1100 (superonasal periphery) with +SRF - B-scan 10.8.24 ultrasound shows focal peripheral detachment in superonasal quad with tears at 1000 and 1100, matching exam - s/p PPV/PFO/EL/FAX/14% C3F8 OS, 10.09.2024             - doing well -- gas bubble gone             - retina attached and in good position -- good laser around breaks and 360             - IOP OS 21  - OCT OS shows persistent CME / cystic changes -- PF and bromfenac  QID OS started 12.16.24 - BCVA OS 20/70 from 20/100 - recommend STK OS #1 for persistent CME - RBA of procedure discussed, questions answered - informed consent obtained and signed - see procedure note              - continue PF QID OS and start bromfenac  QID OS - recommend staying out of work vs. Sharie duty until post operative healing / recovery is complete             - f/u 4 weeks -- DFE, OCT  3,4. Lattice degeneration w/ atrophic holes, right eye - patches of lattice at 11 and from 0430-0600, smaller patches of lattice at 0300 and 0730 equator - discussed findings, prognosis, and treatment options including  observation - s/p retinopexy OD (10.08.24) -- good laser surrounding all lesions - no new RT/RD or lattice - monitor  5. Mixed Cataract OS - The symptoms of cataract, surgical options, and treatments and risks were discussed with patient. - discussed diagnosis and progression, specifically post-vitrectomy cataract progression - monitor for now  Ophthalmic Meds Ordered this visit:  Meds ordered this encounter  Medications   triamcinolone  acetonide (KENALOG -40) injection 40 mg     Return in about 4 weeks (around 09/17/2023) for f/u RD OS, DFE, OCT.  There are no Patient Instructions on file for this visit.  This  document serves as a record of services personally performed by Redell JUDITHANN Hans, MD, PhD. It was created on their behalf by Alan PARAS. Delores, OA an ophthalmic technician. The creation of this record is the provider's dictation and/or activities during the visit.    Electronically signed by: Alan PARAS. Delores, OA 08/20/23 11:09 PM  Redell JUDITHANN Hans, M.D., Ph.D. Diseases & Surgery of the Retina and Vitreous [facility] 08/20/2023   I have reviewed the above documentation for accuracy and completeness, and I agree with the above. Redell JUDITHANN Hans, M.D., Ph.D. 08/20/23 11:12 PM  Abbreviations: M myopia (nearsighted); A astigmatism; H hyperopia (farsighted); P presbyopia; Mrx spectacle prescription;  CTL contact lenses; OD right eye; OS left eye; OU both eyes  XT exotropia; ET esotropia; PEK punctate epithelial keratitis; PEE punctate epithelial erosions; DES dry eye syndrome; MGD meibomian gland dysfunction; ATs artificial tears; PFAT's preservative free artificial tears; NSC nuclear sclerotic cataract; PSC posterior subcapsular cataract; ERM epi-retinal membrane; PVD posterior vitreous detachment; RD retinal detachment; DM diabetes mellitus; DR diabetic retinopathy; NPDR non-proliferative diabetic retinopathy; PDR proliferative diabetic retinopathy; CSME clinically  significant macular edema; DME diabetic macular edema; dbh dot blot hemorrhages; CWS cotton wool spot; POAG primary open angle glaucoma; C/D cup-to-disc ratio; HVF humphrey visual field; GVF goldmann visual field; OCT optical coherence tomography; IOP intraocular pressure; BRVO Branch retinal vein occlusion; CRVO central retinal vein occlusion; CRAO central retinal artery occlusion; BRAO branch retinal artery occlusion; RT retinal tear; SB scleral buckle; PPV pars plana vitrectomy; VH Vitreous hemorrhage; PRP panretinal laser photocoagulation; IVK intravitreal kenalog ; VMT vitreomacular traction; MH Macular hole;  NVD neovascularization of the disc; NVE neovascularization elsewhere; AREDS age related eye disease study; ARMD age related macular degeneration; POAG primary open angle glaucoma; EBMD epithelial/anterior basement membrane dystrophy; ACIOL anterior chamber intraocular lens; IOL intraocular lens; PCIOL posterior chamber intraocular lens; Phaco/IOL phacoemulsification with intraocular lens placement; PRK photorefractive keratectomy; LASIK laser assisted in situ keratomileusis; HTN hypertension; DM diabetes mellitus; COPD chronic obstructive pulmonary disease

## 2023-08-20 ENCOUNTER — Encounter (INDEPENDENT_AMBULATORY_CARE_PROVIDER_SITE_OTHER): Payer: Self-pay | Admitting: Ophthalmology

## 2023-08-20 ENCOUNTER — Ambulatory Visit (INDEPENDENT_AMBULATORY_CARE_PROVIDER_SITE_OTHER): Payer: BC Managed Care – PPO | Admitting: Ophthalmology

## 2023-08-20 DIAGNOSIS — H4312 Vitreous hemorrhage, left eye: Secondary | ICD-10-CM

## 2023-08-20 DIAGNOSIS — H25812 Combined forms of age-related cataract, left eye: Secondary | ICD-10-CM

## 2023-08-20 DIAGNOSIS — H3322 Serous retinal detachment, left eye: Secondary | ICD-10-CM

## 2023-08-20 DIAGNOSIS — H35411 Lattice degeneration of retina, right eye: Secondary | ICD-10-CM | POA: Diagnosis not present

## 2023-08-20 DIAGNOSIS — H35352 Cystoid macular degeneration, left eye: Secondary | ICD-10-CM

## 2023-08-20 DIAGNOSIS — H33321 Round hole, right eye: Secondary | ICD-10-CM

## 2023-08-20 MED ORDER — TRIAMCINOLONE ACETONIDE 40 MG/ML IJ SUSP FOR KALEIDOSCOPE
40.0000 mg | INTRAMUSCULAR | Status: AC | PRN
  Administered 2023-08-20: 40 mg

## 2023-08-21 ENCOUNTER — Other Ambulatory Visit (HOSPITAL_COMMUNITY): Payer: Self-pay

## 2023-08-24 ENCOUNTER — Other Ambulatory Visit (HOSPITAL_COMMUNITY): Payer: Self-pay

## 2023-09-06 NOTE — Progress Notes (Signed)
 [facility] - Clinic Note  09/17/2023   CHIEF COMPLAINT Patient presents for Retina Follow Up  HISTORY OF PRESENT ILLNESS: Kimberly Richards is a 48 y.o. female who presents to the clinic today for:  HPI     Retina Follow Up   Patient presents with  Other.  In left eye.  Severity is moderate.  Duration of 4 weeks.  Since onset it is stable.  I, the attending physician,  performed the HPI with the patient and updated documentation appropriately.        Comments   4 week Retina eval. Patient states no change noticed in vision      Last edited by Ronelle Coffee, MD on 09/17/2023  3:54 PM.    Pt states vision doesn't seem that much better, she states she feels like the eye is trying to work more, it has started twitching, she did not have any problems after the last injection   Referring physician: Fraz[place], [place], OD [address] [place],  [place] [address]  HISTORICAL INFORMATION:  Selected notes from the MEDICAL RECORD NUMBER Referred by Dr. Micael Adas for concern of RD OS LEE:  Ocular Hx- PMH-   CURRENT MEDICATIONS: Current Outpatient Medications (Ophthalmic Drugs)  Medication Sig   Bromfenac  Sodium 0.07 % SOLN Place 1 drop into the left eye 4 (four) times daily.   prednisoLONE  acetate (PRED FORTE ) 1 % ophthalmic suspension Place 1 drop into the right eye 4 (four) times daily.   No current facility-administered medications for this visit. (Ophthalmic Drugs)   Current Outpatient Medications (Other)  Medication Sig   Azelaic Acid  15 % gel APPLY TOPICALLY TO AFFECTED AREA TWICE DAILY IN THE MORNING AND EVENING AFTER SKIN HAS BEEN THOROUGHLY WASHED AND PATTED DRY   B Complex-C-Folic Acid (HM VITAMIN B COMPLEX/VITAMIN C PO) Take 1 tablet by mouth 2 (two) times daily.   clonazePAM  (KLONOPIN ) 0.5 MG tablet Take 0.5-1 tablets (0.25-0.5 mg total) by mouth 2 (two) times daily as needed for anxiety.   Digestive Enzymes (DIGESTIVE ENZYME PO) Take by mouth  daily at 12 noon.   FLUoxetine  (PROZAC ) 10 MG tablet Take 1 tablet by mouth once daily   fluticasone  (FLONASE ) 50 MCG/ACT nasal spray Place 2 sprays into both nostrils daily.   magnesium gluconate (MAGONATE) 500 MG tablet Take 500 mg by mouth at bedtime.   MELATONIN PO Take by mouth at bedtime. Free Sleep Aid   Multiple Vitamin (MULTIVITAMIN) tablet Take 1 tablet by mouth daily.   NON FORMULARY 2 (two) times daily. Vegan Omega 3   Probiotic Product (FORTIFY PROBIOTIC WOMENS EX ST PO) Take 1 tablet by mouth daily.   spironolactone  (ALDACTONE ) 50 MG tablet Take 1 tablet by mouth once daily   tretinoin  (RETIN-A ) 0.025 % cream Apply topically at bedtime.   VALERIAN PO Take by mouth.   vitamin E 180 MG (400 UNITS) capsule Take 400 Units by mouth daily.   No current facility-administered medications for this visit. (Other)   REVIEW OF SYSTEMS: ROS   Positive for: Neurological, Skin, Eyes Negative for: Constitutional, Gastrointestinal, Genitourinary, Musculoskeletal, HENT, Endocrine, Cardiovascular, Respiratory, Psychiatric, Allergic/Imm, Heme/Lymph Last edited by Leonia Raman, COT on 09/17/2023  7:48 AM.     ALLERGIES Allergies  Allergen Reactions   Stadol [Butorphanol]    Sulfa  Antibiotics     Other reaction(s): gi distress   PAST MEDICAL HISTORY Past Medical History:  Diagnosis Date   Abnormal Pap smear of cervix  Acne    On Aldactone    Anemia    Anxiety with depression 12/18/2017   Cancer (HCC)    Constipation    H/O ovarian cancer    High cholesterol    Hot flashes    HPV (human papilloma virus) anogenital infection    Other hyperlipidemia 07/03/2022   Ovarian cyst    PONV (postoperative nausea and vomiting)    Primary hyperparathyroidism (HCC)    Past Surgical History:  Procedure Laterality Date   ABDOMINAL HYSTERECTOMY     BREAST BIOPSY Right    CESAREAN SECTION     x3   EYE SURGERY Right    office procedure of the right eye   GAS INSERTION Left 05/16/2023    Procedure: INSERTION OF GAS;  Surgeon: Ronelle Coffee, MD;  Location: [facility] OR;  Service: Ophthalmology;  Laterality: Left;   LAPAROSCOPIC HYSTERECTOMY     total   LAPAROTOMY     Ovarian mass removed  12/06/2011   Borderline Ovarian Cancer   PARS PLANA VITRECTOMY Left 05/16/2023   Procedure: PARS PLANA VITRECTOMY WITH 25 GAUGE;  Surgeon: Ronelle Coffee, MD;  Location: [facility] OR;  Service: Ophthalmology;  Laterality: Left;   PHOTOCOAGULATION WITH LASER Left 05/16/2023   Procedure: PHOTOCOAGULATION WITH LASER;  Surgeon: Ronelle Coffee, MD;  Location: [facility] OR;  Service: Ophthalmology;  Laterality: Left;   SMALL INTESTINE SURGERY     FAMILY HISTORY Family History  Problem Relation Age of Onset   Hypertension Mother    Diabetes Mother    High blood pressure Mother    Depression Mother    Drug abuse Mother    Obesity Mother    Hypertension Father    Diabetes Father    High Cholesterol Father    Heart disease Father    Cancer Father    Drug abuse Father    Hypertension Maternal Grandmother    Diabetes Maternal Grandmother    Hypertension Maternal Grandfather    Diabetes Maternal Grandfather    Cancer Paternal Grandmother        breast   Hypertension Paternal Grandmother    Diabetes Paternal Grandmother    Hypertension Paternal Grandfather    Diabetes Paternal Grandfather    Rectal cancer Other    Stomach cancer Other    Esophageal cancer Other    Colon polyps Other    Colon cancer Other    SOCIAL HISTORY Social History   Tobacco Use   Smoking status: Never   Smokeless tobacco: Never  Vaping Use   Vaping status: Never Used  Substance Use Topics   Alcohol use: Yes    Comment: cocktails 3 x week   Drug use: No       OPHTHALMIC EXAM:  Base Eye Exam     Visual Acuity (Snellen - Linear)       Right Left   Dist cc 20/20 20/150   Dist ph cc  20/50    Correction: Glasses         Tonometry (Tonopen, 7:56 AM)       Right Left   Pressure 11 12         Pupils        Dark Light Shape React APD   Right 3 2 Round Brisk None   Left 3 2 Round Brisk None         Visual Fields (Counting fingers)       Left Right    Full Full  Extraocular Movement       Right Left    Full, Ortho Full, Ortho         Neuro/Psych     Oriented x3: Yes   Mood/Affect: Normal         Dilation     Both eyes: 1.0% Mydriacyl , 2.5% Phenylephrine  @ 7:56 AM           Slit Lamp and Fundus Exam     Slit Lamp Exam       Right Left   Lids/Lashes Normal Dermatochalasis - upper lid   Conjunctiva/Sclera mild melanosis mild melanosis, STK ST quad   Cornea arcus, trace tear film debris Mild arcus, trace tear film debris, 1+ Punctate epithelial erosions   Anterior Chamber deep, clear, narrow temporal angle Deep, 2+fine cell and pigment   Iris Round and reactive Round and dilated   Lens 1+ Cortical cataract 2-3+ Nuclear sclerosis, 2-3+ Cortical cataract, 1-2+ Posterior subcapsular cataract, fine pigment on anterior capsule   Anterior Vitreous mild syneresis post vitrectomy, mild pigment, gas bubble gone         Fundus Exam       Right Left   Disc Pink and Sharp, mild PPP Pink and Sharp   C/D Ratio 0.6 0.4   Macula Flat, Good foveal reflex, RPE mottling, No heme or edema Flat, blunted foveal reflex, lamellar hole, mild RPE mottling, mild central cystic changes, mild ERM   Vessels attenuated, mild tortuosity attenuated   Periphery Attached, patches of lattice degeneration at 1100 and from 0430-0600, smaller patches of lattice at 0300 and 0730 equator -- good laser changes surrounding all lesions, no new RT/RD or lattice Attached, good laser surrounding large tears SN quad at 1000 and 1100, good 360 peripheral laser, focal fibrosis; PRE-OP: 2 large tears SN quad at 1000 and 1100 with +SRF; no other details visible elsewhere           Refraction     Wearing Rx       Sphere Cylinder Axis Add   Right -6.50 +4.75 094 +2.00   Left -6.25 +4.00 091 +2.00            IMAGING AND PROCEDURES  Imaging and Procedures for 09/17/2023  OCT, Retina - OU - Both Eyes       Right Eye Quality was good. Central Foveal Thickness: 273. Progression has been stable. Findings include normal foveal contour, no IRF, no SRF, vitreomacular adhesion .   Left Eye Quality was good. Central Foveal Thickness: 285. Progression has improved. Findings include normal foveal contour, no SRF, intraretinal fluid (SN retina re-attached, persistent vitreous opacities -- improved, persistent central cystic changes / CME -- slightly improved).   Notes *Images captured and stored on drive  Diagnosis / Impression:  OD: NFP, no IRF/SRF OS: SN retina stably re-attached, persistent vitreous opacities, persistent central cystic changes / CME -- slightly improved  Clinical management:  See below  Abbreviations: NFP - Normal foveal profile. CME - cystoid macular edema. PED - pigment epithelial detachment. IRF - intraretinal fluid. SRF - subretinal fluid. EZ - ellipsoid zone. ERM - epiretinal membrane. ORA - outer retinal atrophy. ORT - outer retinal tubulation. SRHM - subretinal hyper-reflective material. IRHM - intraretinal hyper-reflective material           ASSESSMENT/PLAN:   ICD-10-CM   1. Left retinal detachment  H33.22     2. Cystoid macular edema of left eye  H35.352 OCT, Retina - OU - Both Eyes  3. Vitreous hemorrhage of left eye (HCC)  H43.12     4. Lattice degeneration of right retina  H35.411     5. Retinal hole of right eye  H33.321     6. Combined forms of age-related cataract of left eye  H25.812      1-3. Rhegmatogenous retinal detachment with vitreous hemorrhage and CME OS  - s/p STK OS #1 (01.13.25)  - pt reports progressive vision loss OS w/ onset on Saturday, 10.05.24, green blobs in vision  - denies photopsias  - vision became increasing blurry and presented to ED on Sunday, 10.06.24  - outpt follow up with Dr. Micael Adas on 10.07.24 - exam  10.8.24 w/ diffuse VH (no view of posterior pole), two large retinal tears at 1000 and 1100 (superonasal periphery) with +SRF - B-scan 10.8.24 ultrasound shows focal peripheral detachment in superonasal quad with tears at 1000 and 1100, matching exam - s/p PPV/PFO/EL/FAX/14% C3F8 OS, 10.09.2024             - doing well -- gas bubble gone             - retina attached and in good position -- good laser around breaks and 360             - IOP OS 12  - OCT OS shows persistent CME / cystic changes -- improved -- PF and bromfenac  QID OS started 12.16.24 - BCVA OS 20/50 from 20/70 - STK informed consent obtained and signed, 01.13.25             - continue PF and bromfenac  QID OS - recommend staying out of work vs. [employer] Cordon duty until post operative healing / recovery is complete             - f/u 6 weeks -- DFE, OCT  3,4. Lattice degeneration w/ atrophic holes, right eye - patches of lattice at 11 and from 0430-0600, smaller patches of lattice at 0300 and 0730 equator - discussed findings, prognosis, and treatment options including observation - s/p retinopexy OD (10.08.24) -- good laser surrounding all lesions - no new RT/RD or lattice - monitor  5. Mixed Cataract OS - The symptoms of cataract, surgical options, and treatments and risks were discussed with patient. - discussed diagnosis and progression, specifically post-vitrectomy cataract progression - will refer back to [facility] / Dr. Carloyn Chi for cataract consult  Ophthalmic Meds Ordered this visit:  Meds ordered this encounter  Medications   prednisoLONE  acetate (PRED FORTE ) 1 % ophthalmic suspension    Sig: Place 1 drop into the right eye 4 (four) times daily.    Dispense:  15 mL    Refill:  0     Return in about 6 weeks (around 10/29/2023) for f/u CME OS, DFE, OCT.  There are no Patient Instructions on file for this visit.  This document serves as a record of services personally performed by Jeanice Millard, MD, PhD. It was  created on their behalf by Morley Arabia. Bevin Bucks, OA an ophthalmic technician. The creation of this record is the provider's dictation and/or activities during the visit.    Electronically signed by: Morley Arabia. Bevin Bucks, OA 09/17/23 3:55 PM  Jeanice Millard, M.D., Ph.D. Diseases & Surgery of the Retina and Vitreous [facility] 09/17/2023   I have reviewed the above documentation for accuracy and completeness, and I agree with the above. Jeanice Millard, M.D., Ph.D. 09/17/23 4:00 PM   Abbreviations: Melven Stable  myopia (nearsighted); A astigmatism; H hyperopia (farsighted); P presbyopia; Mrx spectacle prescription;  CTL contact lenses; OD right eye; OS left eye; OU both eyes  XT exotropia; ET esotropia; PEK punctate epithelial keratitis; PEE punctate epithelial erosions; DES dry eye syndrome; MGD meibomian gland dysfunction; ATs artificial tears; PFAT's preservative free artificial tears; NSC nuclear sclerotic cataract; PSC posterior subcapsular cataract; ERM epi-retinal membrane; PVD posterior vitreous detachment; RD retinal detachment; DM diabetes mellitus; DR diabetic retinopathy; NPDR non-proliferative diabetic retinopathy; PDR proliferative diabetic retinopathy; CSME clinically significant macular edema; DME diabetic macular edema; dbh dot blot hemorrhages; CWS cotton wool spot; POAG primary open angle glaucoma; C/D cup-to-disc ratio; HVF humphrey visual field; GVF goldmann visual field; OCT optical coherence tomography; IOP intraocular pressure; BRVO Branch retinal vein occlusion; CRVO central retinal vein occlusion; CRAO central retinal artery occlusion; BRAO branch retinal artery occlusion; RT retinal tear; SB scleral buckle; PPV pars plana vitrectomy; VH Vitreous hemorrhage; PRP panretinal laser photocoagulation; IVK intravitreal kenalog ; VMT vitreomacular traction; MH Macular hole;  NVD neovascularization of the disc; NVE neovascularization elsewhere; AREDS age related eye disease study; ARMD  age related macular degeneration; POAG primary open angle glaucoma; EBMD epithelial/anterior basement membrane dystrophy; ACIOL anterior chamber intraocular lens; IOL intraocular lens; PCIOL posterior chamber intraocular lens; Phaco/IOL phacoemulsification with intraocular lens placement; PRK photorefractive keratectomy; LASIK laser assisted in situ keratomileusis; HTN hypertension; DM diabetes mellitus; COPD chronic obstructive pulmonary disease

## 2023-09-13 ENCOUNTER — Other Ambulatory Visit (HOSPITAL_COMMUNITY)
Admission: RE | Admit: 2023-09-13 | Discharge: 2023-09-13 | Disposition: A | Discharge status: Home / Self Care | Payer: Self-pay | Source: Ambulatory Visit | Attending: Nephrology | Admitting: Nephrology

## 2023-09-13 DIAGNOSIS — Z006 Encounter for examination for normal comparison and control in clinical research program: Secondary | ICD-10-CM | POA: Insufficient documentation

## 2023-09-17 ENCOUNTER — Encounter (INDEPENDENT_AMBULATORY_CARE_PROVIDER_SITE_OTHER): Payer: Self-pay | Admitting: Ophthalmology

## 2023-09-17 ENCOUNTER — Other Ambulatory Visit: Payer: Self-pay

## 2023-09-17 ENCOUNTER — Ambulatory Visit (INDEPENDENT_AMBULATORY_CARE_PROVIDER_SITE_OTHER): Payer: BC Managed Care – PPO | Admitting: Ophthalmology

## 2023-09-17 DIAGNOSIS — H3322 Serous retinal detachment, left eye: Secondary | ICD-10-CM | POA: Diagnosis not present

## 2023-09-17 DIAGNOSIS — H25812 Combined forms of age-related cataract, left eye: Secondary | ICD-10-CM

## 2023-09-17 DIAGNOSIS — L709 Acne, unspecified: Secondary | ICD-10-CM

## 2023-09-17 DIAGNOSIS — H4312 Vitreous hemorrhage, left eye: Secondary | ICD-10-CM | POA: Diagnosis not present

## 2023-09-17 DIAGNOSIS — H35352 Cystoid macular degeneration, left eye: Secondary | ICD-10-CM | POA: Diagnosis not present

## 2023-09-17 DIAGNOSIS — H35411 Lattice degeneration of retina, right eye: Secondary | ICD-10-CM

## 2023-09-17 DIAGNOSIS — H33321 Round hole, right eye: Secondary | ICD-10-CM

## 2023-09-17 MED ORDER — PREDNISOLONE ACETATE 1 % OP SUSP: 1.0000 [drp] | Freq: Four times a day (QID) | OPHTHALMIC | 0 refills | Status: DC

## 2023-09-17 MED ORDER — AZELAIC ACID 15 % EX GEL: CUTANEOUS | 0 refills | Status: AC

## 2023-09-21 ENCOUNTER — Encounter (INDEPENDENT_AMBULATORY_CARE_PROVIDER_SITE_OTHER): Payer: Self-pay | Admitting: Ophthalmology

## 2023-09-21 ENCOUNTER — Other Ambulatory Visit: Payer: Self-pay | Admitting: Family Medicine

## 2023-09-21 DIAGNOSIS — F418 Other specified anxiety disorders: Secondary | ICD-10-CM

## 2023-09-28 LAB — GENECONNECT MOLECULAR SCREEN: Genetic Analysis Overall Interpretation: NEGATIVE

## 2023-10-09 ENCOUNTER — Encounter: Payer: Self-pay | Admitting: Family Medicine

## 2023-10-09 ENCOUNTER — Ambulatory Visit (INDEPENDENT_AMBULATORY_CARE_PROVIDER_SITE_OTHER): Payer: BC Managed Care – PPO | Admitting: Family Medicine

## 2023-10-09 VITALS — BP 118/70 | HR 85 | Temp 98.5°F | Resp 20 | Ht 64.0 in | Wt 178.8 lb

## 2023-10-09 DIAGNOSIS — E7800 Pure hypercholesterolemia, unspecified: Secondary | ICD-10-CM

## 2023-10-09 DIAGNOSIS — L709 Acne, unspecified: Secondary | ICD-10-CM

## 2023-10-09 DIAGNOSIS — F411 Generalized anxiety disorder: Secondary | ICD-10-CM

## 2023-10-09 LAB — COMPREHENSIVE METABOLIC PANEL
ALT: 23 U/L (ref 0–35)
AST: 21 U/L (ref 0–37)
Albumin: 4.3 g/dL (ref 3.5–5.2)
Alkaline Phosphatase: 68 U/L (ref 39–117)
BUN: 19 mg/dL (ref 6–23)
CO2: 29 meq/L (ref 19–32)
Calcium: 10.9 mg/dL — ABNORMAL HIGH (ref 8.4–10.5)
Chloride: 103 meq/L (ref 96–112)
Creatinine, Ser: 0.81 mg/dL (ref 0.40–1.20)
GFR: 85.38 mL/min (ref >60.00)
Glucose, Bld: 87 mg/dL (ref 70–99)
Potassium: 4.5 meq/L (ref 3.5–5.1)
Sodium: 138 meq/L (ref 135–145)
Total Bilirubin: 0.5 mg/dL (ref 0.2–1.2)
Total Protein: 7.4 g/dL (ref 6.0–8.3)

## 2023-10-09 LAB — LIPID PANEL
Cholesterol: 243 mg/dL — ABNORMAL HIGH (ref 0–200)
HDL: 78.7 mg/dL (ref >39.00)
LDL Cholesterol: 150 mg/dL — ABNORMAL HIGH (ref 0–99)
NonHDL: 163.94
Total CHOL/HDL Ratio: 3
Triglycerides: 70 mg/dL (ref 0.0–149.0)
VLDL: 14 mg/dL (ref 0.0–40.0)

## 2023-10-09 MED ORDER — FLUOXETINE HCL 10 MG PO TABS
10.0000 mg | ORAL_TABLET | Freq: Every day | ORAL | 3 refills | Status: AC
Start: 2023-10-09 — End: ?

## 2023-10-09 NOTE — Patient Instructions (Signed)
Call [facility] [place] at [phone] for an appointment.  They are located at [address], [address], [place], , [address] (right across the hall from our office).  Let us know if you need anything.  

## 2023-10-09 NOTE — Progress Notes (Signed)
 Chief Complaint  Patient presents with   Medical Management of Chronic Issues    Patient presents today for a 6 month follow-up    Subjective Kimberly Richards presents for f/u anxiety/depression.  Pt is currently being treated with Prozac 10 mg/d, Klonopin prn.  Reports doing well since treatment. No thoughts of harming self or others. No self-medication with alcohol, prescription drugs or illicit drugs. Pt is not following with a counselor/psychologist.  Acne Stable on Aldactone 50 mg/d, Retin-A nightly. Compliant, no AE's. Content with current situation. Dairy and maybe Vit B flare her s/s's.   Past Medical History:  Diagnosis Date   Abnormal Pap smear of cervix    Acne    On Aldactone   Anemia    Anxiety with depression 12/18/2017   Cancer (HCC)    Constipation    H/O ovarian cancer    High cholesterol    Hot flashes    HPV (human papilloma virus) anogenital infection    Other hyperlipidemia 07/03/2022   Ovarian cyst    PONV (postoperative nausea and vomiting)    Primary hyperparathyroidism (HCC)    Allergies as of 10/09/2023       Reactions   Stadol [butorphanol]    Sulfa Antibiotics    Other reaction(s): gi distress        Medication List        Accurate as of October 09, 2023 10:31 AM. If you have any questions, ask your nurse or doctor.          STOP taking these medications    MELATONIN PO Stopped by: Jilda Roche Socorro Ebron   VALERIAN PO Stopped by: Jilda Roche Haston Casebolt       TAKE these medications    Azelaic Acid 15 % gel APPLY TOPICALLY TO AFFECTED AREA TWICE DAILY IN THE MORNING AND EVENING AFTER SKIN HAS BEEN THOROUGHLY WASHED AND PATTED DRY   Bromfenac Sodium 0.07 % Soln Place 1 drop into the left eye 4 (four) times daily.   clonazePAM 0.5 MG tablet Commonly known as: KLONOPIN Take 0.5-1 tablets (0.25-0.5 mg total) by mouth 2 (two) times daily as needed for anxiety.   DIGESTIVE ENZYME PO Take by mouth daily at 12 noon.    FLUoxetine 10 MG tablet Commonly known as: PROZAC Take 1 tablet (10 mg total) by mouth daily.   fluticasone 50 MCG/ACT nasal spray Commonly known as: FLONASE Place 2 sprays into both nostrils daily.   FORTIFY PROBIOTIC WOMENS EX ST PO Take 1 tablet by mouth daily.   HM VITAMIN B COMPLEX/VITAMIN C PO Take 1 tablet by mouth once.   magnesium gluconate 500 MG tablet Commonly known as: MAGONATE Take 500 mg by mouth at bedtime.   multivitamin tablet Take 1 tablet by mouth daily.   NON FORMULARY once. Vegan Omega 3   prednisoLONE acetate 1 % ophthalmic suspension Commonly known as: PRED FORTE Place 1 drop into the right eye 4 (four) times daily.   spironolactone 50 MG tablet Commonly known as: ALDACTONE Take 1 tablet by mouth once daily   tretinoin 0.025 % cream Commonly known as: RETIN-A Apply topically at bedtime.   vitamin E 180 MG (400 UNITS) capsule Take 400 Units by mouth daily.        Exam BP 118/70   Pulse 85   Temp 98.5 F (36.9 C)   Resp 20   Ht 5\' 4"  (1.626 m)   Wt 178 lb 12.8 oz (81.1 kg)   SpO2 98%  BMI 30.69 kg/m  General:  well developed, well nourished, in no apparent distress Heart: RRR, no LE edema Skin: Some hyperpigmented macules on cheeks without open comedones or cystic acne Lungs:  CTAB. No respiratory distress Psych: well oriented with normal range of affect and age-appropriate judgement/insight, alert and oriented x4.  Assessment and Plan  GAD (generalized anxiety disorder) - Plan: FLUoxetine (PROZAC) 10 MG tablet  Acne, unspecified acne type  Pure hypercholesterolemia - Plan: Comprehensive metabolic panel, Lipid panel  Chronic, stable. Cont Prozac 10 mg/d, Klonopin prn. Has really not needed prn med.  Chronic, stable. Cont Aldactone 50 mg/d, Retin-A every night. She does not wish to see derm at this time.  F/u in 6 mo. The patient voiced understanding and agreement to the plan.  Jilda Roche Bushnell, DO 10/09/23 10:31  AM

## 2023-10-16 ENCOUNTER — Other Ambulatory Visit: Payer: Self-pay | Admitting: Family Medicine

## 2023-10-16 DIAGNOSIS — L709 Acne, unspecified: Secondary | ICD-10-CM

## 2023-10-18 NOTE — Progress Notes (Signed)
 [facility] - Clinic Note  10/29/2023   CHIEF COMPLAINT Patient presents for Retina Follow Up  HISTORY OF PRESENT ILLNESS: Kimberly Richards is a 48 y.o. female who presents to the clinic today for:  HPI     Retina Follow Up   Patient presents with  Retinal Break/Detachment.  This started 6 weeks ago.  Duration of 6 weeks.  Since onset it is stable.  I, the attending physician,  performed the HPI with the patient and updated documentation appropriately.        Comments   6 week RD OS pt is reporting no vision changes noticed she has some floaters at times denies any flashes she has been having some eye pain in OS denies any watering or itching       Last edited by [name], MD on 10/29/2023 12:35 PM.    Pt states her vision is about the same   Referring physician: [place], [place], OD [address] [place],  [place] [address]  HISTORICAL INFORMATION:  Selected notes from the MEDICAL RECORD NUMBER Referred by Dr. Bascom Levels for concern of RD OS LEE:  Ocular Hx- PMH-   CURRENT MEDICATIONS: Current Outpatient Medications (Ophthalmic Drugs)  Medication Sig   Bromfenac Sodium 0.075 % SOLN Place 1 drop into the left eye 4 (four) times daily.   prednisoLONE acetate (PRED FORTE) 1 % ophthalmic suspension Place 1 drop into the right eye 4 (four) times daily. (Patient taking differently: Place 1 drop into the left eye 3 (three) times daily.)   No current facility-administered medications for this visit. (Ophthalmic Drugs)   Current Outpatient Medications (Other)  Medication Sig   Azelaic Acid 15 % gel APPLY TOPICALLY TO AFFECTED AREA TWICE DAILY IN THE MORNING AND EVENING AFTER SKIN HAS BEEN THOROUGHLY WASHED AND PATTED DRY   B Complex-C-Folic Acid (HM VITAMIN B COMPLEX/VITAMIN C PO) Take 1 tablet by mouth daily.   clonazePAM (KLONOPIN) 0.5 MG tablet Take 0.5-1 tablets (0.25-0.5 mg total) by mouth 2 (two) times daily as needed for anxiety.    FLUoxetine (PROZAC) 10 MG tablet Take 1 tablet (10 mg total) by mouth daily.   MAGNESIUM GLYCINATE PO Take 1 tablet by mouth at bedtime.   Multiple Vitamin (MULTIVITAMIN) tablet Take 1 tablet by mouth daily.   Probiotic Product (FORTIFY PROBIOTIC WOMENS EX ST PO) Take 1 tablet by mouth daily.   spironolactone (ALDACTONE) 50 MG tablet Take 1 tablet by mouth once daily   tretinoin (RETIN-A) 0.025 % cream Apply topically at bedtime.   vitamin E 180 MG (400 UNITS) capsule Take 400 Units by mouth 2 (two) times daily.   No current facility-administered medications for this visit. (Other)   REVIEW OF SYSTEMS: ROS   Positive for: Neurological, Skin, Eyes Negative for: Constitutional, Gastrointestinal, Genitourinary, Musculoskeletal, HENT, Endocrine, Cardiovascular, Respiratory, Psychiatric, Allergic/Imm, Heme/Lymph Last edited by Etheleen Mayhew, COT on 10/29/2023  8:08 AM.      ALLERGIES Allergies  Allergen Reactions   Stadol [Butorphanol]     Hallucinations    Sulfa Antibiotics     Other reaction(s): gi distress   PAST MEDICAL HISTORY Past Medical History:  Diagnosis Date   Abnormal Pap smear of cervix    Acne    On Aldactone   Anemia    Anxiety with depression 12/18/2017   Cancer (HCC)    Constipation    H/O ovarian cancer    High cholesterol    Hot flashes  HPV (human papilloma virus) anogenital infection    Other hyperlipidemia 07/03/2022   Ovarian cyst    PONV (postoperative nausea and vomiting)    Primary hyperparathyroidism (HCC)    Past Surgical History:  Procedure Laterality Date   BREAST BIOPSY Right    CESAREAN SECTION     x3   EYE SURGERY Right    office procedure of the right eye   GAS INSERTION Left 05/16/2023   Procedure: INSERTION OF GAS;  Surgeon: [name], MD;  Location: [facility] OR;  Service: Ophthalmology;  Laterality: Left;   LAPAROSCOPIC HYSTERECTOMY     total   LAPAROTOMY     Ovarian mass removed  12/06/2011   Borderline Ovarian Cancer    PARS PLANA VITRECTOMY Left 05/16/2023   Procedure: PARS PLANA VITRECTOMY WITH 25 GAUGE;  Surgeon: [name], MD;  Location: [facility] OR;  Service: Ophthalmology;  Laterality: Left;   PHOTOCOAGULATION WITH LASER Left 05/16/2023   Procedure: PHOTOCOAGULATION WITH LASER;  Surgeon: [name], MD;  Location: [facility] OR;  Service: Ophthalmology;  Laterality: Left;   SMALL INTESTINE SURGERY     FAMILY HISTORY Family History  Problem Relation Age of Onset   Hypertension Mother    Diabetes Mother    High blood pressure Mother    Depression Mother    Drug abuse Mother    Obesity Mother    Hypertension Father    Diabetes Father    High Cholesterol Father    Heart disease Father    Cancer Father    Drug abuse Father    Hypertension Maternal Grandmother    Diabetes Maternal Grandmother    Hypertension Maternal Grandfather    Diabetes Maternal Grandfather    Cancer Paternal Grandmother        breast   Hypertension Paternal Grandmother    Diabetes Paternal Grandmother    Hypertension Paternal Grandfather    Diabetes Paternal Grandfather    Rectal cancer Other    Stomach cancer Other    Esophageal cancer Other    Colon polyps Other    Colon cancer Other    SOCIAL HISTORY Social History   Tobacco Use   Smoking status: Never   Smokeless tobacco: Never  Vaping Use   Vaping status: Never Used  Substance Use Topics   Alcohol use: Yes    Comment: cocktails 3 x week   Drug use: No       OPHTHALMIC EXAM:  Base Eye Exam     Visual Acuity (Snellen - Linear)       Right Left   Dist cc 20/20 -1 20/400   Dist ph cc  20/100 -1    Correction: Glasses         Tonometry (Tonopen, 8:13 AM)       Right Left   Pressure 19 23         Pupils       Pupils Dark Light Shape React APD   Right PERRL 3 2 Round Brisk None   Left PERRL 3 2 Round Brisk None         Visual Fields       Left Right    Full Full         Extraocular Movement       Right Left    Full, Ortho  Full, Ortho         Neuro/Psych     Oriented x3: Yes   Mood/Affect: Normal         Dilation  Both eyes: 2.5% Phenylephrine @ 8:13 AM           Slit Lamp and Fundus Exam     Slit Lamp Exam       Right Left   Lids/Lashes Normal Dermatochalasis - upper lid   Conjunctiva/Sclera mild melanosis mild melanosis, STK ST quad   Cornea arcus, trace tear film debris Mild arcus, trace tear film debris, 1+ Punctate epithelial erosions   Anterior Chamber deep, clear, narrow temporal angle Deep, 2+fine cell and pigment   Iris Round and reactive Round and dilated   Lens 1+ Cortical cataract 2-3+ Nuclear sclerosis, 2-3+ Cortical cataract, 2-3+ Posterior subcapsular cataract, fine pigment on anterior capsule   Anterior Vitreous mild syneresis post vitrectomy, mild pigment, gas bubble gone         Fundus Exam       Right Left   Disc Pink and Sharp, mild PPP Pink and Sharp   C/D Ratio 0.6 0.4   Macula Flat, Good foveal reflex, RPE mottling, No heme or edema FTMH with surrounding edema, no heme   Vessels attenuated, mild tortuosity attenuated   Periphery Attached, patches of lattice degeneration at 1100 and from 0430-0600, smaller patches of lattice at 0300 and 0730 equator -- good laser changes surrounding all lesions, no new RT/RD or lattice Attached, good laser surrounding large tears SN quad at 1000 and 1100, good 360 peripheral laser, focal fibrosis; PRE-OP: 2 large tears SN quad at 1000 and 1100 with +SRF; no other details visible elsewhere           Refraction     Wearing Rx       Sphere Cylinder Axis Add   Right -6.50 +4.75 094 +2.00   Left -6.25 +4.00 091 +2.00           IMAGING AND PROCEDURES  Imaging and Procedures for 10/29/2023  OCT, Retina - OU - Both Eyes       Right Eye Quality was good. Central Foveal Thickness: 274. Progression has been stable. Findings include normal foveal contour, no IRF, no SRF, vitreomacular adhesion .   Left Eye Quality  was good. Central Foveal Thickness: 421. Progression has worsened. Findings include abnormal foveal contour, intraretinal fluid, macular hole (SN retina re-attached, persistent vitreous opacities -- improved, FTMH with surrounding IRF).   Notes *Images captured and stored on drive  Diagnosis / Impression:  OD: NFP, no IRF/SRF OS: SN retina re-attached, persistent vitreous opacities -- improved, FTMH with surrounding IRF  Clinical management:  See below  Abbreviations: NFP - Normal foveal profile. CME - cystoid macular edema. PED - pigment epithelial detachment. IRF - intraretinal fluid. SRF - subretinal fluid. EZ - ellipsoid zone. ERM - epiretinal membrane. ORA - outer retinal atrophy. ORT - outer retinal tubulation. SRHM - subretinal hyper-reflective material. IRHM - intraretinal hyper-reflective material           ASSESSMENT/PLAN:   ICD-10-CM   1. Full thickness macular hole of left eye  H35.342 OCT, Retina - OU - Both Eyes    2. Left retinal detachment  H33.22     3. Cystoid macular edema of left eye  H35.352 OCT, Retina - OU - Both Eyes    4. Vitreous hemorrhage of left eye (HCC)  H43.12     5. Lattice degeneration of right retina  H35.411     6. Retinal hole of right eye  H33.321     7. Combined forms of age-related cataract of left eye  H25.812  Macular hole, left eye   - OCT shows new full-thickness macular hole OS - The natural history, anatomy, potential for loss of vision, and treatment options including vitrectomy techniques and the complications of endophthalmitis, retinal detachment, vitreous hemorrhage, cataract progression and permanent vision loss discussed with the patient. - recommend PPV w/ membrane peel and gas under general anesthesia - pt wishes to proceed with surgery - RBA of procedure discussed, questions answered - informed consent obtained and signed - surgery scheduled for Thursday, March 27th, 1130am. [facility] OR 8 - f/u Friday, March 28th, DFE  OS only, no OCT  2-4. Rhegmatogenous retinal detachment with vitreous hemorrhage and CME OS  - s/p STK OS #1 (01.13.25)  - pt reports progressive vision loss OS w/ onset on Saturday, 10.05.24, green blobs in vision  - denies photopsias  - vision became increasing blurry and presented to ED on Sunday, 10.06.24  - outpt follow up with Dr. Bascom Levels on 10.07.24 - exam 10.8.24 w/ diffuse VH (no view of posterior pole), two large retinal tears at 1000 and 1100 (superonasal periphery) with +SRF - B-scan 10.8.24 ultrasound shows focal peripheral detachment in superonasal quad with tears at 1000 and 1100, matching exam - s/p PPV/PFO/EL/FAX/14% C3F8 OS, 10.09.2024             - retina attached and in good position -- good laser around breaks and 360             - IOP OS 23  - OCT OS shows SN retina re-attached, persistent vitreous opacities -- improved, FTMH with surrounding IRF - BCVA OS decreased to 20/100 from 20/50 - STK informed consent obtained and signed, 01.13.25             - continue PF and bromfenac QID OS             - f/u 6 weeks -- DFE, OCT  5,6. Lattice degeneration w/ atrophic holes, right eye - patches of lattice at 11 and from 0430-0600, smaller patches of lattice at 0300 and 0730 equator - discussed findings, prognosis, and treatment options including observation - s/p retinopexy OD (10.08.24) -- good laser surrounding all lesions - no new RT/RD or lattice - monitor  7. Mixed Cataract OS - The symptoms of cataract, surgical options, and treatments and risks were discussed with patient. - discussed diagnosis and progression, specifically post-vitrectomy cataract progression - will hold off on surgery until after macular hole repair  Ophthalmic Meds Ordered this visit:  No orders of the defined types were placed in this encounter.    Return in about 4 days (around 11/02/2023) for f/u FTMH OS, DFE OS only.  There are no Patient Instructions on file for this visit.  This  document serves as a record of services personally performed by Karie Chimera, MD, PhD. It was created on their behalf by Glee Arvin. Manson Passey, OA an ophthalmic technician. The creation of this record is the provider's dictation and/or activities during the visit.    Electronically signed by: Glee Arvin. Kristopher Oppenheim 10/29/23 12:36 PM  Karie Chimera, M.D., Ph.D. Diseases & Surgery of the Retina and Vitreous [facility] 10/29/2023   I have reviewed the above documentation for accuracy and completeness, and I agree with the above. Karie Chimera, M.D., Ph.D. 10/29/23 12:37 PM   Abbreviations: M myopia (nearsighted); A astigmatism; H hyperopia (farsighted); P presbyopia; Mrx spectacle prescription;  CTL contact lenses; OD right eye; OS left eye; OU both  eyes  XT exotropia; ET esotropia; PEK punctate epithelial keratitis; PEE punctate epithelial erosions; DES dry eye syndrome; MGD meibomian gland dysfunction; ATs artificial tears; PFAT's preservative free artificial tears; NSC nuclear sclerotic cataract; PSC posterior subcapsular cataract; ERM epi-retinal membrane; PVD posterior vitreous detachment; RD retinal detachment; DM diabetes mellitus; DR diabetic retinopathy; NPDR non-proliferative diabetic retinopathy; PDR proliferative diabetic retinopathy; CSME clinically significant macular edema; DME diabetic macular edema; dbh dot blot hemorrhages; CWS cotton wool spot; POAG primary open angle glaucoma; C/D cup-to-disc ratio; HVF humphrey visual field; GVF goldmann visual field; OCT optical coherence tomography; IOP intraocular pressure; BRVO Branch retinal vein occlusion; CRVO central retinal vein occlusion; CRAO central retinal artery occlusion; BRAO branch retinal artery occlusion; RT retinal tear; SB scleral buckle; PPV pars plana vitrectomy; VH Vitreous hemorrhage; PRP panretinal laser photocoagulation; IVK intravitreal kenalog; VMT vitreomacular traction; MH Macular hole;  NVD  neovascularization of the disc; NVE neovascularization elsewhere; AREDS age related eye disease study; ARMD age related macular degeneration; POAG primary open angle glaucoma; EBMD epithelial/anterior basement membrane dystrophy; ACIOL anterior chamber intraocular lens; IOL intraocular lens; PCIOL posterior chamber intraocular lens; Phaco/IOL phacoemulsification with intraocular lens placement; PRK photorefractive keratectomy; LASIK laser assisted in situ keratomileusis; HTN hypertension; DM diabetes mellitus; COPD chronic obstructive pulmonary disease

## 2023-10-29 ENCOUNTER — Encounter (INDEPENDENT_AMBULATORY_CARE_PROVIDER_SITE_OTHER): Payer: Self-pay | Admitting: Ophthalmology

## 2023-10-29 ENCOUNTER — Ambulatory Visit (INDEPENDENT_AMBULATORY_CARE_PROVIDER_SITE_OTHER): Payer: BC Managed Care – PPO | Admitting: Ophthalmology

## 2023-10-29 DIAGNOSIS — H33321 Round hole, right eye: Secondary | ICD-10-CM

## 2023-10-29 DIAGNOSIS — H35342 Macular cyst, hole, or pseudohole, left eye: Secondary | ICD-10-CM | POA: Diagnosis not present

## 2023-10-29 DIAGNOSIS — H35411 Lattice degeneration of retina, right eye: Secondary | ICD-10-CM

## 2023-10-29 DIAGNOSIS — H4312 Vitreous hemorrhage, left eye: Secondary | ICD-10-CM

## 2023-10-29 DIAGNOSIS — H3322 Serous retinal detachment, left eye: Secondary | ICD-10-CM

## 2023-10-29 DIAGNOSIS — H35352 Cystoid macular degeneration, left eye: Secondary | ICD-10-CM | POA: Diagnosis not present

## 2023-10-29 DIAGNOSIS — H25812 Combined forms of age-related cataract, left eye: Secondary | ICD-10-CM

## 2023-10-30 ENCOUNTER — Encounter (HOSPITAL_COMMUNITY): Payer: Self-pay | Admitting: Ophthalmology

## 2023-10-30 ENCOUNTER — Other Ambulatory Visit: Payer: Self-pay

## 2023-10-30 NOTE — H&P (Signed)
 Kimberly Richards is an 48 y.o. female.    Chief Complaint: macular hole, LEFT EYE  HPI: Pt with history of retinal detachment OS s/p PPV w/ endolaser and gas on 10.09.25, presents for routine retina follow up with decreased vision. On dilated exam and OCT, pt has developed a new-onset, full-thickness macular hole OS. After a discussion the findings, and risks benefits and alternatives to surgery, the patient has elected to proceed with surgical repair of the macular hole, under general anesthesia.  Past Medical History:  Diagnosis Date   Abnormal Pap smear of cervix    Acne    On Aldactone   Anemia    Anxiety with depression 12/18/2017   Cancer (HCC)    Constipation    H/O ovarian cancer    High cholesterol    Hot flashes    HPV (human papilloma virus) anogenital infection    Other hyperlipidemia 07/03/2022   Ovarian cyst    PONV (postoperative nausea and vomiting)    Primary hyperparathyroidism (HCC)     Past Surgical History:  Procedure Laterality Date   BREAST BIOPSY Right    CESAREAN SECTION     x3   EYE SURGERY Right    office procedure of the right eye   GAS INSERTION Left 05/16/2023   Procedure: INSERTION OF GAS;  Surgeon: [name], MD;  Location: [facility] OR;  Service: Ophthalmology;  Laterality: Left;   LAPAROSCOPIC HYSTERECTOMY     total   LAPAROTOMY     Ovarian mass removed  12/06/2011   Borderline Ovarian Cancer   PARS PLANA VITRECTOMY Left 05/16/2023   Procedure: PARS PLANA VITRECTOMY WITH 25 GAUGE;  Surgeon: [name], MD;  Location: [facility] OR;  Service: Ophthalmology;  Laterality: Left;   PHOTOCOAGULATION WITH LASER Left 05/16/2023   Procedure: PHOTOCOAGULATION WITH LASER;  Surgeon: [name], MD;  Location: [facility] OR;  Service: Ophthalmology;  Laterality: Left;   SMALL INTESTINE SURGERY      Family History  Problem Relation Age of Onset   Hypertension Mother    Diabetes Mother    High blood pressure Mother    Depression Mother    Drug abuse Mother     Obesity Mother    Hypertension Father    Diabetes Father    High Cholesterol Father    Heart disease Father    Cancer Father    Drug abuse Father    Hypertension Maternal Grandmother    Diabetes Maternal Grandmother    Hypertension Maternal Grandfather    Diabetes Maternal Grandfather    Cancer Paternal Grandmother        breast   Hypertension Paternal Grandmother    Diabetes Paternal Grandmother    Hypertension Paternal Grandfather    Diabetes Paternal Grandfather    Rectal cancer Other    Stomach cancer Other    Esophageal cancer Other    Colon polyps Other    Colon cancer Other    Social History:  reports that she has never smoked. She has never used smokeless tobacco. She reports current alcohol use. She reports that she does not use drugs.  Allergies:  Allergies  Allergen Reactions   Stadol [Butorphanol]     Hallucinations    Sulfa Antibiotics     Other reaction(s): gi distress    No medications prior to admission.    Review of systems otherwise negative  There were no vitals taken for this visit.  Physical exam: Mental status: oriented x3. Eyes: See eye exam associated with  this date of surgery Ears, Nose, Throat: within normal limits Neck: Within Normal limits General: within normal limits Chest: Within normal limits Breast: deferred Heart: Within normal limits Abdomen: Within normal limits GU: deferred Extremities: within normal limits Skin: within normal limits  Assessment/Plan 1. Macular hole, LEFT EYE  Plan: To [facility] for 25g PPV w/ ILM peel and gas OS under general anesthesia. - case scheduled for Thursday, 3.27.25, at 1130 am, [facility] OR 08  Karie Chimera, M.D., Ph.D. Vitreoretinal Surgeon [facility]

## 2023-10-30 NOTE — Progress Notes (Signed)
 SDW CALL  Patient was given pre-op instructions over the phone. The opportunity was given for the patient to ask questions. No further questions asked. Patient verbalized understanding of instructions given.   PCP - Jilda Roche [place] Cardiologist - denies  PPM/ICD - denies Device Orders -  Rep Notified -   Chest x-ray - na EKG - 06/13/22 Stress Test - denies ECHO - denies Cardiac Cath - denies  Sleep Study - denies CPAP -   Fasting Blood Sugar - na Checks Blood Sugar _____ times a day  Blood Thinner Instructions:na Aspirin Instructions:na  ERAS Protcol -clears until 0830 PRE-SURGERY Ensure or G2-   COVID TEST- na   Anesthesia review: no  Patient denies shortness of breath, fever, cough and chest pain over the phone call    Surgical Instructions    Your procedure is scheduled on March 27  Report to [facility] Main Entrance "A" at 0900 A.M., then check in with the Admitting office.  Call this number if you have problems the morning of surgery:  [phone]    Remember:  Do not eat after midnight the night before your surgery  You may drink clear liquids until 0830 the morning of your surgery.   Clear liquids allowed are: Water, Non-Citrus Juices (without pulp), Carbonated Beverages, Clear Tea, Black Coffee ONLY (NO MILK, CREAM OR POWDERED CREAMER of any kind), and Gatorade   Take these medicines the morning of surgery with A SIP OF WATER: Klonopin prn   As of today, STOP taking any Aspirin (unless otherwise instructed by your surgeon) Aleve, Naproxen, Ibuprofen, Motrin, Advil, Goody's, BC's, all herbal medications, fish oil, and all vitamins.  [facility] is not responsible for any belongings or valuables. .   Do NOT Smoke (Tobacco/Vaping)  24 hours prior to your procedure  If you use a CPAP at night, you may bring your mask for your overnight stay.   Contacts, glasses, hearing aids, dentures or partials may not be worn into surgery, please bring  cases for these belongings   Patients discharged the day of surgery will not be allowed to drive home, and someone needs to stay with them for 24 hours.   SURGICAL WAITING ROOM VISITATION You may have 1 visitor in the pre-op area at a time determined by the pre-op nurse. (Visitor may not switch out)  Please refer to the [employer] website for the visitor guidelines for Inpatients (after your surgery is over and you are in a regular room).     Special instructions:    Oral Hygiene is also important to reduce your risk of infection.  Remember - BRUSH YOUR TEETH THE MORNING OF SURGERY WITH YOUR REGULAR TOOTHPASTE   Day of Surgery:  Take a shower the day of or night before with antibacterial soap. Wear Clean/Comfortable clothing the morning of surgery Do not apply any deodorants/lotions.   Do not wear jewelry or makeup Do not wear lotions, powders, perfumes/colognes, or deodorant. Do not shave 48 hours prior to surgery.  Men may shave face and neck. Do not bring valuables to the hospital. Do not wear nail polish, gel polish, artificial nails, or any other type of covering on natural nails (fingers and toes) If you have artificial nails or gel coating that need to be removed by a nail salon, please have this removed prior to surgery. Artificial nails or gel coating may interfere with anesthesia's ability to adequately monitor your vital signs. Remember to brush your teeth WITH YOUR REGULAR TOOTHPASTE.

## 2023-10-31 NOTE — Progress Notes (Signed)
 [facility] - Clinic Note  11/02/2023   CHIEF COMPLAINT Patient presents for Retina Follow Up  HISTORY OF PRESENT ILLNESS: Kimberly Richards is a 48 y.o. female who presents to the clinic today for:  HPI     Retina Follow Up   In left eye.  This started 1 day ago.  Duration of 1 day.  Since onset it is stable.  I, the attending physician,  performed the HPI with the patient and updated documentation appropriately.        Comments   1 day POV macular hole OS pt is reporting little pain last night pt slept with her head down last night       Last edited by [name], MD on 11/04/2023  3:47 PM.    Pt states her eye is a little "stingy" this morning, last night was okay   Referring physician: Frazier, [place], OD [address] [place],  [place] [address]  HISTORICAL INFORMATION:  Selected notes from the MEDICAL RECORD NUMBER Referred by Dr. Bascom Levels for concern of RD OS LEE:  Ocular Hx- PMH-   CURRENT MEDICATIONS: Current Outpatient Medications (Ophthalmic Drugs)  Medication Sig   Bromfenac Sodium 0.075 % SOLN Place 1 drop into the left eye 4 (four) times daily.   prednisoLONE acetate (PRED FORTE) 1 % ophthalmic suspension Place 1 drop into the right eye 4 (four) times daily. (Patient taking differently: Place 1 drop into the left eye 3 (three) times daily.)   No current facility-administered medications for this visit. (Ophthalmic Drugs)   Current Outpatient Medications (Other)  Medication Sig   Azelaic Acid 15 % gel APPLY TOPICALLY TO AFFECTED AREA TWICE DAILY IN THE MORNING AND EVENING AFTER SKIN HAS BEEN THOROUGHLY WASHED AND PATTED DRY   B Complex-C-Folic Acid (HM VITAMIN B COMPLEX/VITAMIN C PO) Take 1 tablet by mouth daily.   clonazePAM (KLONOPIN) 0.5 MG tablet Take 0.5-1 tablets (0.25-0.5 mg total) by mouth 2 (two) times daily as needed for anxiety.   FLUoxetine (PROZAC) 10 MG tablet Take 1 tablet (10 mg total) by mouth daily.    MAGNESIUM GLYCINATE PO Take 1 tablet by mouth at bedtime.   Multiple Vitamin (MULTIVITAMIN) tablet Take 1 tablet by mouth daily.   Probiotic Product (FORTIFY PROBIOTIC WOMENS EX ST PO) Take 1 tablet by mouth daily.   spironolactone (ALDACTONE) 50 MG tablet Take 1 tablet by mouth once daily   tretinoin (RETIN-A) 0.025 % cream Apply topically at bedtime.   vitamin E 180 MG (400 UNITS) capsule Take 400 Units by mouth 2 (two) times daily.   No current facility-administered medications for this visit. (Other)   REVIEW OF SYSTEMS: ROS   Positive for: Neurological, Skin, Eyes Negative for: Constitutional, Gastrointestinal, Genitourinary, Musculoskeletal, HENT, Endocrine, Cardiovascular, Respiratory, Psychiatric, Allergic/Imm, Heme/Lymph Last edited by Etheleen Mayhew, COT on 11/02/2023  7:51 AM.       ALLERGIES Allergies  Allergen Reactions   Stadol [Butorphanol]     Hallucinations    Sulfa Antibiotics     Other reaction(s): gi distress   PAST MEDICAL HISTORY Past Medical History:  Diagnosis Date   Abnormal Pap smear of cervix    Acne    On Aldactone   Anemia    Anxiety with depression 12/18/2017   Cancer (HCC)    Constipation    H/O ovarian cancer    High cholesterol    Hot flashes    HPV (human papilloma virus) anogenital infection  Other hyperlipidemia 07/03/2022   Ovarian cyst    PONV (postoperative nausea and vomiting)    Primary hyperparathyroidism (HCC)    Past Surgical History:  Procedure Laterality Date   BREAST BIOPSY Right    CESAREAN SECTION     x3   EYE SURGERY Right    office procedure of the right eye   GAS INSERTION Left 05/16/2023   Procedure: INSERTION OF GAS;  Surgeon: [name], MD;  Location: [facility] OR;  Service: Ophthalmology;  Laterality: Left;   LAPAROSCOPIC HYSTERECTOMY     total   LAPAROTOMY     Ovarian mass removed  12/06/2011   Borderline Ovarian Cancer   PARS PLANA VITRECTOMY Left 05/16/2023   Procedure: PARS PLANA VITRECTOMY  WITH 25 GAUGE;  Surgeon: [name], MD;  Location: [facility] OR;  Service: Ophthalmology;  Laterality: Left;   PARS PLANA VITRECTOMY 27 GAUGE WITH 25 GAUGE PORT Left 11/01/2023   Procedure: PARS PLANA VITRECTOMY FOR MACULAR HOLE WITH LASER AND INTRAOCULAR TAMPONADE; INSERTION OF GAS; MEMBRANE PEEL;  Surgeon: [name], MD;  Location: [facility] OR;  Service: Ophthalmology;  Laterality: Left;   PHOTOCOAGULATION WITH LASER Left 05/16/2023   Procedure: PHOTOCOAGULATION WITH LASER;  Surgeon: [name], MD;  Location: [facility] OR;  Service: Ophthalmology;  Laterality: Left;   SMALL INTESTINE SURGERY     FAMILY HISTORY Family History  Problem Relation Age of Onset   Hypertension Mother    Diabetes Mother    High blood pressure Mother    Depression Mother    Drug abuse Mother    Obesity Mother    Hypertension Father    Diabetes Father    High Cholesterol Father    Heart disease Father    Cancer Father    Drug abuse Father    Hypertension Maternal Grandmother    Diabetes Maternal Grandmother    Hypertension Maternal Grandfather    Diabetes Maternal Grandfather    Cancer Paternal Grandmother        breast   Hypertension Paternal Grandmother    Diabetes Paternal Grandmother    Hypertension Paternal Grandfather    Diabetes Paternal Grandfather    Rectal cancer Other    Stomach cancer Other    Esophageal cancer Other    Colon polyps Other    Colon cancer Other    SOCIAL HISTORY Social History   Tobacco Use   Smoking status: Never   Smokeless tobacco: Never  Vaping Use   Vaping status: Never Used  Substance Use Topics   Alcohol use: Yes    Comment: cocktails 3 x week   Drug use: No       OPHTHALMIC EXAM:  Base Eye Exam     Visual Acuity (Snellen - Linear)       Right Left   Dist cc 20/20 -2 HM   Dist ph cc  NI    Correction: Glasses         Tonometry (Tonopen, 7:56 AM)       Right Left   Pressure 16 16         Pupils       Dark Light Shape React APD   Right 3 2  Round Brisk None   Left dilated             Visual Fields       Left Right     Full   Restrictions Total superior temporal, inferior temporal, superior nasal, inferior nasal deficiencies  Extraocular Movement       Right Left    Full, Ortho Full, Ortho         Neuro/Psych     Oriented x3: Yes   Mood/Affect: Normal         Dilation     Left eye: 2.5% Phenylephrine @ 7:56 AM           Slit Lamp and Fundus Exam     Slit Lamp Exam       Right Left   Lids/Lashes Normal Dermatochalasis - upper lid   Conjunctiva/Sclera mild melanosis mild melanosis, Subconjunctival hemorrhage, sutures intact   Cornea arcus, trace tear film debris Mild arcus, trace PEE   Anterior Chamber deep, clear, narrow temporal angle deep and clear, no cell or flare   Iris Round and reactive Round and dilated   Lens 1+ Cortical cataract 2-3+ Nuclear sclerosis, 2-3+ Cortical cataract, 3+ Posterior subcapsular cataract, fine pigment on anterior capsule   Anterior Vitreous mild syneresis post vitrectomy, , good gas fill         Fundus Exam       Right Left   Disc Pink and Sharp, mild PPP Pink and Sharp   C/D Ratio 0.6 0.4   Macula Flat, Good foveal reflex, RPE mottling, No heme or edema hazy view, retina attached under gas, good laser changes ST macula   Vessels attenuated, mild tortuosity attenuated   Periphery Attached, patches of lattice degeneration at 1100 and from 0430-0600, smaller patches of lattice at 0300 and 0730 equator -- good laser changes surrounding all lesions, no new RT/RD or lattice Attached, new row of supplemental laser posterior to previous laser, good laser surrounding large tears SN quad at 1000 and 1100, good 360 peripheral laser, focal fibrosis; PRE-OP: 2 large tears SN quad at 1000 and 1100 with +SRF; no other details visible elsewhere           IMAGING AND PROCEDURES  Imaging and Procedures for 11/02/2023         ASSESSMENT/PLAN:   ICD-10-CM    1. Full thickness macular hole of left eye  H35.342     2. Left retinal detachment  H33.22     3. Cystoid macular edema of left eye  H35.352     4. Vitreous hemorrhage of left eye (HCC)  H43.12     5. Lattice degeneration of right retina  H35.411     6. Retinal hole of right eye  H33.321     7. Combined forms of age-related cataract of left eye  H25.812      Macular hole, left eye   - OCT shows new full-thickness macular hole OS - s/p POD1 s/p PPV/TissueBlue/MP/14% C3F8 OS, 03.27.25             - doing well this morning             - retina attached and good gas bubble in place w/ mac hole closing             - IOP okay at 16             - start  PF 4x/day OS                          zymaxid QID OS                          Atropine BID OS  PSO ung QID OS              - cont face down positioning x7 days then can decrease positioning to 50% of time; avoid laying flat on back              - eye shield when sleeping              - post op drop and positioning instructions reviewed              - tylenol/ibuprofen for pain  - f/u next Friday am, DFE OS only, no OCT  2-4. Rhegmatogenous retinal detachment with vitreous hemorrhage and CME OS  - s/p STK OS #1 (01.13.25)  - pt reports progressive vision loss OS w/ onset on Saturday, 10.05.24, green blobs in vision  - denies photopsias  - vision became increasing blurry and presented to ED on Sunday, 10.06.24  - outpt follow up with Dr. Bascom Levels on 10.07.24 - exam 10.8.24 w/ diffuse VH (no view of posterior pole), two large retinal tears at 1000 and 1100 (superonasal periphery) with +SRF - B-scan 10.8.24 ultrasound shows focal peripheral detachment in superonasal quad with tears at 1000 and 1100, matching exam - s/p PPV/PFO/EL/FAX/14% C3F8 OS, 10.09.2024             - retina attached and in good position -- good laser around breaks and 360             - IOP OS 16  5,6. Lattice degeneration w/ atrophic holes,  right eye - patches of lattice at 11 and from 0430-0600, smaller patches of lattice at 0300 and 0730 equator - discussed findings, prognosis, and treatment options including observation - s/p retinopexy OD (10.08.24) -- good laser surrounding all lesions - no new RT/RD or lattice - monitor  7. Mixed Cataract OS - The symptoms of cataract, surgical options, and treatments and risks were discussed with patient. - discussed diagnosis and progression, specifically post-vitrectomy cataract progression - will hold off on surgery until after macular hole repair  Ophthalmic Meds Ordered this visit:  No orders of the defined types were placed in this encounter.    Return in about 1 week (around 11/09/2023) for f/u FTMH OS, DFE OS only.  There are no Patient Instructions on file for this visit.  This document serves as a record of services personally performed by Karie Chimera, MD, PhD. It was created on their behalf by Glee Arvin. Manson Passey, OA an ophthalmic technician. The creation of this record is the provider's dictation and/or activities during the visit.    Electronically signed by: Glee Arvin. Manson Passey, OA 11/04/23 3:47 PM  Karie Chimera, M.D., Ph.D. Diseases & Surgery of the Retina and Vitreous [facility] 11/02/2023   I have reviewed the above documentation for accuracy and completeness, and I agree with the above. Karie Chimera, M.D., Ph.D. 11/04/23 3:54 PM   Abbreviations: M myopia (nearsighted); A astigmatism; H hyperopia (farsighted); P presbyopia; Mrx spectacle prescription;  CTL contact lenses; OD right eye; OS left eye; OU both eyes  XT exotropia; ET esotropia; PEK punctate epithelial keratitis; PEE punctate epithelial erosions; DES dry eye syndrome; MGD meibomian gland dysfunction; ATs artificial tears; PFAT's preservative free artificial tears; NSC nuclear sclerotic cataract; PSC posterior subcapsular cataract; ERM epi-retinal membrane; PVD posterior vitreous  detachment; RD retinal detachment; DM diabetes mellitus; DR diabetic retinopathy; NPDR non-proliferative diabetic retinopathy; PDR proliferative diabetic retinopathy; CSME clinically significant macular edema; DME  diabetic macular edema; dbh dot blot hemorrhages; CWS cotton wool spot; POAG primary open angle glaucoma; C/D cup-to-disc ratio; HVF humphrey visual field; GVF goldmann visual field; OCT optical coherence tomography; IOP intraocular pressure; BRVO Branch retinal vein occlusion; CRVO central retinal vein occlusion; CRAO central retinal artery occlusion; BRAO branch retinal artery occlusion; RT retinal tear; SB scleral buckle; PPV pars plana vitrectomy; VH Vitreous hemorrhage; PRP panretinal laser photocoagulation; IVK intravitreal kenalog; VMT vitreomacular traction; MH Macular hole;  NVD neovascularization of the disc; NVE neovascularization elsewhere; AREDS age related eye disease study; ARMD age related macular degeneration; POAG primary open angle glaucoma; EBMD epithelial/anterior basement membrane dystrophy; ACIOL anterior chamber intraocular lens; IOL intraocular lens; PCIOL posterior chamber intraocular lens; Phaco/IOL phacoemulsification with intraocular lens placement; PRK photorefractive keratectomy; LASIK laser assisted in situ keratomileusis; HTN hypertension; DM diabetes mellitus; COPD chronic obstructive pulmonary disease

## 2023-10-31 NOTE — Progress Notes (Signed)
 Patient was called to be informed that the surgery time for tomorrow was changed to 11:00 o'clock. Patient wasn't available and this Clinical research associate called patient's husband. Husband was informed that the patient must be at the hospital tomorrow at 08:30 o'clock and stop drinking clear liquids at 08:00 o'clock. Husband verbalized understanding.

## 2023-11-01 ENCOUNTER — Ambulatory Visit (HOSPITAL_COMMUNITY)
Admission: RE | Admit: 2023-11-01 | Discharge: 2023-11-01 | Disposition: A | Discharge status: Home / Self Care | Attending: Ophthalmology | Admitting: Ophthalmology

## 2023-11-01 ENCOUNTER — Ambulatory Visit (HOSPITAL_COMMUNITY): Payer: Self-pay | Admitting: Anesthesiology

## 2023-11-01 ENCOUNTER — Encounter (HOSPITAL_COMMUNITY)
Admission: RE | Disposition: A | Discharge status: Home / Self Care | Payer: Self-pay | Source: Home / Self Care | Attending: Ophthalmology

## 2023-11-01 ENCOUNTER — Encounter (HOSPITAL_COMMUNITY): Payer: Self-pay | Admitting: Ophthalmology

## 2023-11-01 DIAGNOSIS — H35342 Macular cyst, hole, or pseudohole, left eye: Secondary | ICD-10-CM | POA: Diagnosis not present

## 2023-11-01 DIAGNOSIS — H3589 Other specified retinal disorders: Secondary | ICD-10-CM | POA: Diagnosis not present

## 2023-11-01 LAB — CBC
HCT: 48.9 % — ABNORMAL HIGH (ref 36.0–46.0)
Hemoglobin: 15.9 g/dL — ABNORMAL HIGH (ref 12.0–15.0)
MCH: 29.9 pg (ref 26.0–34.0)
MCHC: 32.5 g/dL (ref 30.0–36.0)
MCV: 91.9 fL (ref 80.0–100.0)
Platelets: 228 10*3/uL (ref 150–400)
RBC: 5.32 MIL/uL — ABNORMAL HIGH (ref 3.87–5.11)
RDW: 12.8 % (ref 11.5–15.5)
WBC: 2.9 10*3/uL — ABNORMAL LOW (ref 4.0–10.5)
nRBC: 0 % (ref 0.0–0.2)

## 2023-11-01 SURGERY — PARS PLANA VITRECTOMY  FOR MACULAR HOLE WITH LASER AND INTRAOCULAR TAMPONADE
Anesthesia: General | Site: Eye | Laterality: Left

## 2023-11-01 MED ORDER — TRIAMCINOLONE ACETONIDE 40 MG/ML IJ SUSP
INTRAMUSCULAR | Status: AC
  Filled 2023-11-01: qty 5

## 2023-11-01 MED ORDER — BSS IO SOLN
INTRAOCULAR | Status: DC | PRN
  Administered 2023-11-01: 15 mL via INTRAOCULAR

## 2023-11-01 MED ORDER — EPINEPHRINE PF 1 MG/ML IJ SOLN
INTRAMUSCULAR | Status: DC | PRN
  Administered 2023-11-01: .3 mg

## 2023-11-01 MED ORDER — DORZOLAMIDE HCL-TIMOLOL MAL 2-0.5 % OP SOLN
OPHTHALMIC | Status: AC
  Filled 2023-11-01: qty 10

## 2023-11-01 MED ORDER — STERILE WATER FOR INJECTION IJ SOLN
INTRAMUSCULAR | Status: DC | PRN
  Administered 2023-11-01: 20 mL

## 2023-11-01 MED ORDER — ONDANSETRON HCL 4 MG/2ML IJ SOLN
INTRAMUSCULAR | Status: DC | PRN
  Administered 2023-11-01: 4 mg via INTRAVENOUS

## 2023-11-01 MED ORDER — CEFTAZIDIME 1 G IJ SOLR
INTRAMUSCULAR | Status: AC
  Filled 2023-11-01: qty 1

## 2023-11-01 MED ORDER — NA CHONDROIT SULF-NA HYALURON 40-30 MG/ML IO SOSY
INTRAOCULAR | Status: DC | PRN
  Administered 2023-11-01: .5 mL via INTRAOCULAR

## 2023-11-01 MED ORDER — EPINEPHRINE PF 1 MG/ML IJ SOLN
INTRAMUSCULAR | Status: AC
  Filled 2023-11-01: qty 1

## 2023-11-01 MED ORDER — BRIMONIDINE TARTRATE 0.2 % OP SOLN
OPHTHALMIC | Status: DC | PRN
  Administered 2023-11-01: 1 [drp] via OPHTHALMIC

## 2023-11-01 MED ORDER — ONDANSETRON HCL 4 MG/2ML IJ SOLN
INTRAMUSCULAR | Status: AC
Start: 2023-11-01 — End: ?
  Filled 2023-11-01: qty 2

## 2023-11-01 MED ORDER — PHENYLEPHRINE 80 MCG/ML (10ML) SYRINGE FOR IV PUSH (FOR BLOOD PRESSURE SUPPORT)
PREFILLED_SYRINGE | INTRAVENOUS | Status: AC
  Filled 2023-11-01: qty 10

## 2023-11-01 MED ORDER — ATROPINE SULFATE 1 % OP SOLN
1.0000 [drp] | OPHTHALMIC | Status: AC | PRN
  Administered 2023-11-01 (×3): 1 [drp] via OPHTHALMIC
  Filled 2023-11-01: qty 2

## 2023-11-01 MED ORDER — ATROPINE SULFATE 1 % OP SOLN
OPHTHALMIC | Status: AC
  Filled 2023-11-01: qty 5

## 2023-11-01 MED ORDER — SCOPOLAMINE 1 MG/3DAYS TD PT72
1.0000 | MEDICATED_PATCH | TRANSDERMAL | Status: DC
  Administered 2023-11-01: 1.5 mg via TRANSDERMAL
  Filled 2023-11-01: qty 1

## 2023-11-01 MED ORDER — BRILLIANT BLUE G 0.025 % IO SOSY
0.5000 mL | PREFILLED_SYRINGE | INTRAOCULAR | Status: AC
  Administered 2023-11-01: .5 mL via INTRAVITREAL
  Filled 2023-11-01: qty 0.5

## 2023-11-01 MED ORDER — FENTANYL CITRATE (PF) 100 MCG/2ML IJ SOLN
25.0000 ug | INTRAMUSCULAR | Status: DC | PRN
  Administered 2023-11-01: 50 ug via INTRAVENOUS

## 2023-11-01 MED ORDER — FENTANYL CITRATE (PF) 100 MCG/2ML IJ SOLN
INTRAMUSCULAR | Status: AC
  Filled 2023-11-01: qty 2

## 2023-11-01 MED ORDER — CHLORHEXIDINE GLUCONATE 0.12 % MT SOLN
15.0000 mL | Freq: Once | OROMUCOSAL | Status: AC
  Administered 2023-11-01: 15 mL via OROMUCOSAL
  Filled 2023-11-01: qty 15

## 2023-11-01 MED ORDER — PROPOFOL 10 MG/ML IV BOLUS
INTRAVENOUS | Status: AC
  Filled 2023-11-01: qty 20

## 2023-11-01 MED ORDER — ACETAZOLAMIDE SODIUM 500 MG IJ SOLR
INTRAMUSCULAR | Status: AC
Start: 2023-11-01 — End: ?
  Filled 2023-11-01: qty 500

## 2023-11-01 MED ORDER — ACETAMINOPHEN 500 MG PO TABS
1000.0000 mg | ORAL_TABLET | Freq: Once | ORAL | Status: AC
  Administered 2023-11-01: 1000 mg via ORAL
  Filled 2023-11-01: qty 2

## 2023-11-01 MED ORDER — BSS IO SOLN
INTRAOCULAR | Status: AC
  Filled 2023-11-01: qty 15

## 2023-11-01 MED ORDER — MIDAZOLAM HCL 2 MG/2ML IJ SOLN
INTRAMUSCULAR | Status: DC | PRN
  Administered 2023-11-01: 2 mg via INTRAVENOUS

## 2023-11-01 MED ORDER — PHENYLEPHRINE 80 MCG/ML (10ML) SYRINGE FOR IV PUSH (FOR BLOOD PRESSURE SUPPORT)
PREFILLED_SYRINGE | INTRAVENOUS | Status: DC | PRN
  Administered 2023-11-01: 80 ug via INTRAVENOUS

## 2023-11-01 MED ORDER — DEXAMETHASONE SODIUM PHOSPHATE 10 MG/ML IJ SOLN
INTRAMUSCULAR | Status: AC
  Filled 2023-11-01: qty 1

## 2023-11-01 MED ORDER — GATIFLOXACIN 0.5 % OP SOLN
OPHTHALMIC | Status: AC
  Filled 2023-11-01: qty 2.5

## 2023-11-01 MED ORDER — BACITRACIN-POLYMYXIN B 500-10000 UNIT/GM OP OINT
TOPICAL_OINTMENT | OPHTHALMIC | Status: AC
Start: 2023-11-01 — End: ?
  Filled 2023-11-01: qty 3.5

## 2023-11-01 MED ORDER — SODIUM CHLORIDE (PF) 0.9 % IJ SOLN
INTRAMUSCULAR | Status: AC
  Filled 2023-11-01: qty 10

## 2023-11-01 MED ORDER — OXYCODONE HCL 5 MG/5ML PO SOLN: 5.0000 mg | Freq: Once | ORAL | Status: DC | PRN

## 2023-11-01 MED ORDER — OXYCODONE HCL 5 MG PO TABS: 5.0000 mg | ORAL_TABLET | Freq: Once | ORAL | Status: DC | PRN

## 2023-11-01 MED ORDER — FENTANYL CITRATE (PF) 250 MCG/5ML IJ SOLN
INTRAMUSCULAR | Status: AC
Start: 2023-11-01 — End: ?
  Filled 2023-11-01: qty 5

## 2023-11-01 MED ORDER — LIDOCAINE 2% (20 MG/ML) 5 ML SYRINGE
INTRAMUSCULAR | Status: DC | PRN
  Administered 2023-11-01: 60 mg via INTRAVENOUS

## 2023-11-01 MED ORDER — PHENYLEPHRINE HCL 10 % OP SOLN
1.0000 [drp] | OPHTHALMIC | Status: AC | PRN
  Administered 2023-11-01 (×3): 1 [drp] via OPHTHALMIC
  Filled 2023-11-01: qty 5

## 2023-11-01 MED ORDER — TRIAMCINOLONE ACETONIDE 40 MG/ML IJ SUSP
INTRAMUSCULAR | Status: DC | PRN
  Administered 2023-11-01: 10 mL via INTRAMUSCULAR

## 2023-11-01 MED ORDER — BSS PLUS IO SOLN
INTRAOCULAR | Status: DC | PRN
  Administered 2023-11-01: 1 via INTRAOCULAR

## 2023-11-01 MED ORDER — NA CHONDROIT SULF-NA HYALURON 40-30 MG/ML IO SOSY
INTRAOCULAR | Status: AC
  Filled 2023-11-01: qty 1

## 2023-11-01 MED ORDER — DEXAMETHASONE SODIUM PHOSPHATE 10 MG/ML IJ SOLN
INTRAMUSCULAR | Status: DC | PRN
  Administered 2023-11-01: 10 mg via INTRAVENOUS

## 2023-11-01 MED ORDER — POLYMYXIN B SULFATE 500000 UNITS IJ SOLR
INTRAMUSCULAR | Status: AC
Start: 2023-11-01 — End: ?
  Filled 2023-11-01: qty 10

## 2023-11-01 MED ORDER — PREDNISOLONE ACETATE 1 % OP SUSP
OPHTHALMIC | Status: DC | PRN
  Administered 2023-11-01: 1 [drp] via OPHTHALMIC

## 2023-11-01 MED ORDER — STERILE WATER FOR INJECTION IJ SOLN
INTRAMUSCULAR | Status: AC
  Filled 2023-11-01: qty 10

## 2023-11-01 MED ORDER — SUGAMMADEX SODIUM 200 MG/2ML IV SOLN
INTRAVENOUS | Status: DC | PRN
  Administered 2023-11-01: 200 mg via INTRAVENOUS
  Administered 2023-11-01: 100 mg via INTRAVENOUS

## 2023-11-01 MED ORDER — BUPIVACAINE HCL (PF) 0.75 % IJ SOLN
INTRAMUSCULAR | Status: AC
  Filled 2023-11-01: qty 10

## 2023-11-01 MED ORDER — ORAL CARE MOUTH RINSE: 15.0000 mL | Freq: Once | OROMUCOSAL | Status: AC

## 2023-11-01 MED ORDER — LACTATED RINGERS IV SOLN: INTRAVENOUS | Status: DC

## 2023-11-01 MED ORDER — FENTANYL CITRATE (PF) 250 MCG/5ML IJ SOLN
INTRAMUSCULAR | Status: DC | PRN
  Administered 2023-11-01 (×2): 100 ug via INTRAVENOUS

## 2023-11-01 MED ORDER — PROPARACAINE HCL 0.5 % OP SOLN
1.0000 [drp] | OPHTHALMIC | Status: AC | PRN
  Administered 2023-11-01 (×3): 1 [drp] via OPHTHALMIC
  Filled 2023-11-01: qty 15

## 2023-11-01 MED ORDER — ATROPINE SULFATE 1 % OP SOLN
OPHTHALMIC | Status: DC | PRN
  Administered 2023-11-01: 1 [drp] via OPHTHALMIC

## 2023-11-01 MED ORDER — CARBACHOL 0.01 % IO SOLN
INTRAOCULAR | Status: AC
  Filled 2023-11-01: qty 1.5

## 2023-11-01 MED ORDER — LIDOCAINE HCL 1 % IJ SOLN
INTRAMUSCULAR | Status: AC
  Filled 2023-11-01: qty 20

## 2023-11-01 MED ORDER — TROPICAMIDE 1 % OP SOLN
1.0000 [drp] | OPHTHALMIC | Status: AC | PRN
  Administered 2023-11-01 (×3): 1 [drp] via OPHTHALMIC
  Filled 2023-11-01: qty 15

## 2023-11-01 MED ORDER — BRIMONIDINE TARTRATE 0.2 % OP SOLN
OPHTHALMIC | Status: AC
  Filled 2023-11-01: qty 5

## 2023-11-01 MED ORDER — PROPOFOL 10 MG/ML IV BOLUS
INTRAVENOUS | Status: DC | PRN
  Administered 2023-11-01: 150 mg via INTRAVENOUS

## 2023-11-01 MED ORDER — GATIFLOXACIN 0.5 % OP SOLN
OPHTHALMIC | Status: DC | PRN
  Administered 2023-11-01: 1 [drp] via OPHTHALMIC

## 2023-11-01 MED ORDER — DORZOLAMIDE HCL-TIMOLOL MAL 2-0.5 % OP SOLN
OPHTHALMIC | Status: DC | PRN
  Administered 2023-11-01: 1 [drp] via OPHTHALMIC

## 2023-11-01 MED ORDER — AMISULPRIDE (ANTIEMETIC) 5 MG/2ML IV SOLN
INTRAVENOUS | Status: AC
  Filled 2023-11-01: qty 4

## 2023-11-01 MED ORDER — BACITRACIN-POLYMYXIN B 500-10000 UNIT/GM OP OINT
TOPICAL_OINTMENT | OPHTHALMIC | Status: DC | PRN
  Administered 2023-11-01: 1 via OPHTHALMIC

## 2023-11-01 MED ORDER — PREDNISOLONE ACETATE 1 % OP SUSP
OPHTHALMIC | Status: AC
  Filled 2023-11-01: qty 5

## 2023-11-01 MED ORDER — MIDAZOLAM HCL 2 MG/2ML IJ SOLN
INTRAMUSCULAR | Status: AC
  Filled 2023-11-01: qty 2

## 2023-11-01 MED ORDER — AMISULPRIDE (ANTIEMETIC) 5 MG/2ML IV SOLN
10.0000 mg | Freq: Once | INTRAVENOUS | Status: AC | PRN
  Administered 2023-11-01: 10 mg via INTRAVENOUS

## 2023-11-01 MED ORDER — SODIUM CHLORIDE 0.9 % IV SOLN: INTRAVENOUS | Status: DC

## 2023-11-01 MED ORDER — LIDOCAINE 2% (20 MG/ML) 5 ML SYRINGE
INTRAMUSCULAR | Status: AC
  Filled 2023-11-01: qty 5

## 2023-11-01 MED ORDER — BSS PLUS IO SOLN
INTRAOCULAR | Status: AC
  Filled 2023-11-01: qty 500

## 2023-11-01 MED ORDER — ROCURONIUM BROMIDE 10 MG/ML (PF) SYRINGE
PREFILLED_SYRINGE | INTRAVENOUS | Status: DC | PRN
  Administered 2023-11-01: 20 mg via INTRAVENOUS
  Administered 2023-11-01: 40 mg via INTRAVENOUS
  Administered 2023-11-01: 10 mg via INTRAVENOUS
  Administered 2023-11-01: 20 mg via INTRAVENOUS

## 2023-11-01 MED ORDER — ROCURONIUM BROMIDE 10 MG/ML (PF) SYRINGE
PREFILLED_SYRINGE | INTRAVENOUS | Status: AC
  Filled 2023-11-01: qty 10

## 2023-11-01 SURGICAL SUPPLY — 57 items
APPLICATOR COTTON TIP 6 STRL (MISCELLANEOUS) ×4 IMPLANT
APPLICATOR COTTON TIP 6IN STRL (MISCELLANEOUS) ×4 IMPLANT
BAND WRIST GAS GREEN (MISCELLANEOUS) IMPLANT
BLADE EYE CATARACT 19 1.4 BEAV (BLADE) IMPLANT
BNDG EYE OVAL 2 1/8 X 2 5/8 (GAUZE/BANDAGES/DRESSINGS) ×1 IMPLANT
CABLE BIPOLOR RESECTION CORD (MISCELLANEOUS) ×1 IMPLANT
CANNULA ANT CHAM MAIN (OPHTHALMIC RELATED) IMPLANT
CANNULA DUALBORE 25G (CANNULA) ×1 IMPLANT
CANNULA FLEX TIP 25G (CANNULA) ×1 IMPLANT
CLSR STERI-STRIP ANTIMIC 1/2X4 (GAUZE/BANDAGES/DRESSINGS) ×1 IMPLANT
DRAPE INCISE 51X51 W/FILM STRL (DRAPES) ×1 IMPLANT
DRAPE MICROSCOPE LEICA 46X105 (MISCELLANEOUS) ×1 IMPLANT
DRAPE OPHTHALMIC 77X100 STRL (CUSTOM PROCEDURE TRAY) ×1 IMPLANT
FILTER BLUE MILLIPORE (MISCELLANEOUS) IMPLANT
FILTER STRAW FLUID ASPIR (MISCELLANEOUS) IMPLANT
FORCEPS GRIESHABER ILM 25G A (INSTRUMENTS) IMPLANT
FORCEPS GRIESHABER MAX 25G (MISCELLANEOUS) IMPLANT
GAS AUTO FILL CONSTEL (OPHTHALMIC) ×1 IMPLANT
GAS AUTO FILL CONSTELLATION (OPHTHALMIC) IMPLANT
GLOVE BIO SURGEON STRL SZ7.5 (GLOVE) ×2 IMPLANT
GLOVE BIOGEL M 7.0 STRL (GLOVE) ×1 IMPLANT
GOWN STRL REUS W/ TWL LRG LVL3 (GOWN DISPOSABLE) ×2 IMPLANT
GOWN STRL REUS W/ TWL XL LVL3 (GOWN DISPOSABLE) ×1 IMPLANT
KIT BASIN OR (CUSTOM PROCEDURE TRAY) ×1 IMPLANT
KIT PERFLUORON PROCEDURE 5ML (MISCELLANEOUS) IMPLANT
LENS VITRECTOMY FLAT OCLR DISP (MISCELLANEOUS) IMPLANT
LOOP FINESSE 25 GA (MISCELLANEOUS) IMPLANT
MICROPICK 25G (MISCELLANEOUS) IMPLANT
NDL 18GX1X1/2 (RX/OR ONLY) (NEEDLE) ×2 IMPLANT
NDL 25GX 5/8IN NON SAFETY (NEEDLE) ×1 IMPLANT
NDL FILTER BLUNT 18X1 1/2 (NEEDLE) ×1 IMPLANT
NDL HYPO 30X.5 LL (NEEDLE) ×2 IMPLANT
NEEDLE 18GX1X1/2 (RX/OR ONLY) (NEEDLE) ×2 IMPLANT
NEEDLE 25GX 5/8IN NON SAFETY (NEEDLE) ×1 IMPLANT
NEEDLE FILTER BLUNT 18X1 1/2 (NEEDLE) ×1 IMPLANT
NEEDLE HYPO 30X.5 LL (NEEDLE) ×2 IMPLANT
NS IRRIG 1000ML POUR BTL (IV SOLUTION) ×1 IMPLANT
PACK VITRECTOMY CUSTOM (CUSTOM PROCEDURE TRAY) ×1 IMPLANT
PAD ARMBOARD POSITIONER FOAM (MISCELLANEOUS) ×2 IMPLANT
PAK PIK VITRECTOMY CVS 25GA (OPHTHALMIC) ×1 IMPLANT
PENCIL BIPOLAR 25GA STR DISP (OPHTHALMIC RELATED) IMPLANT
PICK MICROPICK 25G (MISCELLANEOUS) IMPLANT
PROBE ENDO DIATHERMY 25G (MISCELLANEOUS) IMPLANT
PROBE LASER ILLUM FLEX CVD 25G (OPHTHALMIC) IMPLANT
REPL STRA BRUSH NDL (NEEDLE) IMPLANT
REPL STRA BRUSH NEEDLE (NEEDLE) IMPLANT
RESERVOIR BACK FLUSH (MISCELLANEOUS) IMPLANT
RETRACTOR IRIS FLEX 25G GRIESH (INSTRUMENTS) IMPLANT
SCISSORS TIP ADVANCED DSP 25GA (INSTRUMENTS) IMPLANT
SET INJECTOR OIL FLUID CONSTEL (OPHTHALMIC) IMPLANT
SUT VICRYL 7 0 TG140 8 (SUTURE) ×1 IMPLANT
SYR 10ML LL (SYRINGE) ×2 IMPLANT
SYR TB 1ML LUER SLIP (SYRINGE) ×2 IMPLANT
TOWEL GREEN STERILE FF (TOWEL DISPOSABLE) ×1 IMPLANT
TRAY FOLEY W/BAG SLVR 14FR (SET/KITS/TRAYS/PACK) IMPLANT
TUBING HIGH PRESS EXTEN 6IN (TUBING) ×1 IMPLANT
WATER STERILE IRR 1000ML POUR (IV SOLUTION) ×1 IMPLANT

## 2023-11-01 NOTE — Anesthesia Postprocedure Evaluation (Signed)
 Anesthesia Post Note  Patient: Kimberly Richards  Procedure(s) Performed: PARS PLANA VITRECTOMY FOR MACULAR HOLE WITH LASER AND INTRAOCULAR TAMPONADE; INSERTION OF GAS; MEMBRANE PEEL (Left: Eye)     Patient location during evaluation: PACU Anesthesia Type: General Level of consciousness: awake Pain management: pain level controlled Vital Signs Assessment: post-procedure vital signs reviewed and stable Respiratory status: spontaneous breathing, nonlabored ventilation and respiratory function stable Cardiovascular status: blood pressure returned to baseline and stable Postop Assessment: no apparent nausea or vomiting Anesthetic complications: no   No notable events documented.  Last Vitals:  Vitals:   11/01/23 1330 11/01/23 1345  BP: 133/80 109/85  Pulse: 64 87  Resp: 18 20  Temp:  36.9 C  SpO2: 100% 97%    Last Pain:  Vitals:   11/01/23 1345  TempSrc:   PainSc: Asleep                 Ayaansh Smail P Enzley Kitchens

## 2023-11-01 NOTE — Discharge Instructions (Addendum)
POSTOPERATIVE INSTRUCTIONS  Your doctor has performed vitreoretinal surgery on you at [place].  - Keep eye patched and shielded until seen by Dr. Zamora 8 AM tomorrow in clinic - Do not use drops until return - FACE DOWN POSITIONING WHILE AWAKE - Sleep with belly down or on right side, avoid laying flat on back.    - No strenuous bending, stooping or lifting.  - You may not drive until further notice.  - If your doctor used a gas bubble in your eye during the procedure he will advise you on postoperative positioning. If you have a gas bubble you will be wearing a green bracelet that was applied in the operating room. The green bracelet should stay on as long as the gas bubble is in your eye. While the gas bubble is present you should not fly in an airplane. If you require general anesthesia while the gas bubble is present you must notify your anesthesiologist that an intraocular gas bubble is present so he can take the appropriate precautions.  - Tylenol or any other over-the-counter pain reliever can be used according to your doctor. If more pain medicine is required, your doctor will have a prescription for you.  - You may read, go up and down stairs, and watch television.     Brian Zamora, M.D., Ph.D.  

## 2023-11-01 NOTE — Transfer of Care (Signed)
 Immediate Anesthesia Transfer of Care Note  Patient: Kimberly Richards  Procedure(s) Performed: PARS PLANA VITRECTOMY FOR MACULAR HOLE WITH LASER AND INTRAOCULAR TAMPONADE; INSERTION OF GAS; MEMBRANE PEEL (Left: Eye)  Patient Location: PACU  Anesthesia Type:General  Level of Consciousness: drowsy and patient cooperative  Airway & Oxygen Therapy: Patient Spontanous Breathing and Patient connected to nasal cannula oxygen  Post-op Assessment: Report given to RN and Post -op Vital signs reviewed and stable  Post vital signs: Reviewed and stable  Last Vitals:  Vitals Value Taken Time  BP 142/81 11/01/23 1310  Temp    Pulse 72 11/01/23 1312  Resp 10 11/01/23 1312  SpO2 99 % 11/01/23 1312  Vitals shown include unfiled device data.  Last Pain:  Vitals:   11/01/23 0929  TempSrc:   PainSc: 7       Patients Stated Pain Goal: 3 (11/01/23 0929)  Complications: No notable events documented.

## 2023-11-01 NOTE — Op Note (Signed)
 Date of procedure:  11/01/2023   Surgeon: [name], MD, PhD   Assistant: Laurian Brim, Ophthalmic Assistant   Pre-operative Diagnoses: Full thickness macular hole, Left Eye   Post-operative diagnosis: Full thickness macular hole, Left Eye Retinal defect, Left Eye   Anesthesia: General   Procedure:   1. 25 gauge pars plana vitrectomy, Left Eye 2. TissueBlue stain, Left Eye 3. Internal Limiting Membrane peel, Left Eye  4. Endolaser, Left Eye 5. Injection 14% C3F8, Left Eye     Indications for procedure: The patient presented with a full thickness macular hole and complaint of central visual loss consistent with a scotoma.  After discussing the risks, benefits, and alternatives to surgery, the patient electively decided to undergo surgical repair and informed consent was obtained.  The surgery was an attempt to close the macular hole and potentially improve the vision within the reasonable expectations of the surgeon.    Procedure in Detail:    The patient was met in the pre-operative holding area where their identification data was verified.  It was noted that there was a signed, informed consent in the chart and the Left Eye eye was verbally verified by the patient as the operative eye and was marked with a marking pen. The patient was then taken to the operating room and placed in the supine position. General endotracheal anesthesia was induced.   The eye was then prepped with 5% betadine and draped in the normal fashion for ophthalmic surgery. The microscope was draped and swung into position, and a secondary time-out was performed to identify the correct patient, eyes, procedures, and any allergies.   A 25 gauge trocar was inserted in a 30-45 degrees fashion into the inferotemporal quadrant 4 mm posterior to the limbus in this phakic patient.  Correct positioning within the vitreous was verified externally with the light pipe.  The infusion was then connected to the cannula and BSS  infusion was commenced.  Additional ports were placed in the superonasal and superotemporal quadrants. Viscoat was placed on the cornea. The BIOM was used to visualize the posterior segment. Of note, the patient has a history or retinal detachment repair in this eye and has no residual vitreous in the eye. This was confirmed via intravitreal kenalog injection while a core vitrectomy was completed. The patient had a visible full thickness macular hole.     TissueBlue was then used to stain the internal limiting membrane. A macular contact lens was placed on the eye. A 25g Finesse loop was usd to create an opening in the ILM and the ILM was peeled fully from the macula taking care to avoid traction on the macular hole. Of note, visualization of the retina was blurred by the patient's posterior subcapsular cataract. During the peel, a very small iatrogenic defect was created in the superotemporal maculal. A single row of barrier laser retinopexy was placed around the defect.   The wide angle viewing system was brought back into position. Scleral depression was performed and used to meticulously inspect the peripheral retina and place a single row of supplemental laser to the existing peripheral laser from her previous retinal detachment surgery. There were no new or additional retinal tears or defects. An air fluid exchange was performed.    The superotemporal port was then removed and sutured with 7-0 vicryl, there was no leakage. 14% C3F8 gas was connected to the infusion line and gas was injected into the posterior segment while venting air through the superonasal trocar using the  extrusion cannula. Once a full, 40cc of gas was vented through the eye, the infusion port and venting ports were removed and they were sutured with 7-0 vicryl.  There was no leakage from the sclerotomy sites.   Subconjunctival injections of kefzol + bacitracin + polymixin b and kenalog were then administered, and antibiotic and  steroid drops as well as antibiotic ointment were placed in the eye.  The drapes were removed and the eye was patched and shielded.  A green gas bracelet was placed on the patient's wrist. The patient was then taken to the post-operative area for recovery having tolerated the procedure well.  She was instructed to perform face down positioning postoperatively and to follow up in clinic the following morning as scheduled.   Estimate blood lost: none Complications: None

## 2023-11-01 NOTE — Anesthesia Preprocedure Evaluation (Addendum)
 Anesthesia Evaluation  Patient identified by MRN, date of birth, ID band Patient awake    Reviewed: Allergy & Precautions, NPO status , Patient's Chart, lab work & pertinent test results  History of Anesthesia Complications (+) PONV and history of anesthetic complications  Airway Mallampati: II  TM Distance: >3 FB Neck ROM: Full   Comment: Previous grade II view with Miller 2, easy mask Dental  (+) Dental Advisory Given,    Pulmonary neg pulmonary ROS   Pulmonary exam normal breath sounds clear to auscultation       Cardiovascular (-) hypertension(-) angina (-) Past MI, (-) Cardiac Stents and (-) CABG (-) dysrhythmias  Rhythm:Regular Rate:Normal  HLD   Neuro/Psych  PSYCHIATRIC DISORDERS Anxiety Depression    negative neurological ROS     GI/Hepatic negative GI ROS, Neg liver ROS,,,  Endo/Other  neg diabetes  Primary hyperparathyroidism  Renal/GU negative Renal ROS     Musculoskeletal   Abdominal   Peds  Hematology  (+) Blood dyscrasia, anemia Lab Results      Component                Value               Date                      WBC                      4.7                 04/11/2023                HGB                      13.5                04/11/2023                HCT                      41.0                04/11/2023                MCV                      89.6                04/11/2023                PLT                      360.0               04/11/2023              Anesthesia Other Findings   Reproductive/Obstetrics H/o ovarian cancer                             Anesthesia Physical Anesthesia Plan  ASA: 2  Anesthesia Plan: General   Post-op Pain Management: Tylenol PO (pre-op)*   Induction: Intravenous  PONV Risk Score and Plan: 4 or greater and Ondansetron, Dexamethasone, Midazolam, Scopolamine patch - Pre-op and Treatment may vary due to age or medical  condition  Airway Management Planned: Oral ETT  Additional Equipment:  Intra-op Plan:   Post-operative Plan: Extubation in OR  Informed Consent: I have reviewed the patients History and Physical, chart, labs and discussed the procedure including the risks, benefits and alternatives for the proposed anesthesia with the patient or authorized representative who has indicated his/her understanding and acceptance.     Dental advisory given  Plan Discussed with: CRNA and Anesthesiologist  Anesthesia Plan Comments: (Risks of general anesthesia discussed including, but not limited to, sore throat, hoarse voice, chipped/damaged teeth, injury to vocal cords, nausea and vomiting, allergic reactions, lung infection, heart attack, stroke, and death. All questions answered. )        Anesthesia Quick Evaluation

## 2023-11-01 NOTE — Interval H&P Note (Signed)
 History and Physical Interval Note:  11/01/2023 10:33 AM  Kimberly Richards  has presented today for surgery, with the diagnosis of full thickness macular hole, left eye.  The various methods of treatment have been discussed with the patient and family. After consideration of risks, benefits and other options for treatment, the patient has consented to  Procedure(s): PARS PLANA VITRECTOMY  FOR MACULAR HOLE WITH LASER AND INTRAOCULAR TAMPONADE (Left) as a surgical intervention.  The patient's history has been reviewed, patient examined, no change in status, stable for surgery.  I have reviewed the patient's chart and labs.  Questions were answered to the patient's satisfaction.     [name]

## 2023-11-01 NOTE — Anesthesia Procedure Notes (Signed)
 Procedure Name: Intubation Date/Time: 11/01/2023 11:00 AM  Performed by: Yolonda Kida, CRNAPre-anesthesia Checklist: Patient identified, Emergency Drugs available, Suction available and Patient being monitored Patient Re-evaluated:Patient Re-evaluated prior to induction Oxygen Delivery Method: Circle System Utilized Preoxygenation: Pre-oxygenation with 100% oxygen Induction Type: IV induction Ventilation: Mask ventilation without difficulty Laryngoscope Size: Mac and 3 Grade View: Grade I Tube type: Oral Tube size: 7.0 mm Number of attempts: 1 Airway Equipment and Method: Stylet Placement Confirmation: ETT inserted through vocal cords under direct vision, positive ETCO2 and breath sounds checked- equal and bilateral Secured at: 22 cm Tube secured with: Tape Dental Injury: Teeth and Oropharynx as per pre-operative assessment

## 2023-11-01 NOTE — Brief Op Note (Signed)
 11/01/2023  1:09 PM  PATIENT:  Kimberly Richards  48 y.o. female  PRE-OPERATIVE DIAGNOSIS:  full thickness macular hole, left eye  POST-OPERATIVE DIAGNOSIS:  full thickness macular hole, left eye  PROCEDURE:  Procedure(s): PARS PLANA VITRECTOMY FOR MACULAR HOLE WITH LASER AND INTRAOCULAR TAMPONADE; INSERTION OF GAS; MEMBRANE PEEL (Left)  SURGEON:  Surgeons and Role:    [name], MD - Primary  ASSISTANTS: Laurian Brim, Ophthalmic Assistant    ANESTHESIA:   general  EBL:  minimal   BLOOD ADMINISTERED:none  DRAINS: none   LOCAL MEDICATIONS USED:  NONE  SPECIMEN:  No Specimen  DISPOSITION OF SPECIMEN:  N/A  COUNTS:  YES  TOURNIQUET:  * No tourniquets in log *  DICTATION: .Note written in EPIC  PLAN OF CARE: Discharge to home after PACU  PATIENT DISPOSITION:  PACU - hemodynamically stable.   Delay start of Pharmacological VTE agent (>24hrs) due to surgical blood loss or risk of bleeding: not applicable

## 2023-11-02 ENCOUNTER — Encounter (HOSPITAL_COMMUNITY): Payer: Self-pay | Admitting: Ophthalmology

## 2023-11-02 ENCOUNTER — Encounter (INDEPENDENT_AMBULATORY_CARE_PROVIDER_SITE_OTHER): Admitting: Ophthalmology

## 2023-11-02 ENCOUNTER — Ambulatory Visit (INDEPENDENT_AMBULATORY_CARE_PROVIDER_SITE_OTHER): Admitting: Ophthalmology

## 2023-11-02 DIAGNOSIS — H25812 Combined forms of age-related cataract, left eye: Secondary | ICD-10-CM

## 2023-11-02 DIAGNOSIS — H4312 Vitreous hemorrhage, left eye: Secondary | ICD-10-CM

## 2023-11-02 DIAGNOSIS — H35411 Lattice degeneration of retina, right eye: Secondary | ICD-10-CM

## 2023-11-02 DIAGNOSIS — H3322 Serous retinal detachment, left eye: Secondary | ICD-10-CM

## 2023-11-02 DIAGNOSIS — H35342 Macular cyst, hole, or pseudohole, left eye: Secondary | ICD-10-CM

## 2023-11-02 DIAGNOSIS — H35352 Cystoid macular degeneration, left eye: Secondary | ICD-10-CM

## 2023-11-02 DIAGNOSIS — H33321 Round hole, right eye: Secondary | ICD-10-CM

## 2023-11-04 ENCOUNTER — Encounter (INDEPENDENT_AMBULATORY_CARE_PROVIDER_SITE_OTHER): Payer: Self-pay | Admitting: Ophthalmology

## 2023-11-07 ENCOUNTER — Encounter: Admitting: Obstetrics & Gynecology

## 2023-11-07 NOTE — Progress Notes (Signed)
 [facility] - Clinic Note  11/09/2023   CHIEF COMPLAINT Patient presents for Retina Follow Up  HISTORY OF PRESENT ILLNESS: Kimberly Richards is a 48 y.o. female who presents to the clinic today for:  HPI     Retina Follow Up   In left eye.  This started 8 days ago.  Duration of 1 week.  Since onset it is stable.  I, the attending physician,  performed the HPI with the patient and updated documentation appropriately.        Comments   1 week follow up FTMH OS. Prednisolone qid OS, Polysporine qid OS, and atropine bid. Pt states she has a lot of sensitivity to light. Pt states she is feeling throbbing pain in the left eye, some days are worse than others and she is using tylenol and ibuprofen for relief.       Last edited by [name], MD on 11/12/2023  6:39 PM.    Pt states her cataract sx is scheduled for the end of July with Dr. Zenaida Niece   Referring physician: [place], [place], OD [address] [place],  [place] [address]  HISTORICAL INFORMATION:  Selected notes from the MEDICAL RECORD NUMBER Referred by Dr. Bascom Levels for concern of RD OS LEE:  Ocular Hx- PMH-   CURRENT MEDICATIONS: Current Outpatient Medications (Ophthalmic Drugs)  Medication Sig   bacitracin-polymyxin b (POLYSPORIN) ophthalmic ointment Place into the right eye at bedtime for 10 days. Place a 1/2 inch ribbon of ointment into the lower eyelid.   Bromfenac Sodium 0.075 % SOLN Place 1 drop into the left eye 4 (four) times daily.   prednisoLONE acetate (PRED FORTE) 1 % ophthalmic suspension Place 1 drop into the right eye 4 (four) times daily.   No current facility-administered medications for this visit. (Ophthalmic Drugs)   Current Outpatient Medications (Other)  Medication Sig   Azelaic Acid 15 % gel APPLY TOPICALLY TO AFFECTED AREA TWICE DAILY IN THE MORNING AND EVENING AFTER SKIN HAS BEEN THOROUGHLY WASHED AND PATTED DRY   B Complex-C-Folic Acid (HM VITAMIN B COMPLEX/VITAMIN C  PO) Take 1 tablet by mouth daily.   clonazePAM (KLONOPIN) 0.5 MG tablet Take 0.5-1 tablets (0.25-0.5 mg total) by mouth 2 (two) times daily as needed for anxiety.   FLUoxetine (PROZAC) 10 MG tablet Take 1 tablet (10 mg total) by mouth daily.   MAGNESIUM GLYCINATE PO Take 1 tablet by mouth at bedtime.   Multiple Vitamin (MULTIVITAMIN) tablet Take 1 tablet by mouth daily.   Probiotic Product (FORTIFY PROBIOTIC WOMENS EX ST PO) Take 1 tablet by mouth daily.   spironolactone (ALDACTONE) 50 MG tablet Take 1 tablet by mouth once daily   tretinoin (RETIN-A) 0.025 % cream Apply topically at bedtime.   vitamin E 180 MG (400 UNITS) capsule Take 400 Units by mouth 2 (two) times daily.   No current facility-administered medications for this visit. (Other)   REVIEW OF SYSTEMS: ROS   Positive for: Neurological, Skin, Eyes Negative for: Constitutional, Gastrointestinal, Genitourinary, Musculoskeletal, HENT, Endocrine, Cardiovascular, Respiratory, Psychiatric, Allergic/Imm, Heme/Lymph Last edited by Elicia Lamp, COT on 11/09/2023  7:41 AM.        ALLERGIES Allergies  Allergen Reactions   Stadol [Butorphanol]     Hallucinations    Sulfa Antibiotics     Other reaction(s): gi distress   PAST MEDICAL HISTORY Past Medical History:  Diagnosis Date   Abnormal Pap smear of cervix    Acne    On  Aldactone   Anemia    Anxiety with depression 12/18/2017   Cancer (HCC)    Constipation    H/O ovarian cancer    High cholesterol    Hot flashes    HPV (human papilloma virus) anogenital infection    Other hyperlipidemia 07/03/2022   Ovarian cyst    PONV (postoperative nausea and vomiting)    Primary hyperparathyroidism (HCC)    Past Surgical History:  Procedure Laterality Date   BREAST BIOPSY Right    CESAREAN SECTION     x3   EYE SURGERY Right    office procedure of the right eye   GAS INSERTION Left 05/16/2023   Procedure: INSERTION OF GAS;  Surgeon: [name], MD;  Location: [facility] OR;   Service: Ophthalmology;  Laterality: Left;   LAPAROSCOPIC HYSTERECTOMY     total   LAPAROTOMY     Ovarian mass removed  12/06/2011   Borderline Ovarian Cancer   PARS PLANA VITRECTOMY Left 05/16/2023   Procedure: PARS PLANA VITRECTOMY WITH 25 GAUGE;  Surgeon: [name], MD;  [facility];  Service: Ophthalmology;  Laterality: Left;   PARS PLANA VITRECTOMY 27 GAUGE WITH 25 GAUGE PORT Left 11/01/2023   Procedure: PARS PLANA VITRECTOMY FOR MACULAR HOLE WITH LASER AND INTRAOCULAR TAMPONADE; INSERTION OF GAS; MEMBRANE PEEL;  Surgeon: [name], MD;  Location: [facility] OR;  Service: Ophthalmology;  Laterality: Left;   PHOTOCOAGULATION WITH LASER Left 05/16/2023   Procedure: PHOTOCOAGULATION WITH LASER;  Surgeon: [name], MD;  Location: [facility] OR;  Service: Ophthalmology;  Laterality: Left;   SMALL INTESTINE SURGERY     FAMILY HISTORY Family History  Problem Relation Age of Onset   Hypertension Mother    Diabetes Mother    High blood pressure Mother    Depression Mother    Drug abuse Mother    Obesity Mother    Hypertension Father    Diabetes Father    High Cholesterol Father    Heart disease Father    Cancer Father    Drug abuse Father    Hypertension Maternal Grandmother    Diabetes Maternal Grandmother    Hypertension Maternal Grandfather    Diabetes Maternal Grandfather    Cancer Paternal Grandmother        breast   Hypertension Paternal Grandmother    Diabetes Paternal Grandmother    Hypertension Paternal Grandfather    Diabetes Paternal Grandfather    Rectal cancer Other    Stomach cancer Other    Esophageal cancer Other    Colon polyps Other    Colon cancer Other    SOCIAL HISTORY Social History   Tobacco Use   Smoking status: Never   Smokeless tobacco: Never  Vaping Use   Vaping status: Never Used  Substance Use Topics   Alcohol use: Yes    Comment: cocktails 3 x week   Drug use: No       OPHTHALMIC EXAM:  Base Eye Exam     Visual Acuity (Snellen  - Linear)       Right Left   Dist cc 20/20 -2 LP   Dist ph cc  NI    Correction: Glasses         Tonometry (Tonopen, 7:51 AM)       Right Left   Pressure 13 11         Pupils       Dark Light Shape React APD   Right 3 2 Round Brisk None   Left 5  5 Round None None         Visual Fields       Left Right    Full Full         Extraocular Movement       Right Left    Full, Ortho Full, Ortho         Neuro/Psych     Oriented x3: Yes   Mood/Affect: Normal         Dilation     Both eyes: 1.0% Mydriacyl, 2.5% Phenylephrine @ 7:51 AM           Slit Lamp and Fundus Exam     Slit Lamp Exam       Right Left   Lids/Lashes Normal Dermatochalasis - upper lid   Conjunctiva/Sclera mild melanosis mild melanosis, Subconjunctival hemorrhage -- improving, sutures intact -- dissolving   Cornea arcus, trace tear film debris Mild arcus, endo pigment   Anterior Chamber deep, clear, narrow temporal angle deep and clear, no cell or flare   Iris Round and reactive Round and dilated   Lens 1+ Cortical cataract 2-3+ Nuclear sclerosis, 2-3+ Cortical cataract, 3-4+ Posterior subcapsular cataract, fine pigment on anterior capsule   Anterior Vitreous mild syneresis post vitrectomy, good gas fill         Fundus Exam       Right Left   Disc Pink and Sharp, mild PPP perfused, Sharp rim   C/D Ratio 0.6 0.4   Macula Flat, Good foveal reflex, RPE mottling, No heme or edema hazy view, macular hole closing, flat under gas, laser ST quad   Vessels attenuated, mild tortuosity attenuated   Periphery Attached, patches of lattice degeneration at 1100 and from 0430-0600, smaller patches of lattice at 0300 and 0730 equator -- good laser changes surrounding all lesions, no new RT/RD or lattice Attached, new row of supplemental laser posterior to previous laser, good laser surrounding large tears SN quad at 1000 and 1100, good 360 peripheral laser, focal fibrosis; PRE-OP: 2 large tears  SN quad at 1000 and 1100 with +SRF; no other details visible elsewhere           Refraction     Wearing Rx       Sphere Cylinder Axis Add   Right -6.50 +4.75 094 +2.00   Left -6.25 +4.00 091 +2.00           IMAGING AND PROCEDURES  Imaging and Procedures for 11/09/2023         ASSESSMENT/PLAN:   ICD-10-CM   1. Full thickness macular hole of left eye  H35.342     2. Left retinal detachment  H33.22     3. Cystoid macular edema of left eye  H35.352     4. Vitreous hemorrhage of left eye (HCC)  H43.12     5. Lattice degeneration of right retina  H35.411     6. Retinal hole of right eye  H33.321     7. Combined forms of age-related cataract of left eye  H25.812       Macular hole, left eye   - OCT showed new full-thickness macular hole OS on 03.24.25 visit - POW1 s/p PPV/TissueBlue/MP/14% C3F8 OS, 03.27.25             - doing well             - retina attached and good gas bubble in place w/ mac hole closing             -  IOP okay at 16             - cont PF 4x/day OS                          zymaxid QID OS -- okay to stop now                         Atropine BID OS -- okay to stop now                         PSO ung QID OS -- can dec to QHS and prn during the day             - cont face down positioning 50% of time; avoid laying flat on back              - eye shield when sleeping              - post op drop and positioning instructions reviewed              - tylenol/ibuprofen for pain  - f/u 2-3 weeks, DFE OS only, no OCT  2-4. Rhegmatogenous retinal detachment with vitreous hemorrhage and CME OS  - s/p STK OS #1 (01.13.25)  - pt reports progressive vision loss OS w/ onset on Saturday, 10.05.24, green blobs in vision  - denies photopsias  - vision became increasing blurry and presented to ED on Sunday, 10.06.24  - outpt follow up with Dr. Bascom Levels on 10.07.24 - exam 10.8.24 w/ diffuse VH (no view of posterior pole), two large retinal tears at 1000 and 1100  (superonasal periphery) with +SRF - B-scan 10.8.24 ultrasound shows focal peripheral detachment in superonasal quad with tears at 1000 and 1100, matching exam - s/p PPV/PFO/EL/FAX/14% C3F8 OS, 10.09.2024             - retina attached and in good position -- good laser around breaks and 360             - IOP OS 16  5,6. Lattice degeneration w/ atrophic holes, right eye - patches of lattice at 11 and from 0430-0600, smaller patches of lattice at 0300 and 0730 equator - discussed findings, prognosis, and treatment options including observation - s/p retinopexy OD (10.08.24) -- good laser surrounding all lesions - no new RT/RD or lattice - monitor  7. Mixed Cataract OS - The symptoms of cataract, surgical options, and treatments and risks were discussed with patient. - discussed diagnosis and progression, specifically post-vitrectomy cataract progression - will hold off on surgery until fully recovered from macular hole repair  Ophthalmic Meds Ordered this visit:  Meds ordered this encounter  Medications   prednisoLONE acetate (PRED FORTE) 1 % ophthalmic suspension    Sig: Place 1 drop into the right eye 4 (four) times daily.    Dispense:  15 mL    Refill:  0   bacitracin-polymyxin b (POLYSPORIN) ophthalmic ointment    Sig: Place into the right eye at bedtime for 10 days. Place a 1/2 inch ribbon of ointment into the lower eyelid.    Dispense:  3.5 g    Refill:  5     Return for f/u 2-3 weeks, FTMH OS, DFE.  There are no Patient Instructions on file for this visit.  This document serves as a record of services personally performed by Karie Chimera, MD,  PhD. It was created on their behalf by Charlette Caffey, COT an ophthalmic technician. The creation of this record is the provider's dictation and/or activities during the visit.    Electronically signed by:  Charlette Caffey, COT  11/12/23 6:39 PM  Karie Chimera, M.D., Ph.D. Diseases & Surgery of the Retina and  Vitreous [facility]  I have reviewed the above documentation for accuracy and completeness, and I agree with the above. Karie Chimera, M.D., Ph.D. 11/12/23 6:42 PM    Abbreviations: M myopia (nearsighted); A astigmatism; H hyperopia (farsighted); P presbyopia; Mrx spectacle prescription;  CTL contact lenses; OD right eye; OS left eye; OU both eyes  XT exotropia; ET esotropia; PEK punctate epithelial keratitis; PEE punctate epithelial erosions; DES dry eye syndrome; MGD meibomian gland dysfunction; ATs artificial tears; PFAT's preservative free artificial tears; NSC nuclear sclerotic cataract; PSC posterior subcapsular cataract; ERM epi-retinal membrane; PVD posterior vitreous detachment; RD retinal detachment; DM diabetes mellitus; DR diabetic retinopathy; NPDR non-proliferative diabetic retinopathy; PDR proliferative diabetic retinopathy; CSME clinically significant macular edema; DME diabetic macular edema; dbh dot blot hemorrhages; CWS cotton wool spot; POAG primary open angle glaucoma; C/D cup-to-disc ratio; HVF humphrey visual field; GVF goldmann visual field; OCT optical coherence tomography; IOP intraocular pressure; BRVO Branch retinal vein occlusion; CRVO central retinal vein occlusion; CRAO central retinal artery occlusion; BRAO branch retinal artery occlusion; RT retinal tear; SB scleral buckle; PPV pars plana vitrectomy; VH Vitreous hemorrhage; PRP panretinal laser photocoagulation; IVK intravitreal kenalog; VMT vitreomacular traction; MH Macular hole;  NVD neovascularization of the disc; NVE neovascularization elsewhere; AREDS age related eye disease study; ARMD age related macular degeneration; POAG primary open angle glaucoma; EBMD epithelial/anterior basement membrane dystrophy; ACIOL anterior chamber intraocular lens; IOL intraocular lens; PCIOL posterior chamber intraocular lens; Phaco/IOL phacoemulsification with intraocular lens placement; PRK photorefractive  keratectomy; LASIK laser assisted in situ keratomileusis; HTN hypertension; DM diabetes mellitus; COPD chronic obstructive pulmonary disease

## 2023-11-09 ENCOUNTER — Ambulatory Visit (INDEPENDENT_AMBULATORY_CARE_PROVIDER_SITE_OTHER): Admitting: Ophthalmology

## 2023-11-09 ENCOUNTER — Encounter (INDEPENDENT_AMBULATORY_CARE_PROVIDER_SITE_OTHER): Payer: Self-pay | Admitting: Ophthalmology

## 2023-11-09 DIAGNOSIS — H35411 Lattice degeneration of retina, right eye: Secondary | ICD-10-CM

## 2023-11-09 DIAGNOSIS — H35352 Cystoid macular degeneration, left eye: Secondary | ICD-10-CM

## 2023-11-09 DIAGNOSIS — H35342 Macular cyst, hole, or pseudohole, left eye: Secondary | ICD-10-CM

## 2023-11-09 DIAGNOSIS — H4312 Vitreous hemorrhage, left eye: Secondary | ICD-10-CM

## 2023-11-09 DIAGNOSIS — H33321 Round hole, right eye: Secondary | ICD-10-CM

## 2023-11-09 DIAGNOSIS — H25812 Combined forms of age-related cataract, left eye: Secondary | ICD-10-CM

## 2023-11-09 DIAGNOSIS — H3322 Serous retinal detachment, left eye: Secondary | ICD-10-CM

## 2023-11-09 MED ORDER — BACITRACIN-POLYMYXIN B 500-10000 UNIT/GM OP OINT: TOPICAL_OINTMENT | Freq: Every day | OPHTHALMIC | 5 refills | Status: AC

## 2023-11-09 MED ORDER — PREDNISOLONE ACETATE 1 % OP SUSP: 1.0000 [drp] | Freq: Four times a day (QID) | OPHTHALMIC | 0 refills | Status: DC

## 2023-11-12 ENCOUNTER — Encounter (INDEPENDENT_AMBULATORY_CARE_PROVIDER_SITE_OTHER): Payer: Self-pay | Admitting: Ophthalmology

## 2023-11-22 NOTE — Progress Notes (Signed)
 [facility] - Clinic Note  11/23/2023   CHIEF COMPLAINT Patient presents for Retina Follow Up  HISTORY OF PRESENT ILLNESS: Kimberly Richards is a 48 y.o. female who presents to the clinic today for:  HPI     Retina Follow Up   Patient presents with  Other.  In left eye.  This started 2 weeks ago.  I, the attending physician,  performed the HPI with the patient and updated documentation appropriately.        Comments   Patient here for 2 weeks retina follow up for FTMH OS. Patient states vision okay about the same. Can see the line of bubble. OS has a lot of eye twitching and pain. Using drops.      Last edited by Ronelle Coffee, MD on 11/26/2023  9:40 PM.    Pt states her gas bubble is starting to disappear, she is using PF QID and the ointment as needed, she states her eye is really sore and achy and twitches a lot   Referring physician: Fraz[place], [place], OD [address] [place] [place],  [place] [address]  HISTORICAL INFORMATION:  Selected notes from the MEDICAL RECORD NUMBER Referred by Dr. Micael Adas for concern of RD OS LEE:  Ocular Hx- PMH-   CURRENT MEDICATIONS: Current Outpatient Medications (Ophthalmic Drugs)  Medication Sig   Bromfenac  Sodium 0.075 % SOLN Place 1 drop into the left eye 4 (four) times daily.   prednisoLONE  acetate (PRED FORTE ) 1 % ophthalmic suspension Place 1 drop into the right eye 4 (four) times daily.   No current facility-administered medications for this visit. (Ophthalmic Drugs)   Current Outpatient Medications (Other)  Medication Sig   Azelaic Acid  15 % gel APPLY TOPICALLY TO AFFECTED AREA TWICE DAILY IN THE MORNING AND EVENING AFTER SKIN HAS BEEN THOROUGHLY WASHED AND PATTED DRY   B Complex-C-Folic Acid (HM VITAMIN B COMPLEX/VITAMIN C PO) Take 1 tablet by mouth daily.   clonazePAM  (KLONOPIN ) 0.5 MG tablet Take 0.5-1 tablets (0.25-0.5 mg total) by mouth 2 (two) times daily as needed for anxiety.   FLUoxetine  (PROZAC )  10 MG tablet Take 1 tablet (10 mg total) by mouth daily.   MAGNESIUM GLYCINATE PO Take 1 tablet by mouth at bedtime.   Multiple Vitamin (MULTIVITAMIN) tablet Take 1 tablet by mouth daily.   Probiotic Product (FORTIFY PROBIOTIC WOMENS EX ST PO) Take 1 tablet by mouth daily.   spironolactone  (ALDACTONE ) 50 MG tablet Take 1 tablet by mouth once daily   tretinoin  (RETIN-A ) 0.025 % cream Apply topically at bedtime.   vitamin E 180 MG (400 UNITS) capsule Take 400 Units by mouth 2 (two) times daily.   No current facility-administered medications for this visit. (Other)   REVIEW OF SYSTEMS: ROS   Positive for: Neurological, Skin, Eyes Negative for: Constitutional, Gastrointestinal, Genitourinary, Musculoskeletal, HENT, Endocrine, Cardiovascular, Respiratory, Psychiatric, Allergic/Imm, Heme/Lymph Last edited by Sylvan Evener, COA on 11/23/2023  7:50 AM.     ALLERGIES Allergies  Allergen Reactions   Stadol [Butorphanol]     Hallucinations    Sulfa  Antibiotics     Other reaction(s): gi distress   PAST MEDICAL HISTORY Past Medical History:  Diagnosis Date   Abnormal Pap smear of cervix    Acne    On Aldactone    Anemia    Anxiety with depression 12/18/2017   Cancer (HCC)    Constipation    H/O ovarian cancer    High cholesterol    Hot flashes  HPV (human papilloma virus) anogenital infection    Other hyperlipidemia 07/03/2022   Ovarian cyst    PONV (postoperative nausea and vomiting)    Primary hyperparathyroidism (HCC)    Past Surgical History:  Procedure Laterality Date   BREAST BIOPSY Right    CESAREAN SECTION     x3   EYE SURGERY Right    office procedure of the right eye   GAS INSERTION Left 05/16/2023   Procedure: INSERTION OF GAS;  Surgeon: Ronelle Coffee, MD;  Location[facility] OR;  Service: Ophthalmology;  Laterality: Left;   LAPAROSCOPIC HYSTERECTOMY     total   LAPAROTOMY     Ovarian mass removed  12/06/2011   Borderline Ovarian Cancer   PARS PLANA VITRECTOMY  Left 05/16/2023   Procedure: PARS PLANA VITRECTOMY WITH 25 GAUGE;  Surgeon: Ronelle Coffee, MD;  Location: [facility] OR;  Service: Ophthalmology;  Laterality: Left;   PARS PLANA VITRECTOMY 27 GAUGE WITH 25 GAUGE PORT Left 11/01/2023   Procedure: PARS PLANA VITRECTOMY FOR MACULAR HOLE WITH LASER AND INTRAOCULAR TAMPONADE; INSERTION OF GAS; MEMBRANE PEEL;  Surgeon: Ronelle Coffee, MD;  Location: [facility] OR;  Service: Ophthalmology;  Laterality: Left;   PHOTOCOAGULATION WITH LASER Left 05/16/2023   Procedure: PHOTOCOAGULATION WITH LASER;  Surgeon: Ronelle Coffee, MD;  Location: [facility] OR;  Service: Ophthalmology;  Laterality: Left;   SMALL INTESTINE SURGERY     FAMILY HISTORY Family History  Problem Relation Age of Onset   Hypertension Mother    Diabetes Mother    High blood pressure Mother    Depression Mother    Drug abuse Mother    Obesity Mother    Hypertension Father    Diabetes Father    High Cholesterol Father    Heart disease Father    Cancer Father    Drug abuse Father    Hypertension Maternal Grandmother    Diabetes Maternal Grandmother    Hypertension Maternal Grandfather    Diabetes Maternal Grandfather    Cancer Paternal Grandmother        breast   Hypertension Paternal Grandmother    Diabetes Paternal Grandmother    Hypertension Paternal Grandfather    Diabetes Paternal Grandfather    Rectal cancer Other    Stomach cancer Other    Esophageal cancer Other    Colon polyps Other    Colon cancer Other    SOCIAL HISTORY Social History   Tobacco Use   Smoking status: Never   Smokeless tobacco: Never  Vaping Use   Vaping status: Never Used  Substance Use Topics   Alcohol use: Yes    Comment: cocktails 3 x week   Drug use: No       OPHTHALMIC EXAM:  Base Eye Exam     Visual Acuity (Snellen - Linear)       Right Left   Dist cc 20/20 HM    Correction: Glasses         Tonometry (Tonopen, 7:47 AM)       Right Left   Pressure 18 13         Pupils       Dark  Light Shape React APD   Right 3 2 Round Brisk None   Left 5 5 Round NR None         Visual Fields (Counting fingers)       Left Right     Full   Restrictions Total superior temporal, inferior temporal, superior nasal, inferior nasal deficiencies  Extraocular Movement       Right Left    Full, Ortho Full, Ortho         Neuro/Psych     Oriented x3: Yes   Mood/Affect: Normal         Dilation     Both eyes: 1.0% Mydriacyl , 2.5% Phenylephrine  @ 7:46 AM           Slit Lamp and Fundus Exam     Slit Lamp Exam       Right Left   Lids/Lashes Normal Dermatochalasis - upper lid   Conjunctiva/Sclera mild melanosis mild melanosis, sutures intact -- dissolving   Cornea arcus, trace tear film debris Mild arcus, endo pigment   Anterior Chamber deep, clear, narrow temporal angle deep and clear, no cell or flare   Iris Round and reactive Round and dilated   Lens 1+ Cortical cataract 2-3+ Nuclear sclerosis, 2-3+ Cortical cataract, 3+ Posterior subcapsular cataract   Anterior Vitreous mild syneresis post vitrectomy, 55% gas fill         Fundus Exam       Right Left   Disc Pink and Sharp, mild PPP perfused, Sharp rim   C/D Ratio 0.6 0.4   Macula Flat, Good foveal reflex, RPE mottling, No heme or edema hazy view, macular hole closing, flat under gas, laser ST quad   Vessels attenuated, mild tortuosity attenuated, mild tortuosity   Periphery Attached, patches of lattice degeneration at 1100 and from 0430-0600, smaller patches of lattice at 0300 and 0730 equator -- good laser changes surrounding all lesions, no new RT/RD or lattice Attached, new row of supplemental laser posterior to previous laser, good laser surrounding large tears SN quad at 1000 and 1100, good 360 peripheral laser, focal fibrosis; PRE-OP: 2 large tears SN quad at 1000 and 1100 with +SRF; no other details visible elsewhere           Refraction     Wearing Rx       Sphere Cylinder Axis Add    Right -6.50 +4.75 094 +2.00   Left -6.25 +4.00 091 +2.00           IMAGING AND PROCEDURES  Imaging and Procedures for 11/23/2023         ASSESSMENT/PLAN:   ICD-10-CM   1. Full thickness macular hole of left eye  H35.342 CANCELED: OCT, Retina - OU - Both Eyes    2. Left retinal detachment  H33.22     3. Cystoid macular edema of left eye  H35.352     4. Vitreous hemorrhage of left eye (HCC)  H43.12     5. Lattice degeneration of right retina  H35.411     6. Retinal hole of right eye  H33.321     7. Combined forms of age-related cataract of left eye  H25.812        Macular hole, left eye   - OCT showed new full-thickness macular hole OS on 03.24.25 visit - POW3 s/p PPV/TissueBlue /MP/14% C3F8 OS, 03.27.25             - doing well             - retina attached and good gas bubble in place w/ mac hole closing             - IOP okay at 13  - gas bubble still 55%             - cont PF 4x/day OS  PSO ung at bedtime/PRN OS             - cont face down positioning 50% of time; avoid laying flat on back              - post op drop and positioning instructions reviewed              - tylenol /ibuprofen for pain  - f/u 3-4 weeks -- POV OS w/ DFE/OCT  2-4. Rhegmatogenous retinal detachment with vitreous hemorrhage and CME OS  - s/p STK OS #1 (01.13.25)  - pt reports progressive vision loss OS w/ onset on Saturday, 10.05.24, green blobs in vision  - denies photopsias  - vision became increasing blurry and presented to ED on Sunday, 10.06.24  - outpt follow up with Dr. Micael Adas on 10.07.24 - exam 10.8.24 w/ diffuse VH (no view of posterior pole), two large retinal tears at 1000 and 1100 (superonasal periphery) with +SRF - B-scan 10.8.24 ultrasound shows focal peripheral detachment in superonasal quad with tears at 1000 and 1100, matching exam - s/p PPV/PFO/EL/FAX/14% C3F8 OS, 10.09.2024             - retina attached and in good position -- good laser  around breaks and 360             - IOP OS 13  5,6. Lattice degeneration w/ atrophic holes, right eye - patches of lattice at 11 and from 0430-0600, smaller patches of lattice at 0300 and 0730 equator - discussed findings, prognosis, and treatment options including observation - s/p retinopexy OD (10.08.24) -- good laser surrounding all lesions - no new RT/RD or lattice - monitor  7. Mixed Cataract OS - The symptoms of cataract, surgical options, and treatments and risks were discussed with patient. - discussed diagnosis and progression, specifically post-vitrectomy cataract progression - will hold off on surgery until fully recovered from macular hole repair  Ophthalmic Meds Ordered this visit:  No orders of the defined types were placed in this encounter.    Return for f/u 3-4 weeks, FTMH OS, DFE, OCT, POV.  There are no Patient Instructions on file for this visit.  This document serves as a record of services personally performed by Jeanice Millard, MD, PhD. It was created on their behalf by Eller Gut COT, an ophthalmic technician. The creation of this record is the provider's dictation and/or activities during the visit.    Electronically signed by: Eller Gut COT 04.16.2025 9:43 PM  This document serves as a record of services personally performed by Jeanice Millard, MD, PhD. It was created on their behalf by Morley Arabia. Bevin Bucks, OA an ophthalmic technician. The creation of this record is the provider's dictation and/or activities during the visit.    Electronically signed by: Morley Arabia. Bevin Bucks, OA 11/26/23 9:43 PM  Jeanice Millard, M.D., Ph.D. Diseases & Surgery of the Retina and Vitreous [facility] 11/23/2023   I have reviewed the above documentation for accuracy and completeness, and I agree with the above. Jeanice Millard, M.D., Ph.D. 11/26/23 9:45 PM    Abbreviations: M myopia (nearsighted); A astigmatism; H hyperopia  (farsighted); P presbyopia; Mrx spectacle prescription;  CTL contact lenses; OD right eye; OS left eye; OU both eyes  XT exotropia; ET esotropia; PEK punctate epithelial keratitis; PEE punctate epithelial erosions; DES dry eye syndrome; MGD meibomian gland dysfunction; ATs artificial tears; PFAT's preservative free artificial tears; NSC nuclear sclerotic cataract; PSC posterior subcapsular cataract; ERM  epi-retinal membrane; PVD posterior vitreous detachment; RD retinal detachment; DM diabetes mellitus; DR diabetic retinopathy; NPDR non-proliferative diabetic retinopathy; PDR proliferative diabetic retinopathy; CSME clinically significant macular edema; DME diabetic macular edema; dbh dot blot hemorrhages; CWS cotton wool spot; POAG primary open angle glaucoma; C/D cup-to-disc ratio; HVF humphrey visual field; GVF goldmann visual field; OCT optical coherence tomography; IOP intraocular pressure; BRVO Branch retinal vein occlusion; CRVO central retinal vein occlusion; CRAO central retinal artery occlusion; BRAO branch retinal artery occlusion; RT retinal tear; SB scleral buckle; PPV pars plana vitrectomy; VH Vitreous hemorrhage; PRP panretinal laser photocoagulation; IVK intravitreal kenalog ; VMT vitreomacular traction; MH Macular hole;  NVD neovascularization of the disc; NVE neovascularization elsewhere; AREDS age related eye disease study; ARMD age related macular degeneration; POAG primary open angle glaucoma; EBMD epithelial/anterior basement membrane dystrophy; ACIOL anterior chamber intraocular lens; IOL intraocular lens; PCIOL posterior chamber intraocular lens; Phaco/IOL phacoemulsification with intraocular lens placement; PRK photorefractive keratectomy; LASIK laser assisted in situ keratomileusis; HTN hypertension; DM diabetes mellitus; COPD chronic obstructive pulmonary disease

## 2023-11-23 ENCOUNTER — Encounter (INDEPENDENT_AMBULATORY_CARE_PROVIDER_SITE_OTHER): Payer: Self-pay | Admitting: Ophthalmology

## 2023-11-23 ENCOUNTER — Ambulatory Visit (INDEPENDENT_AMBULATORY_CARE_PROVIDER_SITE_OTHER): Admitting: Ophthalmology

## 2023-11-23 DIAGNOSIS — H33321 Round hole, right eye: Secondary | ICD-10-CM

## 2023-11-23 DIAGNOSIS — H4312 Vitreous hemorrhage, left eye: Secondary | ICD-10-CM

## 2023-11-23 DIAGNOSIS — H3322 Serous retinal detachment, left eye: Secondary | ICD-10-CM

## 2023-11-23 DIAGNOSIS — H35352 Cystoid macular degeneration, left eye: Secondary | ICD-10-CM

## 2023-11-23 DIAGNOSIS — H25812 Combined forms of age-related cataract, left eye: Secondary | ICD-10-CM

## 2023-11-23 DIAGNOSIS — H35411 Lattice degeneration of retina, right eye: Secondary | ICD-10-CM

## 2023-11-23 DIAGNOSIS — H35342 Macular cyst, hole, or pseudohole, left eye: Secondary | ICD-10-CM

## 2023-11-26 ENCOUNTER — Encounter (INDEPENDENT_AMBULATORY_CARE_PROVIDER_SITE_OTHER): Payer: Self-pay | Admitting: Ophthalmology

## 2023-12-05 NOTE — Progress Notes (Signed)
 [facility] - Clinic Note  12/17/2023   CHIEF COMPLAINT Patient presents for Retina Follow Up  HISTORY OF PRESENT ILLNESS: Kimberly Richards is a 48 y.o. female who presents to the clinic today for:  HPI     Retina Follow Up   Patient presents with  Other.  In left eye.  This started 4 weeks ago.  I, the attending physician,  performed the HPI with the patient and updated documentation appropriately.        Comments   Patient states the bubble is a bit smaller. She feels the vision is still blurry. She is using Pred OS QID and ointment PRN.      Last edited by Ronelle Coffee, MD on 12/17/2023  8:31 AM.    Pt states she didn't realize her vision was getting better until she read the eye chart this morning, the bubble is continuing to shrink, she is using PF QID and PSO Ung as needed   Referring physician: Frazier, [place], OD [address] [place],  [place] [address]  HISTORICAL INFORMATION:  Selected notes from the MEDICAL RECORD NUMBER Referred by Dr. Micael Adas for concern of RD OS LEE:  Ocular Hx- PMH-   CURRENT MEDICATIONS: Current Outpatient Medications (Ophthalmic Drugs)  Medication Sig   Bromfenac  Sodium 0.075 % SOLN Place 1 drop into the left eye 4 (four) times daily.   prednisoLONE  acetate (PRED FORTE ) 1 % ophthalmic suspension Place 1 drop into the right eye 4 (four) times daily.   No current facility-administered medications for this visit. (Ophthalmic Drugs)   Current Outpatient Medications (Other)  Medication Sig   Azelaic Acid  15 % gel APPLY TOPICALLY TO AFFECTED AREA TWICE DAILY IN THE MORNING AND EVENING AFTER SKIN HAS BEEN THOROUGHLY WASHED AND PATTED DRY   B Complex-C-Folic Acid (HM VITAMIN B COMPLEX/VITAMIN C PO) Take 1 tablet by mouth daily.   clonazePAM  (KLONOPIN ) 0.5 MG tablet Take 0.5-1 tablets (0.25-0.5 mg total) by mouth 2 (two) times daily as needed for anxiety.   FLUoxetine  (PROZAC ) 10 MG tablet Take 1 tablet (10 mg  total) by mouth daily.   MAGNESIUM GLYCINATE PO Take 1 tablet by mouth at bedtime.   Multiple Vitamin (MULTIVITAMIN) tablet Take 1 tablet by mouth daily.   Probiotic Product (FORTIFY PROBIOTIC WOMENS EX ST PO) Take 1 tablet by mouth daily.   spironolactone  (ALDACTONE ) 50 MG tablet Take 1 tablet by mouth once daily   tretinoin  (RETIN-A ) 0.025 % cream Apply topically at bedtime.   vitamin E 180 MG (400 UNITS) capsule Take 400 Units by mouth 2 (two) times daily.   No current facility-administered medications for this visit. (Other)   REVIEW OF SYSTEMS: ROS   Positive for: Neurological, Skin, Eyes Negative for: Constitutional, Gastrointestinal, Genitourinary, Musculoskeletal, HENT, Endocrine, Cardiovascular, Respiratory, Psychiatric, Allergic/Imm, Heme/Lymph Last edited by Olene Berne, COT on 12/17/2023  7:40 AM.      ALLERGIES Allergies  Allergen Reactions   Stadol [Butorphanol]     Hallucinations    Sulfa  Antibiotics     Other reaction(s): gi distress   PAST MEDICAL HISTORY Past Medical History:  Diagnosis Date   Abnormal Pap smear of cervix    Acne    On Aldactone    Anemia    Anxiety with depression 12/18/2017   Cancer (HCC)    Constipation    H/O ovarian cancer    High cholesterol    Hot flashes    HPV (human papilloma virus) anogenital infection  Other hyperlipidemia 07/03/2022   Ovarian cyst    PONV (postoperative nausea and vomiting)    Primary hyperparathyroidism (HCC)    Past Surgical History:  Procedure Laterality Date   BREAST BIOPSY Right    CESAREAN SECTION     x3   EYE SURGERY Right    office procedure of the right eye   GAS INSERTION Left 05/16/2023   Procedure: INSERTION OF GAS;  Surgeon: Ronelle Coffee, MD;  Location: [facility] OR;  Service: Ophthalmology;  Laterality: Left;   LAPAROSCOPIC HYSTERECTOMY     total   LAPAROTOMY     Ovarian mass removed  12/06/2011   Borderline Ovarian Cancer   PARS PLANA VITRECTOMY Left 05/16/2023   Procedure:  PARS PLANA VITRECTOMY WITH 25 GAUGE;  Surgeon: Ronelle Coffee, MD;  Location: [facility] OR;  Service: Ophthalmology;  Laterality: Left;   PARS PLANA VITRECTOMY 27 GAUGE WITH 25 GAUGE PORT Left 11/01/2023   Procedure: PARS PLANA VITRECTOMY FOR MACULAR HOLE WITH LASER AND INTRAOCULAR TAMPONADE; INSERTION OF GAS; MEMBRANE PEEL;  Surgeon: Ronelle Coffee, MD;  Location: [facility] OR;  Service: Ophthalmology;  Laterality: Left;   PHOTOCOAGULATION WITH LASER Left 05/16/2023   Procedure: PHOTOCOAGULATION WITH LASER;  Surgeon: Ronelle Coffee, MD;  Location: [facility] OR;  Service: Ophthalmology;  Laterality: Left;   SMALL INTESTINE SURGERY     FAMILY HISTORY Family History  Problem Relation Age of Onset   Hypertension Mother    Diabetes Mother    High blood pressure Mother    Depression Mother    Drug abuse Mother    Obesity Mother    Hypertension Father    Diabetes Father    High Cholesterol Father    Heart disease Father    Cancer Father    Drug abuse Father    Hypertension Maternal Grandmother    Diabetes Maternal Grandmother    Hypertension Maternal Grandfather    Diabetes Maternal Grandfather    Cancer Paternal Grandmother        breast   Hypertension Paternal Grandmother    Diabetes Paternal Grandmother    Hypertension Paternal Grandfather    Diabetes Paternal Grandfather    Rectal cancer Other    Stomach cancer Other    Esophageal cancer Other    Colon polyps Other    Colon cancer Other    SOCIAL HISTORY Social History   Tobacco Use   Smoking status: Never   Smokeless tobacco: Never  Vaping Use   Vaping status: Never Used  Substance Use Topics   Alcohol use: Yes    Comment: cocktails 3 x week   Drug use: No       OPHTHALMIC EXAM:  Base Eye Exam     Visual Acuity (Snellen - Linear)       Right Left   Dist cc 20/25 20/350 +1   Dist ph cc  20/100 +1    Correction: Glasses         Tonometry (Tonopen, 7:45 AM)       Right Left   Pressure 17 19         Pupils       Dark  Light Shape React APD   Right 3 2 Round Brisk None   Left 3 2 Round Brisk None         Visual Fields       Left Right     Full   Restrictions Partial outer inferior nasal deficiency          Extraocular Movement  Right Left    Full, Ortho Full, Ortho         Neuro/Psych     Oriented x3: Yes   Mood/Affect: Normal         Dilation     Both eyes: 1.0% Mydriacyl , 2.5% Phenylephrine  @ 7:40 AM           Slit Lamp and Fundus Exam     Slit Lamp Exam       Right Left   Lids/Lashes Normal Normal   Conjunctiva/Sclera mild melanosis mild melanosis, sutures dissolving   Cornea arcus, trace tear film debris 1+ fine Punctate epithelial erosions, tear film debris   Anterior Chamber deep, clear, narrow temporal angle deep and clear, no cell or flare   Iris Round and reactive Round and dilated   Lens 1+ Cortical cataract 3+ Nuclear sclerosis, 2-3+ Cortical cataract, 3+ Posterior subcapsular cataract   Anterior Vitreous mild syneresis post vitrectomy, 20-25% gas fill         Fundus Exam       Right Left   Disc Pink and Sharp, mild PPP perfused, mild PPA, Sharp rim   C/D Ratio 0.6 0.2   Macula Flat, Good foveal reflex, RPE mottling, No heme or edema Flat, mac hole closed, No heme or edema, focal CR atrophy ST mac   Vessels attenuated, mild tortuosity attenuated, mild tortuosity   Periphery Attached, patches of lattice degeneration at 1100 and from 0430-0600, smaller patches of lattice at 0300 and 0730 equator -- good laser changes surrounding all lesions, no new RT/RD or lattice Attached, new row of supplemental laser posterior to previous laser, good laser surrounding large tears SN quad at 1000 and 1100, good 360 peripheral laser, focal fibrosis; PRE-OP: 2 large tears SN quad at 1000 and 1100 with +SRF; no other details visible elsewhere           Refraction     Wearing Rx       Sphere Cylinder Axis Add   Right -6.50 +4.75 094 +2.00   Left -6.25 +4.00  091 +2.00           IMAGING AND PROCEDURES  Imaging and Procedures for 12/17/2023  OCT, Retina - OU - Both Eyes        Right Eye Quality was good. Central Foveal Thickness: 276. Progression has been stable. Findings include normal foveal contour, no IRF, no SRF, vitreomacular adhesion .   Left Eye Quality was good. Central Foveal Thickness: 237. Progression has improved. Findings include normal foveal contour, no IRF, no SRF, outer retinal atrophy (Mac hole closed, ellipsoid thinning centrally, focal atrophy ST mac).   Notes  *Images captured and stored on drive  Diagnosis / Impression:  OD: NFP, no IRF/SRF OS: Mac hole closed, ellipsoid thinning centrally, focal atrophy ST mac  Clinical management:  See below  Abbreviations: NFP - Normal foveal profile. CME - cystoid macular edema. PED - pigment epithelial detachment. IRF - intraretinal fluid. SRF - subretinal fluid. EZ - ellipsoid zone. ERM - epiretinal membrane. ORA - outer retinal atrophy. ORT - outer retinal tubulation. SRHM - subretinal hyper-reflective material. IRHM - intraretinal hyper-reflective material            ASSESSMENT/PLAN:   ICD-10-CM   1. Full thickness macular hole of left eye  H35.342 OCT, Retina - OU - Both Eyes    2. Left retinal detachment  H33.22     3. Cystoid macular edema of left eye  H35.352     4.  Vitreous hemorrhage of left eye (HCC)  H43.12     5. Lattice degeneration of right retina  H35.411     6. Retinal hole of right eye  H33.321     7. Combined forms of age-related cataract of left eye  H25.812      Macular hole, left eye   - OCT showed new full-thickness macular hole OS on 03.24.25 visit - POW6 s/p PPV/TissueBlue /MP/14% C3F8 OS, 03.27.25             - doing well             - mac hole closed; retina attached; gas bubble ~20-25%             - IOP okay at 19             - cont PF 4x/day OS -- decrease to TID                         PSO ung at bedtime/PRN OS              - cont face down positioning 50% of time; avoid laying flat on back              - post op drop and positioning instructions reviewed   - f/u 3-4 weeks -- POV OS w/ DFE/OCT   2-4. Rhegmatogenous retinal detachment with vitreous hemorrhage and CME OS  - s/p STK OS #1 (01.13.25)  - pt reports progressive vision loss OS w/ onset on Saturday, 10.05.24, green blobs in vision  - denies photopsias  - vision became increasing blurry and presented to ED on Sunday, 10.06.24  - outpt follow up with Dr. Micael Adas on 10.07.24 - exam 10.8.24 w/ diffuse VH (no view of posterior pole), two large retinal tears at 1000 and 1100 (superonasal periphery) with +SRF - B-scan 10.8.24 ultrasound shows focal peripheral detachment in superonasal quad with tears at 1000 and 1100, matching exam - s/p PPV/PFO/EL/FAX/14% C3F8 OS, 10.09.2024             - retina attached and in good position -- good laser around breaks and 360  - IOP OS 19         5,6. Lattice degeneration w/ atrophic holes, right eye - patches of lattice at 11 and from 0430-0600, smaller patches of lattice at 0300 and 0730 equator - discussed findings, prognosis, and treatment options including observation - s/p retinopexy OD (10.08.24) -- good laser surrounding all lesions - no new RT/RD or lattice - monitor  7. Mixed Cataract OS - The symptoms of cataract, surgical options, and treatments and risks were discussed with patient. - discussed diagnosis and progression, specifically post-vitrectomy cataract progression - will hold off on surgery until fully recovered from macular hole repair - has consultation scheduled w/ Dr. Carloyn Chi -- surgery scheduled for July 2025  Ophthalmic Meds Ordered this visit:  No orders of the defined types were placed in this encounter.    Return for f/u 3-4 weeks, FTMH OS, DFE, OCT, POV.  There are no Patient Instructions on file for this visit.  This document serves as a record of services personally performed by Jeanice Millard, MD, PhD. It was created on their behalf by Mayola Specking, COA an ophthalmic technician. The creation of this record is the provider's dictation and/or activities during the visit.   Electronically created by Carrington Clack, COT  12/17/23  8:38 AM   This  document serves as a record of services personally performed by Jeanice Millard, MD, PhD. It was created on their behalf by Morley Arabia. Bevin Bucks, OA an ophthalmic technician. The creation of this record is the provider's dictation and/or activities during the visit.    Electronically signed by: Morley Arabia. Bevin Bucks, OA 12/17/23 8:38 AM  Jeanice Millard, M.D., Ph.D. Diseases & Surgery of the Retina and Vitreous [facility]  I have reviewed the above documentation for accuracy and completeness, and I agree with the above. Jeanice Millard, M.D., Ph.D. 12/17/23 8:38 AM   Abbreviations: M myopia (nearsighted); A astigmatism; H hyperopia (farsighted); P presbyopia; Mrx spectacle prescription;  CTL contact lenses; OD right eye; OS left eye; OU both eyes  XT exotropia; ET esotropia; PEK punctate epithelial keratitis; PEE punctate epithelial erosions; DES dry eye syndrome; MGD meibomian gland dysfunction; ATs artificial tears; PFAT's preservative free artificial tears; NSC nuclear sclerotic cataract; PSC posterior subcapsular cataract; ERM epi-retinal membrane; PVD posterior vitreous detachment; RD retinal detachment; DM diabetes mellitus; DR diabetic retinopathy; NPDR non-proliferative diabetic retinopathy; PDR proliferative diabetic retinopathy; CSME clinically significant macular edema; DME diabetic macular edema; dbh dot blot hemorrhages; CWS cotton wool spot; POAG primary open angle glaucoma; C/D cup-to-disc ratio; HVF humphrey visual field; GVF goldmann visual field; OCT optical coherence tomography; IOP intraocular pressure; BRVO Branch retinal vein occlusion; CRVO central retinal vein occlusion; CRAO central retinal artery  occlusion; BRAO branch retinal artery occlusion; RT retinal tear; SB scleral buckle; PPV pars plana vitrectomy; VH Vitreous hemorrhage; PRP panretinal laser photocoagulation; IVK intravitreal kenalog ; VMT vitreomacular traction; MH Macular hole;  NVD neovascularization of the disc; NVE neovascularization elsewhere; AREDS age related eye disease study; ARMD age related macular degeneration; POAG primary open angle glaucoma; EBMD epithelial/anterior basement membrane dystrophy; ACIOL anterior chamber intraocular lens; IOL intraocular lens; PCIOL posterior chamber intraocular lens; Phaco/IOL phacoemulsification with intraocular lens placement; PRK photorefractive keratectomy; LASIK laser assisted in situ keratomileusis; HTN hypertension; DM diabetes mellitus; COPD chronic obstructive pulmonary disease

## 2023-12-17 ENCOUNTER — Encounter (INDEPENDENT_AMBULATORY_CARE_PROVIDER_SITE_OTHER): Admitting: Ophthalmology

## 2023-12-17 ENCOUNTER — Encounter (INDEPENDENT_AMBULATORY_CARE_PROVIDER_SITE_OTHER): Payer: Self-pay | Admitting: Ophthalmology

## 2023-12-17 ENCOUNTER — Ambulatory Visit (INDEPENDENT_AMBULATORY_CARE_PROVIDER_SITE_OTHER): Admitting: Ophthalmology

## 2023-12-17 DIAGNOSIS — H35342 Macular cyst, hole, or pseudohole, left eye: Secondary | ICD-10-CM

## 2023-12-17 DIAGNOSIS — H25812 Combined forms of age-related cataract, left eye: Secondary | ICD-10-CM

## 2023-12-17 DIAGNOSIS — H35352 Cystoid macular degeneration, left eye: Secondary | ICD-10-CM | POA: Diagnosis not present

## 2023-12-17 DIAGNOSIS — H33321 Round hole, right eye: Secondary | ICD-10-CM

## 2023-12-17 DIAGNOSIS — H3322 Serous retinal detachment, left eye: Secondary | ICD-10-CM

## 2023-12-17 DIAGNOSIS — H35411 Lattice degeneration of retina, right eye: Secondary | ICD-10-CM

## 2023-12-17 DIAGNOSIS — H4312 Vitreous hemorrhage, left eye: Secondary | ICD-10-CM | POA: Diagnosis not present

## 2023-12-25 NOTE — Progress Notes (Signed)
 [facility] - Clinic Note  01/07/2024   CHIEF COMPLAINT Patient presents for Retina Follow Up  HISTORY OF PRESENT ILLNESS: Kimberly Richards is a 48 y.o. female who presents to the clinic today for:  HPI     Retina Follow Up   Patient presents with  Other.  In left eye.  This started 3 weeks ago.  I, the attending physician,  performed the HPI with the patient and updated documentation appropriately.        Comments   Patient here for 3 weeks retina follow up for FTMH OS. Patient states vision doing ok. Bubble getting very small. Almost gone. Sometimes has soreness than pain. Has light sensitivity.      Last edited by Ronelle Coffee, MD on 01/09/2024 11:16 PM.     Pt states her vision is about the same, she can still see the gas bubble, but it's very low, she has cataract sx consult June 26th and surgery is around July 22nd  Referring physician: Frazier, [place], OD [address] [place],  [place] [address]  HISTORICAL INFORMATION:  Selected notes from the MEDICAL RECORD NUMBER Referred by Dr. Micael Adas for concern of RD OS LEE:  Ocular Hx- PMH-   CURRENT MEDICATIONS: Current Outpatient Medications (Ophthalmic Drugs)  Medication Sig   Bromfenac  Sodium 0.075 % SOLN Place 1 drop into the left eye 4 (four) times daily. (Patient not taking: Reported on 01/09/2024)   prednisoLONE  acetate (PRED FORTE ) 1 % ophthalmic suspension Place 1 drop into the right eye 4 (four) times daily.   No current facility-administered medications for this visit. (Ophthalmic Drugs)   Current Outpatient Medications (Other)  Medication Sig   Azelaic Acid  15 % gel APPLY TOPICALLY TO AFFECTED AREA TWICE DAILY IN THE MORNING AND EVENING AFTER SKIN HAS BEEN THOROUGHLY WASHED AND PATTED DRY   B Complex-C-Folic Acid (HM VITAMIN B COMPLEX/VITAMIN C PO) Take 1 tablet by mouth daily. (Patient not taking: Reported on 01/09/2024)   clonazePAM  (KLONOPIN ) 0.5 MG tablet Take 0.5-1 tablets  (0.25-0.5 mg total) by mouth 2 (two) times daily as needed for anxiety.   FLUoxetine  (PROZAC ) 10 MG tablet Take 1 tablet (10 mg total) by mouth daily.   MAGNESIUM GLYCINATE PO Take 1 tablet by mouth at bedtime.   Multiple Vitamin (MULTIVITAMIN) tablet Take 1 tablet by mouth daily.   Probiotic Product (FORTIFY PROBIOTIC WOMENS EX ST PO) Take 1 tablet by mouth daily.   spironolactone  (ALDACTONE ) 50 MG tablet Take 1 tablet by mouth once daily   tretinoin  (RETIN-A ) 0.025 % cream Apply topically at bedtime.   vitamin E 180 MG (400 UNITS) capsule Take 400 Units by mouth 2 (two) times daily.   No current facility-administered medications for this visit. (Other)   REVIEW OF SYSTEMS:    ALLERGIES Allergies  Allergen Reactions   Stadol [Butorphanol]     Hallucinations    Sulfa  Antibiotics     Other reaction(s): gi distress   PAST MEDICAL HISTORY Past Medical History:  Diagnosis Date   Abnormal Pap smear of cervix    Acne    On Aldactone    Anemia    Anxiety with depression 12/18/2017   Cancer (HCC)    Constipation    H/O ovarian cancer    High cholesterol    Hot flashes    HPV (human papilloma virus) anogenital infection    Other hyperlipidemia 07/03/2022   Ovarian cyst    PONV (postoperative nausea and vomiting)  Primary hyperparathyroidism (HCC)    Past Surgical History:  Procedure Laterality Date   BREAST BIOPSY Right    CESAREAN SECTION     x3   EYE SURGERY Right    office procedure of the right eye   GAS INSERTION Left 05/16/2023   Procedure: INSERTION OF GAS;  Surgeon: Ronelle Coffee, MD;  Location: [facility] OR;  Service: Ophthalmology;  Laterality: Left;   LAPAROSCOPIC HYSTERECTOMY     total   LAPAROTOMY     Ovarian mass removed  12/06/2011   Borderline Ovarian Cancer   PARS PLANA VITRECTOMY Left 05/16/2023   Procedure: PARS PLANA VITRECTOMY WITH 25 GAUGE;  Surgeon: Ronelle Coffee, MD;  Location: [facility] OR;  Service: Ophthalmology;  Laterality: Left;   PARS PLANA  VITRECTOMY 27 GAUGE WITH 25 GAUGE PORT Left 11/01/2023   Procedure: PARS PLANA VITRECTOMY FOR MACULAR HOLE WITH LASER AND INTRAOCULAR TAMPONADE; INSERTION OF GAS; MEMBRANE PEEL;  Surgeon: Ronelle Coffee, MD;  Location: [facility] OR;  Service: Ophthalmology;  Laterality: Left;   PHOTOCOAGULATION WITH LASER Left 05/16/2023   Procedure: PHOTOCOAGULATION WITH LASER;  Surgeon: Ronelle Coffee, MD;  Location: [facility] OR;  Service: Ophthalmology;  Laterality: Left;   SMALL INTESTINE SURGERY     FAMILY HISTORY Family History  Problem Relation Age of Onset   Hypertension Mother    Diabetes Mother    High blood pressure Mother    Depression Mother    Drug abuse Mother    Obesity Mother    Hypertension Father    Diabetes Father    High Cholesterol Father    Heart disease Father    Cancer Father    Drug abuse Father    Hypertension Maternal Grandmother    Diabetes Maternal Grandmother    Hypertension Maternal Grandfather    Diabetes Maternal Grandfather    Breast cancer Paternal Grandmother    Cancer Paternal Grandmother        breast   Hypertension Paternal Grandmother    Diabetes Paternal Grandmother    Hypertension Paternal Grandfather    Diabetes Paternal Grandfather    Rectal cancer Other    Stomach cancer Other    Esophageal cancer Other    Colon polyps Other    Colon cancer Other    SOCIAL HISTORY Social History   Tobacco Use   Smoking status: Never   Smokeless tobacco: Never  Vaping Use   Vaping status: Never Used  Substance Use Topics   Alcohol use: Yes    Comment: cocktails 3 x week   Drug use: No       OPHTHALMIC EXAM:  Base Eye Exam     Visual Acuity (Snellen - Linear)       Right Left   Dist cc 20/20 20/300 -2   Dist ph cc  20/150    Correction: Glasses         Tonometry (Tonopen, 8:28 AM)       Right Left   Pressure 17 25,25  Brimonidine  and Cosopt  given OS @8 :29 am        Pupils       Dark Light Shape React APD   Right 3 2 Round Brisk None   Left 3  2 Round Brisk None         Visual Fields (Counting fingers)       Left Right    Full Full         Extraocular Movement       Right Left    Full,  Ortho Full, Ortho         Neuro/Psych     Oriented x3: Yes   Mood/Affect: Normal         Dilation     Both eyes: 1.0% Mydriacyl , 2.5% Phenylephrine  @ 8:27 AM           Slit Lamp and Fundus Exam     Slit Lamp Exam       Right Left   Lids/Lashes Normal Normal   Conjunctiva/Sclera mild melanosis mild melanosis, sutures dissolving   Cornea arcus, trace tear film debris 2+ central Punctate epithelial erosions   Anterior Chamber deep, clear, narrow temporal angle Moderate depth, 1-2+ cell and pigment   Iris Round and reactive Round and dilated   Lens 1+ Cortical cataract 3+ Nuclear sclerosis, 2-3+ Cortical cataract, 3+ Posterior subcapsular cataract   Anterior Vitreous mild syneresis post vitrectomy, 1-2% gas fill         Fundus Exam       Right Left   Disc Pink and Sharp, mild PPP mild PPA, mild Pallor, Sharp rim   C/D Ratio 0.6 0.2   Macula Flat, Good foveal reflex, RPE mottling, No heme or edema Flat, mac hole closed, No heme or edema, focal CR atrophy ST mac   Vessels attenuated, mild tortuosity attenuated, mild tortuosity   Periphery Attached, patches of lattice degeneration at 1100 and from 0430-0600, smaller patches of lattice at 0300 and 0730 equator -- good laser changes surrounding all lesions, no new RT/RD or lattice Attached, new row of supplemental laser posterior to previous laser, good laser surrounding large tears SN quad at 1000 and 1100, good 360 peripheral laser, focal fibrosis; PRE-OP: 2 large tears SN quad at 1000 and 1100 with +SRF; no other details visible elsewhere           Refraction     Wearing Rx       Sphere Cylinder Axis Add   Right -6.50 +4.75 094 +2.00   Left -6.25 +4.00 091 +2.00           IMAGING AND PROCEDURES  Imaging and Procedures for 01/07/2024  OCT, Retina -  OU - Both Eyes        Right Eye Quality was good. Central Foveal Thickness: 278. Progression has been stable. Findings include normal foveal contour, no IRF, no SRF, vitreomacular adhesion .   Left Eye Quality was borderline. Central Foveal Thickness: 247. Progression has been stable. Findings include normal foveal contour, no IRF, no SRF, outer retinal atrophy (Mac hole closed, ellipsoid thinning centrally, focal atrophy ST mac).   Notes  *Images captured and stored on drive  Diagnosis / Impression:  OD: NFP, no IRF/SRF OS: Mac hole closed, ellipsoid thinning centrally, focal atrophy ST mac  Clinical management:  See below  Abbreviations: NFP - Normal foveal profile. CME - cystoid macular edema. PED - pigment epithelial detachment. IRF - intraretinal fluid. SRF - subretinal fluid. EZ - ellipsoid zone. ERM - epiretinal membrane. ORA - outer retinal atrophy. ORT - outer retinal tubulation. SRHM - subretinal hyper-reflective material. IRHM - intraretinal hyper-reflective material           ASSESSMENT/PLAN:   ICD-10-CM   1. Full thickness macular hole of left eye  H35.342 OCT, Retina - OU - Both Eyes    2. Left retinal detachment  H33.22     3. Cystoid macular edema of left eye  H35.352     4. Vitreous hemorrhage of left eye (HCC)  H43.12  5. Lattice degeneration of right retina  H35.411     6. Retinal hole of right eye  H33.321     7. Combined forms of age-related cataract of left eye  H25.812      Macular hole, left eye   - OCT showed new full-thickness macular hole OS on 03.24.25 visit - s/p PPV/TissueBlue /MP/14% C3F8 OS, 03.27.25             - doing well             - mac hole closed; retina attached; gas bubble ~1-2%             - IOP increased to 25 today -- will start Cosopt  BID OS             - cont PF TID OS -- decrease to BID for 2 weeks, then once a day until cataract sx                         PSO ung at bedtime/PRN OS             - cont face down  positioning 50% of time; avoid laying flat on back              - post op drop and positioning instructions reviewed   - f/u August 5th or later -- POV OS w/ DFE/OCT   2-4. Rhegmatogenous retinal detachment with vitreous hemorrhage and CME OS  - s/p STK OS #1 (01.13.25)  - pt reports progressive vision loss OS w/ onset on Saturday, 10.05.24, green blobs in vision  - denies photopsias  - vision became increasing blurry and presented to ED on Sunday, 10.06.24  - outpt follow up with Dr. Micael Adas on 10.07.24 - exam 10.8.24 w/ diffuse VH (no view of posterior pole), two large retinal tears at 1000 and 1100 (superonasal periphery) with +SRF - B-scan 10.8.24 ultrasound shows focal peripheral detachment in superonasal quad with tears at 1000 and 1100, matching exam - s/p PPV/PFO/EL/FAX/14% C3F8 OS, 10.09.2024             - retina attached and in good position -- good laser around breaks and 360  - IOP OS 25         5,6. Lattice degeneration w/ atrophic holes, right eye - patches of lattice at 11 and from 0430-0600, smaller patches of lattice at 0300 and 0730 equator - discussed findings, prognosis, and treatment options including observation - s/p retinopexy OD (10.08.24) -- good laser surrounding all lesions - no new RT/RD or lattice - monitor  7. Mixed Cataract OS - The symptoms of cataract, surgical options, and treatments and risks were discussed with patient. - discussed diagnosis and progression, specifically post-vitrectomy cataract progression - will hold off on surgery until fully recovered from macular hole repair - has consultation scheduled w/ Dr. Carloyn Chi on June 26th -- surgery scheduled for July 2025  Ophthalmic Meds Ordered this visit:  No orders of the defined types were placed in this encounter.    Return for f/u around August 5th, FTMH OS , DFE, OCT.  There are no Patient Instructions on file for this visit.  This document serves as a record of services personally performed  by Jeanice Millard, MD, PhD. It was created on their behalf by Mayola Specking, COA an ophthalmic technician. The creation of this record is the provider's dictation and/or activities during the visit.   Electronically signed by: Reeta Canon  Martina Sledge, COT  01/09/24  11:16 PM   This document serves as a record of services personally performed by Jeanice Millard, MD, PhD. It was created on their behalf by Morley Arabia. Bevin Bucks, OA an ophthalmic technician. The creation of this record is the provider's dictation and/or activities during the visit.    Electronically signed by: Morley Arabia. Bevin Bucks, OA 01/09/24 11:16 PM  Jeanice Millard, M.D., Ph.D. Diseases & Surgery of the Retina and Vitreous [facility]  I have reviewed the above documentation for accuracy and completeness, and I agree with the above. Jeanice Millard, M.D., Ph.D. 01/09/24 11:18 PM    Abbreviations: M myopia (nearsighted); A astigmatism; H hyperopia (farsighted); P presbyopia; Mrx spectacle prescription;  CTL contact lenses; OD right eye; OS left eye; OU both eyes  XT exotropia; ET esotropia; PEK punctate epithelial keratitis; PEE punctate epithelial erosions; DES dry eye syndrome; MGD meibomian gland dysfunction; ATs artificial tears; PFAT's preservative free artificial tears; NSC nuclear sclerotic cataract; PSC posterior subcapsular cataract; ERM epi-retinal membrane; PVD posterior vitreous detachment; RD retinal detachment; DM diabetes mellitus; DR diabetic retinopathy; NPDR non-proliferative diabetic retinopathy; PDR proliferative diabetic retinopathy; CSME clinically significant macular edema; DME diabetic macular edema; dbh dot blot hemorrhages; CWS cotton wool spot; POAG primary open angle glaucoma; C/D cup-to-disc ratio; HVF humphrey visual field; GVF goldmann visual field; OCT optical coherence tomography; IOP intraocular pressure; BRVO Branch retinal vein occlusion; CRVO central retinal vein occlusion; CRAO central  retinal artery occlusion; BRAO branch retinal artery occlusion; RT retinal tear; SB scleral buckle; PPV pars plana vitrectomy; VH Vitreous hemorrhage; PRP panretinal laser photocoagulation; IVK intravitreal kenalog ; VMT vitreomacular traction; MH Macular hole;  NVD neovascularization of the disc; NVE neovascularization elsewhere; AREDS age related eye disease study; ARMD age related macular degeneration; POAG primary open angle glaucoma; EBMD epithelial/anterior basement membrane dystrophy; ACIOL anterior chamber intraocular lens; IOL intraocular lens; PCIOL posterior chamber intraocular lens; Phaco/IOL phacoemulsification with intraocular lens placement; PRK photorefractive keratectomy; LASIK laser assisted in situ keratomileusis; HTN hypertension; DM diabetes mellitus; COPD chronic obstructive pulmonary disease

## 2023-12-27 ENCOUNTER — Encounter: Admitting: Family Medicine

## 2024-01-07 ENCOUNTER — Ambulatory Visit (INDEPENDENT_AMBULATORY_CARE_PROVIDER_SITE_OTHER): Admitting: Ophthalmology

## 2024-01-07 ENCOUNTER — Encounter (INDEPENDENT_AMBULATORY_CARE_PROVIDER_SITE_OTHER): Payer: Self-pay | Admitting: Ophthalmology

## 2024-01-07 DIAGNOSIS — H35352 Cystoid macular degeneration, left eye: Secondary | ICD-10-CM

## 2024-01-07 DIAGNOSIS — H4312 Vitreous hemorrhage, left eye: Secondary | ICD-10-CM

## 2024-01-07 DIAGNOSIS — H33321 Round hole, right eye: Secondary | ICD-10-CM

## 2024-01-07 DIAGNOSIS — H35342 Macular cyst, hole, or pseudohole, left eye: Secondary | ICD-10-CM

## 2024-01-07 DIAGNOSIS — H25812 Combined forms of age-related cataract, left eye: Secondary | ICD-10-CM

## 2024-01-07 DIAGNOSIS — H3322 Serous retinal detachment, left eye: Secondary | ICD-10-CM | POA: Diagnosis not present

## 2024-01-07 DIAGNOSIS — H35411 Lattice degeneration of retina, right eye: Secondary | ICD-10-CM

## 2024-01-08 ENCOUNTER — Ambulatory Visit
Admission: RE | Admit: 2024-01-08 | Discharge: 2024-01-08 | Disposition: A | Discharge status: Home / Self Care | Payer: BC Managed Care – PPO | Source: Ambulatory Visit | Attending: Family Medicine

## 2024-01-08 ENCOUNTER — Ambulatory Visit
Admission: RE | Admit: 2024-01-08 | Discharge: 2024-01-08 | Disposition: A | Discharge status: Home / Self Care | Payer: BC Managed Care – PPO | Source: Ambulatory Visit | Attending: Family Medicine | Admitting: Family Medicine

## 2024-01-08 DIAGNOSIS — N6325 Unspecified lump in the left breast, overlapping quadrants: Secondary | ICD-10-CM | POA: Diagnosis not present

## 2024-01-08 DIAGNOSIS — N6315 Unspecified lump in the right breast, overlapping quadrants: Secondary | ICD-10-CM | POA: Diagnosis not present

## 2024-01-08 DIAGNOSIS — N6311 Unspecified lump in the right breast, upper outer quadrant: Secondary | ICD-10-CM | POA: Diagnosis not present

## 2024-01-08 DIAGNOSIS — N6310 Unspecified lump in the right breast, unspecified quadrant: Secondary | ICD-10-CM

## 2024-01-08 DIAGNOSIS — N6320 Unspecified lump in the left breast, unspecified quadrant: Secondary | ICD-10-CM

## 2024-01-09 ENCOUNTER — Encounter: Admitting: Obstetrics & Gynecology

## 2024-01-09 ENCOUNTER — Encounter (INDEPENDENT_AMBULATORY_CARE_PROVIDER_SITE_OTHER): Payer: Self-pay | Admitting: Ophthalmology

## 2024-01-23 NOTE — Progress Notes (Signed)
Pt not seen on this date.  

## 2024-01-31 DIAGNOSIS — H40012 Open angle with borderline findings, low risk, left eye: Secondary | ICD-10-CM | POA: Diagnosis not present

## 2024-01-31 DIAGNOSIS — H25813 Combined forms of age-related cataract, bilateral: Secondary | ICD-10-CM | POA: Diagnosis not present

## 2024-02-11 ENCOUNTER — Telehealth: Payer: Self-pay

## 2024-02-11 NOTE — Telephone Encounter (Signed)
 Left voicemail for patient to call back and reschedule her appointment on 7/23 that was scheduled with Dr. Corene. Cancelled appointment due to provider leaving the practice.

## 2024-02-26 DIAGNOSIS — H268 Other specified cataract: Secondary | ICD-10-CM | POA: Diagnosis not present

## 2024-02-26 DIAGNOSIS — H25813 Combined forms of age-related cataract, bilateral: Secondary | ICD-10-CM | POA: Diagnosis not present

## 2024-02-27 ENCOUNTER — Encounter: Admitting: Obstetrics & Gynecology

## 2024-03-03 NOTE — Progress Notes (Deleted)
 [facility] - Clinic Note  03/11/2024   CHIEF COMPLAINT Patient presents for No chief complaint on file.  HISTORY OF PRESENT ILLNESS: Kimberly Richards is a 48 y.o. female who presents to the clinic today for:    Pt states her vision is about the same, she can still see the gas bubble, but it's very low, she has cataract sx consult June 26th and surgery is around July 22nd  Referring physician: Frazier, [place], OD [address] [place],  [place] [address]  HISTORICAL INFORMATION:  Selected notes from the MEDICAL RECORD NUMBER Referred by Dr. Vivian for concern of RD OS LEE:  Ocular Hx- PMH-   CURRENT MEDICATIONS: Current Outpatient Medications (Ophthalmic Drugs)  Medication Sig   Bromfenac  Sodium 0.075 % SOLN Place 1 drop into the left eye 4 (four) times daily. (Patient not taking: Reported on 01/09/2024)   prednisoLONE  acetate (PRED FORTE ) 1 % ophthalmic suspension Place 1 drop into the right eye 4 (four) times daily.   No current facility-administered medications for this visit. (Ophthalmic Drugs)   Current Outpatient Medications (Other)  Medication Sig   Azelaic Acid  15 % gel APPLY TOPICALLY TO AFFECTED AREA TWICE DAILY IN THE MORNING AND EVENING AFTER SKIN HAS BEEN THOROUGHLY WASHED AND PATTED DRY   B Complex-C-Folic Acid (HM VITAMIN B COMPLEX/VITAMIN C PO) Take 1 tablet by mouth daily. (Patient not taking: Reported on 01/09/2024)   clonazePAM  (KLONOPIN ) 0.5 MG tablet Take 0.5-1 tablets (0.25-0.5 mg total) by mouth 2 (two) times daily as needed for anxiety.   FLUoxetine  (PROZAC ) 10 MG tablet Take 1 tablet (10 mg total) by mouth daily.   MAGNESIUM GLYCINATE PO Take 1 tablet by mouth at bedtime.   Multiple Vitamin (MULTIVITAMIN) tablet Take 1 tablet by mouth daily.   Probiotic Product (FORTIFY PROBIOTIC WOMENS EX ST PO) Take 1 tablet by mouth daily.   spironolactone  (ALDACTONE ) 50 MG tablet Take 1 tablet by mouth once daily   tretinoin  (RETIN-A )  0.025 % cream Apply topically at bedtime.   vitamin E 180 MG (400 UNITS) capsule Take 400 Units by mouth 2 (two) times daily.   No current facility-administered medications for this visit. (Other)   REVIEW OF SYSTEMS:    ALLERGIES Allergies  Allergen Reactions   Stadol [Butorphanol]     Hallucinations    Sulfa  Antibiotics     Other reaction(s): gi distress   PAST MEDICAL HISTORY Past Medical History:  Diagnosis Date   Abnormal Pap smear of cervix    Acne    On Aldactone    Anemia    Anxiety with depression 12/18/2017   Cancer (HCC)    Constipation    H/O ovarian cancer    High cholesterol    Hot flashes    HPV (human papilloma virus) anogenital infection    Other hyperlipidemia 07/03/2022   Ovarian cyst    PONV (postoperative nausea and vomiting)    Primary hyperparathyroidism (HCC)    Past Surgical History:  Procedure Laterality Date   BREAST BIOPSY Right    CESAREAN SECTION     x3   EYE SURGERY Right    office procedure of the right eye   GAS INSERTION Left 05/16/2023   Procedure: INSERTION OF GAS;  Surgeon: Valdemar Rogue, MD;  Location: [facility] OR;  Service: Ophthalmology;  Laterality: Left;   LAPAROSCOPIC HYSTERECTOMY     total   LAPAROTOMY     Ovarian mass removed  12/06/2011   Borderline Ovarian Cancer  PARS PLANA VITRECTOMY Left 05/16/2023   Procedure: PARS PLANA VITRECTOMY WITH 25 GAUGE;  Surgeon: Valdemar Rogue, MD;  Location: [facility] OR;  Service: Ophthalmology;  Laterality: Left;   PARS PLANA VITRECTOMY 27 GAUGE WITH 25 GAUGE PORT Left 11/01/2023   Procedure: PARS PLANA VITRECTOMY FOR MACULAR HOLE WITH LASER AND INTRAOCULAR TAMPONADE; INSERTION OF GAS; MEMBRANE PEEL;  Surgeon: Valdemar Rogue, MD;  Location: [facility] OR;  Service: Ophthalmology;  Laterality: Left;   PHOTOCOAGULATION WITH LASER Left 05/16/2023   Procedure: PHOTOCOAGULATION WITH LASER;  Surgeon: Valdemar Rogue, MD;  Location: [facility] OR;  Service: Ophthalmology;  Laterality: Left;   SMALL INTESTINE SURGERY      FAMILY HISTORY Family History  Problem Relation Age of Onset   Hypertension Mother    Diabetes Mother    High blood pressure Mother    Depression Mother    Drug abuse Mother    Obesity Mother    Hypertension Father    Diabetes Father    High Cholesterol Father    Heart disease Father    Cancer Father    Drug abuse Father    Hypertension Maternal Grandmother    Diabetes Maternal Grandmother    Hypertension Maternal Grandfather    Diabetes Maternal Grandfather    Breast cancer Paternal Grandmother    Cancer Paternal Grandmother        breast   Hypertension Paternal Grandmother    Diabetes Paternal Grandmother    Hypertension Paternal Grandfather    Diabetes Paternal Grandfather    Rectal cancer Other    Stomach cancer Other    Esophageal cancer Other    Colon polyps Other    Colon cancer Other    SOCIAL HISTORY Social History   Tobacco Use   Smoking status: Never   Smokeless tobacco: Never  Vaping Use   Vaping status: Never Used  Substance Use Topics   Alcohol use: Yes    Comment: cocktails 3 x week   Drug use: No       OPHTHALMIC EXAM:  Not recorded    IMAGING AND PROCEDURES  Imaging and Procedures for 03/11/2024         ASSESSMENT/PLAN:   ICD-10-CM   1. Full thickness macular hole of left eye  H35.342     2. Left retinal detachment  H33.22     3. Cystoid macular edema of left eye  H35.352     4. Vitreous hemorrhage of left eye (HCC)  H43.12     5. Lattice degeneration of right retina  H35.411     6. Retinal hole of right eye  H33.321     7. Combined forms of age-related cataract of left eye  H25.812       Macular hole, left eye   - OCT showed new full-thickness macular hole OS on 03.24.25 visit - s/p PPV/TissueBlue /MP/14% C3F8 OS, 03.27.25             - doing well             - mac hole closed; retina attached; gas bubble ~1-2%             - IOP increased to 25 today -- will start Cosopt  BID OS             - cont PF TID OS --  decrease to BID for 2 weeks, then once a day until cataract sx  PSO ung at bedtime/PRN OS             - cont face down positioning 50% of time; avoid laying flat on back              - post op drop and positioning instructions reviewed   - f/u August 5th or later -- POV OS w/ DFE/OCT   2-4. Rhegmatogenous retinal detachment with vitreous hemorrhage and CME OS  - s/p STK OS #1 (01.13.25)  - pt reports progressive vision loss OS w/ onset on Saturday, 10.05.24, green blobs in vision  - denies photopsias  - vision became increasing blurry and presented to ED on Sunday, 10.06.24  - outpt follow up with Dr. Vivian on 10.07.24 - exam 10.8.24 w/ diffuse VH (no view of posterior pole), two large retinal tears at 1000 and 1100 (superonasal periphery) with +SRF - B-scan 10.8.24 ultrasound shows focal peripheral detachment in superonasal quad with tears at 1000 and 1100, matching exam - s/p PPV/PFO/EL/FAX/14% C3F8 OS, 10.09.2024             - retina attached and in good position -- good laser around breaks and 360  - IOP OS 25         5,6. Lattice degeneration w/ atrophic holes, right eye - patches of lattice at 11 and from 0430-0600, smaller patches of lattice at 0300 and 0730 equator - discussed findings, prognosis, and treatment options including observation - s/p retinopexy OD (10.08.24) -- good laser surrounding all lesions - no new RT/RD or lattice - monitor  7. Mixed Cataract OS - The symptoms of cataract, surgical options, and treatments and risks were discussed with patient. - discussed diagnosis and progression, specifically post-vitrectomy cataract progression - will hold off on surgery until fully recovered from macular hole repair - has consultation scheduled w/ Dr. Fleeta on June 26th -- surgery scheduled for July 2025  Ophthalmic Meds Ordered this visit:  No orders of the defined types were placed in this encounter.    No follow-ups on file.  There are no  Patient Instructions on file for this visit.  This document serves as a record of services personally performed by Redell JUDITHANN Hans, MD, PhD. It was created on their behalf by Avelina Pereyra, COA an ophthalmic technician. The creation of this record is the provider's dictation and/or activities during the visit.   Electronically signed by: Avelina GORMAN Pereyra, COT  03/03/24  2:40 PM     Redell JUDITHANN Hans, M.D., Ph.D. Diseases & Surgery of the Retina and Vitreous [facility]     Abbreviations: M myopia (nearsighted); A astigmatism; H hyperopia (farsighted); P presbyopia; Mrx spectacle prescription;  CTL contact lenses; OD right eye; OS left eye; OU both eyes  XT exotropia; ET esotropia; PEK punctate epithelial keratitis; PEE punctate epithelial erosions; DES dry eye syndrome; MGD meibomian gland dysfunction; ATs artificial tears; PFAT's preservative free artificial tears; NSC nuclear sclerotic cataract; PSC posterior subcapsular cataract; ERM epi-retinal membrane; PVD posterior vitreous detachment; RD retinal detachment; DM diabetes mellitus; DR diabetic retinopathy; NPDR non-proliferative diabetic retinopathy; PDR proliferative diabetic retinopathy; CSME clinically significant macular edema; DME diabetic macular edema; dbh dot blot hemorrhages; CWS cotton wool spot; POAG primary open angle glaucoma; C/D cup-to-disc ratio; HVF humphrey visual field; GVF goldmann visual field; OCT optical coherence tomography; IOP intraocular pressure; BRVO Branch retinal vein occlusion; CRVO central retinal vein occlusion; CRAO central retinal artery occlusion; BRAO branch retinal artery occlusion; RT retinal tear; SB  scleral buckle; PPV pars plana vitrectomy; VH Vitreous hemorrhage; PRP panretinal laser photocoagulation; IVK intravitreal kenalog ; VMT vitreomacular traction; MH Macular hole;  NVD neovascularization of the disc; NVE neovascularization elsewhere; AREDS age related eye disease study; ARMD  age related macular degeneration; POAG primary open angle glaucoma; EBMD epithelial/anterior basement membrane dystrophy; ACIOL anterior chamber intraocular lens; IOL intraocular lens; PCIOL posterior chamber intraocular lens; Phaco/IOL phacoemulsification with intraocular lens placement; PRK photorefractive keratectomy; LASIK laser assisted in situ keratomileusis; HTN hypertension; DM diabetes mellitus; COPD chronic obstructive pulmonary disease

## 2024-03-06 NOTE — Progress Notes (Signed)
 [facility] - Clinic Note  03/11/2024   CHIEF COMPLAINT Patient presents for Retina Follow Up  HISTORY OF PRESENT ILLNESS: Kimberly Richards is a 48 y.o. female who presents to the clinic today for:  HPI     Retina Follow Up   Diagnosis: Macular hole.  In left eye.  This started 10 months ago.  Duration of 9 weeks.  I, the attending physician,  performed the HPI with the patient and updated documentation appropriately.        Comments   Pt states she had cataract sx OS about 2 weeks ago and also had yag laser in the OS. Pt denies FOL/floaters/pain. Pt does not use ats. Pt is currently on s/p cataract drop qid.      Last edited by Valdemar Rogue, MD on 03/11/2024 11:17 PM.    Pt had cataract sx and Yag OS with Dr. Fleeta since she was here last, she is on a combination drop per Dr. Fleeta and Cosopt  BID OS  Referring physician: [place], [place], OD [address] [place] [place],  [place] [address]  HISTORICAL INFORMATION:  Selected notes from the MEDICAL RECORD NUMBER Referred by Dr. Vivian for concern of RD OS LEE:  Ocular Hx- PMH-   CURRENT MEDICATIONS: Current Outpatient Medications (Ophthalmic Drugs)  Medication Sig   Bromfenac  Sodium 0.075 % SOLN Place 1 drop into the left eye 4 (four) times daily. (Patient not taking: Reported on 01/09/2024)   prednisoLONE  acetate (PRED FORTE ) 1 % ophthalmic suspension Place 1 drop into the right eye 4 (four) times daily.   No current facility-administered medications for this visit. (Ophthalmic Drugs)   Current Outpatient Medications (Other)  Medication Sig   Azelaic Acid  15 % gel APPLY TOPICALLY TO AFFECTED AREA TWICE DAILY IN THE MORNING AND EVENING AFTER SKIN HAS BEEN THOROUGHLY WASHED AND PATTED DRY   B Complex-C-Folic Acid (HM VITAMIN B COMPLEX/VITAMIN C PO) Take 1 tablet by mouth daily. (Patient not taking: Reported on 01/09/2024)   clonazePAM  (KLONOPIN ) 0.5 MG tablet Take 0.5-1 tablets (0.25-0.5 mg total) by mouth 2  (two) times daily as needed for anxiety.   FLUoxetine  (PROZAC ) 10 MG tablet Take 1 tablet (10 mg total) by mouth daily.   MAGNESIUM GLYCINATE PO Take 1 tablet by mouth at bedtime.   Multiple Vitamin (MULTIVITAMIN) tablet Take 1 tablet by mouth daily.   Probiotic Product (FORTIFY PROBIOTIC WOMENS EX ST PO) Take 1 tablet by mouth daily.   spironolactone  (ALDACTONE ) 50 MG tablet Take 1 tablet by mouth once daily   tretinoin  (RETIN-A ) 0.025 % cream Apply topically at bedtime.   vitamin E 180 MG (400 UNITS) capsule Take 400 Units by mouth 2 (two) times daily.   No current facility-administered medications for this visit. (Other)   REVIEW OF SYSTEMS: ROS   Positive for: Neurological, Skin, Eyes Negative for: Constitutional, Gastrointestinal, Genitourinary, Musculoskeletal, HENT, Endocrine, Cardiovascular, Respiratory, Psychiatric, Allergic/Imm, Heme/Lymph Last edited by Elnor Avelina RAMAN, COT on 03/11/2024  8:03 AM.       ALLERGIES Allergies  Allergen Reactions   Stadol [Butorphanol]     Hallucinations    Sulfa  Antibiotics     Other reaction(s): gi distress   PAST MEDICAL HISTORY Past Medical History:  Diagnosis Date   Abnormal Pap smear of cervix    Acne    On Aldactone    Anemia    Anxiety with depression 12/18/2017   Cancer (HCC)    Constipation    H/O ovarian cancer  High cholesterol    Hot flashes    HPV (human papilloma virus) anogenital infection    Other hyperlipidemia 07/03/2022   Ovarian cyst    PONV (postoperative nausea and vomiting)    Primary hyperparathyroidism (HCC)    Past Surgical History:  Procedure Laterality Date   BREAST BIOPSY Right    CATARACT EXTRACTION Left 02/27/2024   Dr. Fleeta   CESAREAN SECTION     x3   EYE SURGERY Right    office procedure of the right eye   GAS INSERTION Left 05/16/2023   Procedure: INSERTION OF GAS;  Surgeon: Valdemar Rogue, MD;  Location: [facility] OR;  Service: Ophthalmology;  Laterality: Left;   LAPAROSCOPIC HYSTERECTOMY      total   LAPAROTOMY     Ovarian mass removed  12/06/2011   Borderline Ovarian Cancer   PARS PLANA VITRECTOMY Left 05/16/2023   Procedure: PARS PLANA VITRECTOMY WITH 25 GAUGE;  Surgeon: Valdemar Rogue, MD;  Location: [facility] OR;  Service: Ophthalmology;  Laterality: Left;   PARS PLANA VITRECTOMY 27 GAUGE WITH 25 GAUGE PORT Left 11/01/2023   Procedure: PARS PLANA VITRECTOMY FOR MACULAR HOLE WITH LASER AND INTRAOCULAR TAMPONADE; INSERTION OF GAS; MEMBRANE PEEL;  Surgeon: Valdemar Rogue, MD;  Location: [facility] OR;  Service: Ophthalmology;  Laterality: Left;   PHOTOCOAGULATION WITH LASER Left 05/16/2023   Procedure: PHOTOCOAGULATION WITH LASER;  Surgeon: Valdemar Rogue, MD;  Location[facility] OR;  Service: Ophthalmology;  Laterality: Left;   SMALL INTESTINE SURGERY     YAG LASER APPLICATION Left 03/05/2024   Dr. Fleeta   FAMILY HISTORY Family History  Problem Relation Age of Onset   Hypertension Mother    Diabetes Mother    High blood pressure Mother    Depression Mother    Drug abuse Mother    Obesity Mother    Hypertension Father    Diabetes Father    High Cholesterol Father    Heart disease Father    Cancer Father    Drug abuse Father    Hypertension Maternal Grandmother    Diabetes Maternal Grandmother    Hypertension Maternal Grandfather    Diabetes Maternal Grandfather    Breast cancer Paternal Grandmother    Cancer Paternal Grandmother        breast   Hypertension Paternal Grandmother    Diabetes Paternal Grandmother    Hypertension Paternal Grandfather    Diabetes Paternal Grandfather    Rectal cancer Other    Stomach cancer Other    Esophageal cancer Other    Colon polyps Other    Colon cancer Other    SOCIAL HISTORY Social History   Tobacco Use   Smoking status: Never   Smokeless tobacco: Never  Vaping Use   Vaping status: Never Used  Substance Use Topics   Alcohol use: Yes    Comment: cocktails 3 x week   Drug use: No       OPHTHALMIC EXAM:  Base Eye Exam      Visual Acuity (Snellen - Linear)       Right Left   Dist [place]  20/40 -2   Dist cc 20/20    Dist ph [place]  20/30    Correction: Glasses         Tonometry (Tonopen, 7:55 AM)       Right Left   Pressure 20 16         Pupils       Pupils Dark Light Shape React APD   Right PERRL 3  2 Round Brisk None   Left PERRL 5 5 Round None NR         Visual Fields       Left Right    Full Full         Extraocular Movement       Right Left    Full, Ortho Full, Ortho         Neuro/Psych     Oriented x3: Yes   Mood/Affect: Normal         Dilation     Both eyes: 1.0% Mydriacyl , 2.5% Phenylephrine  @ 7:56 AM           Slit Lamp and Fundus Exam     Slit Lamp Exam       Right Left   Lids/Lashes Normal Normal   Conjunctiva/Sclera mild melanosis mild melanosis, sutures dissolving   Cornea arcus, trace tear film debris trace PEE, trace tear film debris, well healed cataract wound, superior and inferior LRI's   Anterior Chamber deep, clear, narrow temporal angle Deep, 1+fine cell and pigment   Iris Round and reactive Round and dilated   Lens 1+ Cortical cataract PC IOL in good position with open PC   Anterior Vitreous mild syneresis post vitrectomy, +fine pigment         Fundus Exam       Right Left   Disc Pink and Sharp, mild PPP mild PPP, mild Pallor, Sharp rim   C/D Ratio 0.6 0.5   Macula Flat, Good foveal reflex, RPE mottling, No heme or edema Flat, good foveal reflex, mac hole closed, No heme or edema, focal CR atrophy ST mac   Vessels attenuated, mild tortuosity attenuated, mild tortuosity   Periphery Attached, patches of lattice degeneration at 1100 and from 0430-0600, smaller patches of lattice at 0300 and 0730 equator -- good laser changes surrounding all lesions, no new RT/RD or lattice Attached, good laser surrounding large tears SN quad at 1000 and 1100, good 360 peripheral laser, focal fibrosis; PRE-OP: 2 large tears SN quad at 1000 and 1100 with +SRF;  no other details visible elsewhere           Refraction     Wearing Rx       Sphere Cylinder Axis Add   Right -6.50 +4.75 094 +2.00   Left               IMAGING AND PROCEDURES  Imaging and Procedures for 03/11/2024  OCT, Retina - OU - Both Eyes       Right Eye Quality was good. Central Foveal Thickness: 275. Progression has been stable. Findings include normal foveal contour, no IRF, no SRF, vitreomacular adhesion .   Left Eye Quality was good. Central Foveal Thickness: 246. Progression has been stable. Findings include normal foveal contour, no IRF, no SRF, outer retinal atrophy (Mac hole closed, interval improvement in ellipsoid signal, focal atrophy ST mac).   Notes *Images captured and stored on drive  Diagnosis / Impression:  OD: NFP, no IRF/SRF OS: Mac hole closed, interval improvement in ellipsoid signal, focal atrophy ST mac  Clinical management:  See below  Abbreviations: NFP - Normal foveal profile. CME - cystoid macular edema. PED - pigment epithelial detachment. IRF - intraretinal fluid. SRF - subretinal fluid. EZ - ellipsoid zone. ERM - epiretinal membrane. ORA - outer retinal atrophy. ORT - outer retinal tubulation. SRHM - subretinal hyper-reflective material. IRHM - intraretinal hyper-reflective material  ASSESSMENT/PLAN:   ICD-10-CM   1. Full thickness macular hole of left eye  H35.342 OCT, Retina - OU - Both Eyes    2. Left retinal detachment  H33.22     3. Cystoid macular edema of left eye  H35.352     4. Vitreous hemorrhage of left eye (HCC)  H43.12     5. Lattice degeneration of right retina  H35.411     6. Retinal hole of right eye  H33.321     7. Cortical cataract of right eye  H26.9     8. Pseudophakia  Z96.1      Macular hole, left eye   - OCT showed new full-thickness macular hole OS on 03.24.25 visit - s/p PPV/TissueBlue /MP/14% C3F8 OS, 03.27.25             - doing well             - mac hole closed; retina  attached  - BCVA OS 20/30 from 20/150             - IOP improved to 16 today -- cont Cosopt  BID OS  - f/u 9-12 months, DFE, OCT   2-4. Rhegmatogenous retinal detachment with vitreous hemorrhage and CME OS  - s/p STK OS #1 (01.13.25)  - pt reports progressive vision loss OS w/ onset on Saturday, 10.05.24, green blobs in vision  - denies photopsias  - vision became increasing blurry and presented to ED on Sunday, 10.06.24  - outpt follow up with Dr. Vivian on 10.07.24 - exam 10.8.24 w/ diffuse VH (no view of posterior pole), two large retinal tears at 1000 and 1100 (superonasal periphery) with +SRF - B-scan 10.8.24 ultrasound shows focal peripheral detachment in superonasal quad with tears at 1000 and 1100, matching exam - s/p PPV/PFO/EL/FAX/14% C3F8 OS, 10.09.2024             - retina attached and in good position -- good laser around breaks and 360  - IOP OS 16         5,6. Lattice degeneration w/ atrophic holes, right eye - patches of lattice at 11 and from 0430-0600, smaller patches of lattice at 0300 and 0730 equator - s/p retinopexy OD (10.08.24) -- good laser surrounding all lesions - no new RT/RD or lattice - monitor  7. Cortical Cataract OD - The symptoms of cataract, surgical options, and treatments and risks were discussed with patient. - discussed diagnosis and progression - under the expert management of Dr. Fleeta  8. Pseudophakia OS  - s/p CE/IOL (Dr. Fleeta, 07.22.25)  - IOL in good position, doing well  - s/p YAG cap OS (08.04.25)  - monitor  Ophthalmic Meds Ordered this visit:  No orders of the defined types were placed in this encounter.    Return for f/u 9-12 months, FTMH OS, DFE, OCT.  There are no Patient Instructions on file for this visit.  This document serves as a record of services personally performed by Redell JUDITHANN Hans, MD, PhD. It was created on their behalf by Auston Muzzy, COMT. The creation of this record is the provider's dictation and/or  activities during the visit.  Electronically signed by: Auston Muzzy, COMT 03/11/24 11:19 PM  This document serves as a record of services personally performed by Redell JUDITHANN Hans, MD, PhD. It was created on their behalf by Alan PARAS. Delores, OA an ophthalmic technician. The creation of this record is the provider's dictation and/or activities during the visit.    Electronically  signed by: Alan PARAS. Delores, OA 03/11/24 11:19 PM  Redell JUDITHANN Hans, M.D., Ph.D. Diseases & Surgery of the Retina and Vitreous [facility]  I have reviewed the above documentation for accuracy and completeness, and I agree with the above. Redell JUDITHANN Hans, M.D., Ph.D. 03/11/24 11:21 PM   Abbreviations: M myopia (nearsighted); A astigmatism; H hyperopia (farsighted); P presbyopia; Mrx spectacle prescription;  CTL contact lenses; OD right eye; OS left eye; OU both eyes  XT exotropia; ET esotropia; PEK punctate epithelial keratitis; PEE punctate epithelial erosions; DES dry eye syndrome; MGD meibomian gland dysfunction; ATs artificial tears; PFAT's preservative free artificial tears; NSC nuclear sclerotic cataract; PSC posterior subcapsular cataract; ERM epi-retinal membrane; PVD posterior vitreous detachment; RD retinal detachment; DM diabetes mellitus; DR diabetic retinopathy; NPDR non-proliferative diabetic retinopathy; PDR proliferative diabetic retinopathy; CSME clinically significant macular edema; DME diabetic macular edema; dbh dot blot hemorrhages; CWS cotton wool spot; POAG primary open angle glaucoma; C/D cup-to-disc ratio; HVF humphrey visual field; GVF goldmann visual field; OCT optical coherence tomography; IOP intraocular pressure; BRVO Branch retinal vein occlusion; CRVO central retinal vein occlusion; CRAO central retinal artery occlusion; BRAO branch retinal artery occlusion; RT retinal tear; SB scleral buckle; PPV pars plana vitrectomy; VH Vitreous hemorrhage; PRP panretinal laser  photocoagulation; IVK intravitreal kenalog ; VMT vitreomacular traction; MH Macular hole;  NVD neovascularization of the disc; NVE neovascularization elsewhere; AREDS age related eye disease study; ARMD age related macular degeneration; POAG primary open angle glaucoma; EBMD epithelial/anterior basement membrane dystrophy; ACIOL anterior chamber intraocular lens; IOL intraocular lens; PCIOL posterior chamber intraocular lens; Phaco/IOL phacoemulsification with intraocular lens placement; PRK photorefractive keratectomy; LASIK laser assisted in situ keratomileusis; HTN hypertension; DM diabetes mellitus; COPD chronic obstructive pulmonary disease

## 2024-03-10 DIAGNOSIS — H26492 Other secondary cataract, left eye: Secondary | ICD-10-CM | POA: Diagnosis not present

## 2024-03-11 ENCOUNTER — Encounter (INDEPENDENT_AMBULATORY_CARE_PROVIDER_SITE_OTHER): Payer: Self-pay | Admitting: Ophthalmology

## 2024-03-11 ENCOUNTER — Ambulatory Visit (INDEPENDENT_AMBULATORY_CARE_PROVIDER_SITE_OTHER): Admitting: Ophthalmology

## 2024-03-11 DIAGNOSIS — H35342 Macular cyst, hole, or pseudohole, left eye: Secondary | ICD-10-CM

## 2024-03-11 DIAGNOSIS — H25812 Combined forms of age-related cataract, left eye: Secondary | ICD-10-CM

## 2024-03-11 DIAGNOSIS — H33321 Round hole, right eye: Secondary | ICD-10-CM

## 2024-03-11 DIAGNOSIS — Z961 Presence of intraocular lens: Secondary | ICD-10-CM

## 2024-03-11 DIAGNOSIS — H269 Unspecified cataract: Secondary | ICD-10-CM

## 2024-03-11 DIAGNOSIS — H3322 Serous retinal detachment, left eye: Secondary | ICD-10-CM | POA: Diagnosis not present

## 2024-03-11 DIAGNOSIS — H4312 Vitreous hemorrhage, left eye: Secondary | ICD-10-CM | POA: Diagnosis not present

## 2024-03-11 DIAGNOSIS — H35352 Cystoid macular degeneration, left eye: Secondary | ICD-10-CM | POA: Diagnosis not present

## 2024-03-11 DIAGNOSIS — H35411 Lattice degeneration of retina, right eye: Secondary | ICD-10-CM

## 2024-03-24 ENCOUNTER — Encounter (INDEPENDENT_AMBULATORY_CARE_PROVIDER_SITE_OTHER): Payer: Self-pay | Admitting: Ophthalmology

## 2024-04-14 ENCOUNTER — Ambulatory Visit: Payer: Self-pay | Admitting: Family Medicine

## 2024-04-14 ENCOUNTER — Encounter: Payer: Self-pay | Admitting: Family Medicine

## 2024-04-14 ENCOUNTER — Ambulatory Visit (INDEPENDENT_AMBULATORY_CARE_PROVIDER_SITE_OTHER): Admitting: Family Medicine

## 2024-04-14 ENCOUNTER — Telehealth: Payer: Self-pay

## 2024-04-14 VITALS — BP 105/68 | HR 89 | Temp 98.0°F | Resp 16 | Ht 64.0 in | Wt 178.4 lb

## 2024-04-14 DIAGNOSIS — Z0000 Encounter for general adult medical examination without abnormal findings: Secondary | ICD-10-CM

## 2024-04-14 DIAGNOSIS — F418 Other specified anxiety disorders: Secondary | ICD-10-CM

## 2024-04-14 LAB — CBC
HCT: 46.1 % — ABNORMAL HIGH (ref 36.0–46.0)
Hemoglobin: 15 g/dL (ref 12.0–15.0)
MCHC: 32.5 g/dL (ref 30.0–36.0)
MCV: 89.6 fl (ref 78.0–100.0)
Platelets: 270 K/uL (ref 150.0–400.0)
RBC: 5.15 Mil/uL — ABNORMAL HIGH (ref 3.87–5.11)
RDW: 13.9 % (ref 11.5–15.5)
WBC: 4.8 K/uL (ref 4.0–10.5)

## 2024-04-14 LAB — COMPREHENSIVE METABOLIC PANEL WITH GFR
ALT: 24 U/L (ref 0–35)
AST: 22 U/L (ref 0–37)
Albumin: 4.5 g/dL (ref 3.5–5.2)
Alkaline Phosphatase: 82 U/L (ref 39–117)
BUN: 16 mg/dL (ref 6–23)
CO2: 30 meq/L (ref 19–32)
Calcium: 11.6 mg/dL — ABNORMAL HIGH (ref 8.4–10.5)
Chloride: 102 meq/L (ref 96–112)
Creatinine, Ser: 0.82 mg/dL (ref 0.40–1.20)
GFR: 83.82 mL/min (ref >60.00)
Glucose, Bld: 86 mg/dL (ref 70–99)
Potassium: 4.5 meq/L (ref 3.5–5.1)
Sodium: 139 meq/L (ref 135–145)
Total Bilirubin: 0.6 mg/dL (ref 0.2–1.2)
Total Protein: 7.9 g/dL (ref 6.0–8.3)

## 2024-04-14 LAB — LIPID PANEL
Cholesterol: 297 mg/dL — ABNORMAL HIGH (ref 0–200)
HDL: 82.9 mg/dL (ref >39.00)
LDL Cholesterol: 196 mg/dL — ABNORMAL HIGH (ref 0–99)
NonHDL: 213.83
Total CHOL/HDL Ratio: 4
Triglycerides: 90 mg/dL (ref 0.0–149.0)
VLDL: 18 mg/dL (ref 0.0–40.0)

## 2024-04-14 MED ORDER — CLONAZEPAM 0.5 MG PO TABS
0.2500 mg | ORAL_TABLET | Freq: Two times a day (BID) | ORAL | 1 refills | Status: AC | PRN
Start: 2024-04-14 — End: ?

## 2024-04-14 NOTE — Telephone Encounter (Signed)
 Pharmacy notified.

## 2024-04-14 NOTE — Telephone Encounter (Signed)
 Copied from CRM [id number]. Topic: Clinical - Medication Question >> Apr 14, 2024  9:39 AM Deaijah H wrote: Reason for CRM: Dorothyann w/ [facility] called in stating clonazePAM  (KLONOPIN ) 0.5 MG tablet established the diagnosis but would like for someone to give a call back for documentation for controlled substance. Please call [phone]

## 2024-04-14 NOTE — Progress Notes (Signed)
 Chief Complaint  Patient presents with   Annual Exam    CPE     Well Woman Kimberly Richards is here for a complete physical.   Her last physical was >1 year ago.  Current diet: in general, a healthy diet. Current exercise: getting back into things after eye issues. Weight is stable and she denies fatigue out of ordinary. No LMP recorded. Patient has had a hysterectomy. Seatbelt? Yes Advanced directive? No- has the form  Health Maintenance Pap/HPV- No cervix Mammogram- Yes Tetanus- Yes Hep C screening- Yes HIV screening- Yes  Past Medical History:  Diagnosis Date   Abnormal Pap smear of cervix    Acne    On Aldactone    Anemia    Anxiety with depression 12/18/2017   Cancer (HCC)    Constipation    H/O ovarian cancer    High cholesterol    Hot flashes    HPV (human papilloma virus) anogenital infection    Other hyperlipidemia 07/03/2022   Ovarian cyst    PONV (postoperative nausea and vomiting)    Primary hyperparathyroidism (HCC)      Past Surgical History:  Procedure Laterality Date   BREAST BIOPSY Right    CATARACT EXTRACTION Left 02/27/2024   Dr. Fleeta   CESAREAN SECTION     x3   EYE SURGERY Right    office procedure of the right eye   GAS INSERTION Left 05/16/2023   Procedure: INSERTION OF GAS;  Surgeon: Valdemar Rogue, MD;  Location: [facility] OR;  Service: Ophthalmology;  Laterality: Left;   LAPAROSCOPIC HYSTERECTOMY     total   LAPAROTOMY     Ovarian mass removed  12/06/2011   Borderline Ovarian Cancer   PARS PLANA VITRECTOMY Left 05/16/2023   Procedure: PARS PLANA VITRECTOMY WITH 25 GAUGE;  Surgeon: Valdemar Rogue, MD;  Location: [facility] OR;  Service: Ophthalmology;  Laterality: Left;   PARS PLANA VITRECTOMY 27 GAUGE WITH 25 GAUGE PORT Left 11/01/2023   Procedure: PARS PLANA VITRECTOMY FOR MACULAR HOLE WITH LASER AND INTRAOCULAR TAMPONADE; INSERTION OF GAS; MEMBRANE PEEL;  Surgeon: Valdemar Rogue, MD;  Location: [facility] OR;  Service: Ophthalmology;  Laterality: Left;    PHOTOCOAGULATION WITH LASER Left 05/16/2023   Procedure: PHOTOCOAGULATION WITH LASER;  Surgeon: Valdemar Rogue, MD;  Location: [facility] OR;  Service: Ophthalmology;  Laterality: Left;   SMALL INTESTINE SURGERY     YAG LASER APPLICATION Left 03/05/2024   Dr. Fleeta    Medications  Current Outpatient Medications on File Prior to Visit  Medication Sig Dispense Refill   Azelaic Acid  15 % gel APPLY TOPICALLY TO AFFECTED AREA TWICE DAILY IN THE MORNING AND EVENING AFTER SKIN HAS BEEN THOROUGHLY WASHED AND PATTED DRY 50 g 0   FLUoxetine  (PROZAC ) 10 MG tablet Take 1 tablet (10 mg total) by mouth daily. 90 tablet 3   MAGNESIUM GLYCINATE PO Take 1 tablet by mouth at bedtime.     Multiple Vitamin (MULTIVITAMIN) tablet Take 1 tablet by mouth daily.     Probiotic Product (FORTIFY PROBIOTIC WOMENS EX ST PO) Take 1 tablet by mouth daily.     spironolactone  (ALDACTONE ) 50 MG tablet Take 1 tablet by mouth once daily 270 tablet 1   tretinoin  (RETIN-A ) 0.025 % cream Apply topically at bedtime. 45 g 2   vitamin E 180 MG (400 UNITS) capsule Take 400 Units by mouth 2 (two) times daily.     No current facility-administered medications on file prior to visit.     Allergies Allergies  Allergen Reactions   Stadol [Butorphanol]     Hallucinations    Sulfa  Antibiotics     Other reaction(s): gi distress    Review of Systems: Constitutional:  no unexpected weight changes Eye:  no recent significant change in vision Ear/Nose/Mouth/Throat:  Ears:  no recent change in hearing Nose/Mouth/Throat:  no complaints of nasal congestion, no sore throat Cardiovascular: no chest pain Respiratory:  no shortness of breath Gastrointestinal:  no abdominal pain, no change in bowel habits GU:  Female: negative for dysuria or pelvic pain Musculoskeletal/Extremities:  no pain of the joints Integumentary (Skin/Breast):  no abnormal skin lesions reported Neurologic:  no headaches Endocrine:  denies fatigue Hematologic/Lymphatic:   No areas of easy bleeding  Exam BP 105/68 (BP Location: Left Arm, Patient Position: Sitting)   Pulse 89   Temp 98 F (36.7 C) (Oral)   Resp 16   Ht 5' 4 (1.626 m)   Wt 178 lb 6.4 oz (80.9 kg)   SpO2 98%   BMI 30.62 kg/m  General:  well developed, well nourished, in no apparent distress Skin:  no significant moles, warts, or growths Head:  no masses, lesions, or tenderness Eyes:  pupils equal and round, sclera anicteric without injection Ears:  canals without lesions, TMs shiny without retraction, no obvious effusion, no erythema Nose:  nares patent, mucosa normal, and no drainage Throat/Pharynx:  lips and gingiva without lesion; tongue and uvula midline; non-inflamed pharynx; no exudates or postnasal drainage Neck: neck supple without adenopathy, thyromegaly, or masses Lungs:  clear to auscultation, breath sounds equal bilaterally, no respiratory distress Cardio:  regular rate and rhythm, no LE edema Abdomen:  abdomen soft, nontender; bowel sounds normal; no masses or organomegaly Genital: Defer to GYN Musculoskeletal:  symmetrical muscle groups noted without atrophy or deformity Extremities:  no clubbing, cyanosis, or edema, no deformities, no skin discoloration Neuro:  gait normal; deep tendon reflexes normal and symmetric Psych: well oriented with normal range of affect and appropriate judgment/insight  Assessment and Plan  Well adult exam - Plan: CBC, Comprehensive metabolic panel with GFR, Lipid panel, Hepatitis B surface antibody,quantitative  Anxiety with depression - Plan: clonazePAM  (KLONOPIN ) 0.5 MG tablet   Well 48 y.o. female. Counseled on diet and exercise. Advanced directive form provided today.  Screen Hep B. She gets flu shots in mid Oct.  Other orders as above. Follow up in 6 mo. The patient voiced understanding and agreement to the plan.  Kimberly [place], DO 04/14/24 8:06 AM

## 2024-04-14 NOTE — Patient Instructions (Signed)
 Give [place] 2-3 business days to get the results of your labs back.   Keep the diet clean and stay active.  Please get me a copy of your advanced directive form at your convenience.   Let us know if you need anything.

## 2024-04-15 ENCOUNTER — Other Ambulatory Visit: Payer: Self-pay

## 2024-04-15 DIAGNOSIS — E7800 Pure hypercholesterolemia, unspecified: Secondary | ICD-10-CM

## 2024-04-15 LAB — HEPATITIS B SURFACE ANTIBODY, QUANTITATIVE: Hep B S AB Quant (Post): >1000 m[IU]/mL (ref >=10)

## 2024-04-21 ENCOUNTER — Ambulatory Visit (INDEPENDENT_AMBULATORY_CARE_PROVIDER_SITE_OTHER): Admitting: Family Medicine

## 2024-04-21 ENCOUNTER — Encounter: Payer: Self-pay | Admitting: Family Medicine

## 2024-04-21 VITALS — BP 122/80 | HR 100 | Temp 98.0°F | Resp 16 | Ht 64.0 in | Wt 177.2 lb

## 2024-04-21 DIAGNOSIS — L0212 Furuncle of neck: Secondary | ICD-10-CM | POA: Diagnosis not present

## 2024-04-21 MED ORDER — FLUCONAZOLE 150 MG PO TABS: ORAL_TABLET | ORAL | 0 refills | Status: AC

## 2024-04-21 MED ORDER — TRAMADOL HCL 50 MG PO TABS: 50.0000 mg | ORAL_TABLET | Freq: Three times a day (TID) | ORAL | 0 refills | Status: AC | PRN

## 2024-04-21 MED ORDER — DOXYCYCLINE HYCLATE 100 MG PO TABS: 100.0000 mg | ORAL_TABLET | Freq: Two times a day (BID) | ORAL | 0 refills | Status: AC

## 2024-04-21 NOTE — Patient Instructions (Addendum)
 Ice/cold pack over area for 10-15 min twice daily.  OK to take Tylenol  1000 mg (2 extra strength tabs) or 975 mg (3 regular strength tabs) every 6 hours as needed.  Ibuprofen 400-600 mg (2-3 over the counter strength tabs) every 6 hours as needed for pain.  When you do wash it, use only soap and water . Do not vigorously scrub. Apply triple antibiotic ointment (like Neosporin) twice daily. Keep the area clean and dry.   Things to look out for: increasing pain not relieved by ibuprofen/acetaminophen , fevers, spreading redness, drainage of pus, or foul odor.  Do not drink alcohol, do any illicit/street drugs, drive or do anything that requires alertness while on this medicine.   Let us  know if you need anything.

## 2024-04-21 NOTE — Progress Notes (Signed)
 Chief Complaint  Patient presents with   Cyst    Cyst    Kimberly Richards is a 48 y.o. female here for a skin complaint.  Duration: 4 days Location: Back of neck Pruritic? No Painful? Yes Drainage? No New soaps/lotions/topicals/detergents? No Trauma? No Other associated symptoms: redness, slightly growing. No fevers. Therapies tried thus far: Voltaren gel  Past Medical History:  Diagnosis Date   Abnormal Pap smear of cervix    Acne    On Aldactone    Anemia    Anxiety with depression 12/18/2017   Cancer (HCC)    Constipation    H/O ovarian cancer    High cholesterol    Hot flashes    HPV (human papilloma virus) anogenital infection    Other hyperlipidemia 07/03/2022   Ovarian cyst    PONV (postoperative nausea and vomiting)    Primary hyperparathyroidism (HCC)     BP 122/80 (BP Location: Left Arm, Patient Position: Sitting)   Pulse 100   Temp 98 F (36.7 C) (Oral)   Resp 16   Ht 5' 4 (1.626 m)   Wt 177 lb 3.2 oz (80.4 kg)   SpO2 98%   BMI 30.42 kg/m  Gen: awake, alert, appearing stated age Lungs: No accessory muscle use Skin: 1.9 x 0.7 cm elliptically shaped lesion over the posterior right neck.  It is indurated, erythematous, and TTP.  There is an overlying pustule that is not actively draining. No excoriation Psych: Age appropriate judgment and insight  Furuncle of neck - Plan: doxycycline  (VIBRA -TABS) 100 MG tablet, fluconazole  (DIFLUCAN ) 150 MG tablet, traMADol  (ULTRAM ) 50 MG tablet  7 days of doxycycline , fluconazole  as needed, tramadol  nightly as needed.  Warned about drowsiness associated with this.  Tylenol , ibuprofen, warm compresses, ice.  Warning signs and symptoms for tramadol . F/u prn. The patient voiced understanding and agreement to the plan.  Mabel [place], DO 04/21/24 11:12 AM

## 2024-05-26 ENCOUNTER — Other Ambulatory Visit (INDEPENDENT_AMBULATORY_CARE_PROVIDER_SITE_OTHER)

## 2024-05-26 ENCOUNTER — Ambulatory Visit: Payer: Self-pay | Admitting: Family Medicine

## 2024-05-26 DIAGNOSIS — E7800 Pure hypercholesterolemia, unspecified: Secondary | ICD-10-CM

## 2024-05-26 LAB — LIPID PANEL
Cholesterol: 228 mg/dL — ABNORMAL HIGH (ref 0–200)
HDL: 64.6 mg/dL (ref >39.00)
LDL Cholesterol: 145 mg/dL — ABNORMAL HIGH (ref 0–99)
NonHDL: 163.39
Total CHOL/HDL Ratio: 4
Triglycerides: 90 mg/dL (ref 0.0–149.0)
VLDL: 18 mg/dL (ref 0.0–40.0)

## 2024-05-29 ENCOUNTER — Ambulatory Visit: Admitting: Family Medicine

## 2024-05-29 VITALS — BP 105/73 | HR 69 | Ht 64.0 in | Wt 177.1 lb

## 2024-05-29 DIAGNOSIS — Z1331 Encounter for screening for depression: Secondary | ICD-10-CM | POA: Diagnosis not present

## 2024-05-29 DIAGNOSIS — Z01419 Encounter for gynecological examination (general) (routine) without abnormal findings: Secondary | ICD-10-CM | POA: Diagnosis not present

## 2024-05-29 DIAGNOSIS — Z23 Encounter for immunization: Secondary | ICD-10-CM | POA: Diagnosis not present

## 2024-05-29 DIAGNOSIS — Z8543 Personal history of malignant neoplasm of ovary: Secondary | ICD-10-CM | POA: Diagnosis not present

## 2024-05-29 NOTE — Progress Notes (Signed)
 Patient presents for Annual.  LMP: No LMP recorded. Patient has had a hysterectomy.  Last pap: hysterectomy 3 years   Contraception: None Mammogram: Up to date: 01/10/2024 STD Screening: Declines Flu Vaccine : Accepts  CC:   Fun Fact:

## 2024-05-29 NOTE — Progress Notes (Signed)
 ANNUAL EXAM Patient name: Kimberly Richards MRN [id number]  Date of birth: 06-22-1976 Chief Complaint:   Annual Exam  History of Present Illness:   Kimberly Richards is a 48 y.o.  [id number]  female  being seen today for a routine annual exam.  Current complaints: some hot flashes and night sweats, but improving. Had hysterectomy for ovarian cancer. It's been about 4 years since getting a CA-125. No problems.  No LMP recorded. Patient has had a hysterectomy.    Last mammogram: 01/2024. Results were: normal.  Last colonoscopy: 2022.      05/29/2024   10:25 AM 04/21/2024   10:58 AM 04/14/2024    7:32 AM 01/09/2024    1:10 PM 10/09/2023    9:46 AM  Depression screen PHQ 2/9  Decreased Interest 0 0 0 1 0  Down, Depressed, Hopeless 1 0 1 1 0  PHQ - 2 Score 1 0 1 2 0  Altered sleeping 1 1 1  0 0  Tired, decreased energy 0 1 0 0 0  Change in appetite 0 0 0 0 0  Feeling bad or failure about yourself  0 0 0 0 0  Trouble concentrating 1 0 0 0 0  Moving slowly or fidgety/restless 0 0 0 0 0  Suicidal thoughts 0 0 0 0 0  PHQ-9 Score 3 2 2 2  0  Difficult doing work/chores  Somewhat difficult Not difficult at all  Not difficult at all        05/29/2024   10:26 AM 04/21/2024   10:59 AM 04/14/2024    7:32 AM 01/09/2024    1:10 PM  GAD 7 : Generalized Anxiety Score  Nervous, Anxious, on Edge 2 1 2 1   Control/stop worrying 1 1 1 1   Worry too much - different things 1 1 1  0  Trouble relaxing 1 1 1  0  Restless 0 0 0 0  Easily annoyed or irritable 1 0 1 1  Afraid - awful might happen 0 0 0 0  Total GAD 7 Score 6 4 6 3   Anxiety Difficulty  Not difficult at all Not difficult at all      Review of Systems:   Pertinent items are noted in HPI Denies any headaches, blurred vision, fatigue, shortness of breath, chest pain, abdominal pain, abnormal vaginal discharge/itching/odor/irritation, problems with periods, bowel movements, urination, or intercourse unless otherwise stated above. Pertinent  History Reviewed:  Reviewed past medical,surgical, social and family history.  Reviewed problem list, medications and allergies. Physical Assessment:   Vitals:   05/29/24 1024  BP: 105/73  Pulse: 69  Weight: 177 lb 1.9 oz (80.3 kg)  Height: 5' 4 (1.626 m)  Body mass index is 30.4 kg/m.        Physical Examination:   General appearance - well appearing, and in no distress  Mental status - alert, oriented to person, place, and time  Psych:  She has a normal mood and affect  Skin - warm and dry, normal color, no suspicious lesions noted  Chest - effort normal, all lung fields clear to auscultation bilaterally  Heart - normal rate and regular rhythm  Neck:  midline trachea, no thyromegaly or nodules  Breasts - breasts appear normal, no suspicious masses, no skin or nipple changes or axillary nodes  Abdomen - soft, nontender, nondistended, no masses or organomegaly  Pelvic - VULVA: normal appearing vulva with no masses, tenderness or lesions  VAGINA: normal appearing vagina with normal color and discharge, no lesions  CERVIX: normal appearing cervix without discharge or lesions, no CMT  Thin prep pap is not done   UTERUS: surgically absent  Extremities:  No swelling or varicosities noted  Chaperone present for exam  Assessment & Plan:  1. Encounter for well woman exam with routine gynecological exam (Primary) No indication for PAP  2. Need for influenza vaccination - Flu vaccine trivalent PF, 6mos and older(Flulaval,Afluria,Fluarix,Fluzone)  3. History of ovarian cancer in adulthood CA-125 annually. Exam benign.  Labs/procedures today:   Orders Placed This Encounter  Procedures   Flu vaccine trivalent PF, 6mos and older(Flulaval,Afluria,Fluarix,Fluzone)    Meds: No orders of the defined types were placed in this encounter.   Follow-up: No follow-ups on file.  Farrell Pantaleo J Marisue Canion, DO 05/29/2024 11:00 AM

## 2024-05-30 ENCOUNTER — Ambulatory Visit: Payer: Self-pay | Admitting: Family Medicine

## 2024-05-30 LAB — CA 125: Cancer Antigen (CA) 125: 7.6 U/mL (ref 0.0–38.1)

## 2024-06-05 DIAGNOSIS — Z961 Presence of intraocular lens: Secondary | ICD-10-CM | POA: Diagnosis not present

## 2024-06-18 ENCOUNTER — Ambulatory Visit (INDEPENDENT_AMBULATORY_CARE_PROVIDER_SITE_OTHER): Admitting: Ophthalmology

## 2024-06-18 ENCOUNTER — Encounter (INDEPENDENT_AMBULATORY_CARE_PROVIDER_SITE_OTHER): Payer: Self-pay | Admitting: Ophthalmology

## 2024-06-18 DIAGNOSIS — H35411 Lattice degeneration of retina, right eye: Secondary | ICD-10-CM

## 2024-06-18 DIAGNOSIS — H33311 Horseshoe tear of retina without detachment, right eye: Secondary | ICD-10-CM | POA: Diagnosis not present

## 2024-06-18 DIAGNOSIS — H35342 Macular cyst, hole, or pseudohole, left eye: Secondary | ICD-10-CM

## 2024-06-18 DIAGNOSIS — H4311 Vitreous hemorrhage, right eye: Secondary | ICD-10-CM

## 2024-06-18 DIAGNOSIS — H33321 Round hole, right eye: Secondary | ICD-10-CM | POA: Diagnosis not present

## 2024-06-18 DIAGNOSIS — H4313 Vitreous hemorrhage, bilateral: Secondary | ICD-10-CM

## 2024-06-18 DIAGNOSIS — H3322 Serous retinal detachment, left eye: Secondary | ICD-10-CM

## 2024-06-18 DIAGNOSIS — H269 Unspecified cataract: Secondary | ICD-10-CM

## 2024-06-18 DIAGNOSIS — H35352 Cystoid macular degeneration, left eye: Secondary | ICD-10-CM | POA: Diagnosis not present

## 2024-06-18 DIAGNOSIS — H43811 Vitreous degeneration, right eye: Secondary | ICD-10-CM

## 2024-06-18 DIAGNOSIS — H4312 Vitreous hemorrhage, left eye: Secondary | ICD-10-CM

## 2024-06-18 DIAGNOSIS — Z961 Presence of intraocular lens: Secondary | ICD-10-CM

## 2024-06-18 MED ORDER — PREDNISOLONE ACETATE 1 % OP SUSP: 1.0000 [drp] | Freq: Four times a day (QID) | OPHTHALMIC | 0 refills | Status: AC

## 2024-06-18 NOTE — Progress Notes (Signed)
 [facility] - Clinic Note  06/18/2024   CHIEF COMPLAINT Patient presents for Retina Follow Up  HISTORY OF PRESENT ILLNESS: Kimberly Richards is a 48 y.o. female who presents to the clinic today for:  HPI     Retina Follow Up   In right eye.  This started 1 day ago.  Duration of 1 day.  Since onset it is gradually worsening.  I, the attending physician,  performed the HPI with the patient and updated documentation appropriately.        Comments   Retina follow up floater pt states yesterday she started seeing clear floater in her right eye increased to a few more this morning she woke up noticed wavy lines and it looked like snow she denies any flashes of light       Last edited by Valdemar Rogue, MD on 06/18/2024  3:50 PM.     Pt states she began seeing a new floater last night, no FOL. Woke up this am and floater was more prominent and vision was hazy/snowy.   Referring physician: Frazier, Chad, OD [address] [place],  [place] [address]  HISTORICAL INFORMATION:  Selected notes from the MEDICAL RECORD NUMBER Referred by Dr. Vivian for concern of RD OS LEE:  Ocular Hx- PMH-   CURRENT MEDICATIONS: Current Outpatient Medications (Ophthalmic Drugs)  Medication Sig   prednisoLONE  acetate (PRED FORTE ) 1 % ophthalmic suspension Place 1 drop into the right eye 4 (four) times daily for 7 days.   No current facility-administered medications for this visit. (Ophthalmic Drugs)   Current Outpatient Medications (Other)  Medication Sig   Azelaic Acid  15 % gel APPLY TOPICALLY TO AFFECTED AREA TWICE DAILY IN THE MORNING AND EVENING AFTER SKIN HAS BEEN THOROUGHLY WASHED AND PATTED DRY   clonazePAM  (KLONOPIN ) 0.5 MG tablet Take 0.5-1 tablets (0.25-0.5 mg total) by mouth 2 (two) times daily as needed for anxiety.   fluconazole  (DIFLUCAN ) 150 MG tablet Take 1 tab, repeat in 72 hours if no improvement.   FLUoxetine  (PROZAC ) 10 MG tablet Take 1 tablet (10  mg total) by mouth daily.   MAGNESIUM GLYCINATE PO Take 1 tablet by mouth at bedtime.   Multiple Vitamin (MULTIVITAMIN) tablet Take 1 tablet by mouth daily.   Probiotic Product (FORTIFY PROBIOTIC WOMENS EX ST PO) Take 1 tablet by mouth daily.   spironolactone  (ALDACTONE ) 50 MG tablet Take 1 tablet by mouth once daily   tretinoin  (RETIN-A ) 0.025 % cream Apply topically at bedtime.   vitamin E 180 MG (400 UNITS) capsule Take 400 Units by mouth 2 (two) times daily.   No current facility-administered medications for this visit. (Other)   REVIEW OF SYSTEMS: ROS   Positive for: Neurological, Skin, Eyes Negative for: Constitutional, Gastrointestinal, Genitourinary, Musculoskeletal, HENT, Endocrine, Cardiovascular, Respiratory, Psychiatric, Allergic/Imm, Heme/Lymph Last edited by Resa Delon ORN, COT on 06/18/2024 10:17 AM.        ALLERGIES Allergies  Allergen Reactions   Stadol [Butorphanol]     Hallucinations    Sulfa  Antibiotics     Other reaction(s): gi distress   PAST MEDICAL HISTORY Past Medical History:  Diagnosis Date   Abnormal Pap smear of cervix    Acne    On Aldactone    Anemia    Anxiety with depression 12/18/2017   Cancer (HCC)    Constipation    H/O ovarian cancer    High cholesterol    Hot flashes    HPV (human papilloma virus) anogenital  infection    Other hyperlipidemia 07/03/2022   Ovarian cyst    PONV (postoperative nausea and vomiting)    Primary hyperparathyroidism    Past Surgical History:  Procedure Laterality Date   BREAST BIOPSY Right    CATARACT EXTRACTION Left 02/27/2024   Dr. Fleeta   CESAREAN SECTION     x3   EYE SURGERY Right    office procedure of the right eye   GAS INSERTION Left 05/16/2023   Procedure: INSERTION OF GAS;  Surgeon: Valdemar Rogue, MD;  [facility];  Service: Ophthalmology;  Laterality: Left;   LAPAROSCOPIC HYSTERECTOMY     total   LAPAROTOMY     Ovarian mass removed  12/06/2011   Borderline Ovarian Cancer    PARS PLANA VITRECTOMY Left 05/16/2023   Procedure: PARS PLANA VITRECTOMY WITH 25 GAUGE;  Surgeon: Valdemar Rogue, MD;  Location: [facility] OR;  Service: Ophthalmology;  Laterality: Left;   PARS PLANA VITRECTOMY 27 GAUGE WITH 25 GAUGE PORT Left 11/01/2023   Procedure: PARS PLANA VITRECTOMY FOR MACULAR HOLE WITH LASER AND INTRAOCULAR TAMPONADE; INSERTION OF GAS; MEMBRANE PEEL;  Surgeon: Valdemar Rogue, MD;  Location: [facility] OR;  Service: Ophthalmology;  Laterality: Left;   PHOTOCOAGULATION WITH LASER Left 05/16/2023   Procedure: PHOTOCOAGULATION WITH LASER;  Surgeon: Valdemar Rogue, MD;  Location: [facility] OR;  Service: Ophthalmology;  Laterality: Left;   SMALL INTESTINE SURGERY     YAG LASER APPLICATION Left 03/05/2024   Dr. Fleeta   FAMILY HISTORY Family History  Problem Relation Age of Onset   Hypertension Mother    Diabetes Mother    High blood pressure Mother    Depression Mother    Drug abuse Mother    Obesity Mother    Hypertension Father    Diabetes Father    High Cholesterol Father    Heart disease Father    Cancer Father    Drug abuse Father    Hypertension Maternal Grandmother    Diabetes Maternal Grandmother    Hypertension Maternal Grandfather    Diabetes Maternal Grandfather    Breast cancer Paternal Grandmother    Cancer Paternal Grandmother        breast   Hypertension Paternal Grandmother    Diabetes Paternal Grandmother    Hypertension Paternal Grandfather    Diabetes Paternal Grandfather    Rectal cancer Other    Stomach cancer Other    Esophageal cancer Other    Colon polyps Other    Colon cancer Other    SOCIAL HISTORY Social History   Tobacco Use   Smoking status: Never   Smokeless tobacco: Never  Vaping Use   Vaping status: Never Used  Substance Use Topics   Alcohol use: Yes    Comment: cocktails 3 x week   Drug use: No       OPHTHALMIC EXAM:  Base Eye Exam     Visual Acuity (Snellen - Linear)       Right Left   Dist cc 20/25 -3 20/40 -1   Dist ph cc NI  20/30         Tonometry (Tonopen, 10:22 AM)       Right Left   Pressure 18 16         Pupils       Pupils Dark Light Shape React APD   Right PERRL 3 2 Round Brisk None   Left PERRL 4 4 Round NR APD         Visual Fields  Left Right    Full Full         Extraocular Movement       Right Left    Full, Ortho Full, Ortho         Neuro/Psych     Oriented x3: Yes   Mood/Affect: Normal         Dilation     Both eyes: 2.5% Phenylephrine  @ 10:22 AM           Slit Lamp and Fundus Exam     Slit Lamp Exam       Right Left   Lids/Lashes Normal Normal   Conjunctiva/Sclera mild melanosis mild melanosis, sutures dissolved   Cornea arcus, trace tear film debris trace PEE, trace tear film debris, well healed cataract wound, superior and inferior LRI's   Anterior Chamber deep, clear, narrow temporal angle Deep, 1+fine cell and pigment   Iris Round and reactive Round and dilated   Lens 1+ Cortical cataract PC IOL in good position with open PC, fine pigment on optic   Anterior Vitreous mild syneresis, +pigment/RBC, Posterior vitreous detachment, mild blood stained vit condensations post vitrectomy, +fine pigment         Fundus Exam       Right Left   Disc Pink and Sharp, mild PPP mild PPP, mild Pallor, Sharp rim   C/D Ratio 0.6 0.5   Macula Flat, Good foveal reflex, RPE mottling, No heme or edema Flat, good foveal reflex, mac hole closed, No heme or edema, focal CR atrophy ST mac   Vessels attenuated, mild tortuosity attenuated, mild tortuosity   Periphery Attached, patches of lattice degeneration at 1100 and from 0430-0600, smaller patches of lattice at 0300 and 0730 equator -- good laser changes surrounding all lesions, new retinal hole at 1200, new HST at 200 w/ surrounding heme Attached, good laser surrounding large tears SN quad at 1000 and 1100, good 360 peripheral laser, focal fibrosis; PRE-OP: 2 large tears SN quad at 1000 and 1100 with +SRF; no  other details visible elsewhere           Refraction     Wearing Rx       Sphere Cylinder Axis Add   Right -6.50 +4.75 094 +2.00   Left               IMAGING AND PROCEDURES  Imaging and Procedures for 06/18/2024  OCT, Retina - OU - Both Eyes       Right Eye Quality was good. Central Foveal Thickness: 269. Progression has worsened. Findings include normal foveal contour, no IRF, no SRF (Interval release of VMA to full PVD and interval development of vit opacities ).   Left Eye Quality was good. Central Foveal Thickness: 255. Progression has been stable. Findings include normal foveal contour, no IRF, no SRF, outer retinal atrophy (Mac hole closed, stable improvement in ellipsoid signal, focal atrophy ST mac).   Notes *Images captured and stored on drive  Diagnosis / Impression:  OD: NFP, no IRF/SRF--Interval release of VMA to full PVD and interval development of vit opacities  OS: Mac hole closed, interval improvement in ellipsoid signal, focal atrophy ST mac  Clinical management:  See below  Abbreviations: NFP - Normal foveal profile. CME - cystoid macular edema. PED - pigment epithelial detachment. IRF - intraretinal fluid. SRF - subretinal fluid. EZ - ellipsoid zone. ERM - epiretinal membrane. ORA - outer retinal atrophy. ORT - outer retinal tubulation. SRHM - subretinal hyper-reflective material. IRHM - intraretinal  hyper-reflective material      Repair Retinal Breaks, Laser - OD - Right Eye       LASER PROCEDURE NOTE  Procedure:  Barrier laser retinopexy using slit lamp laser, RIGHT eye   Diagnosis:   Retinal breaks, RIGHT eye                     Retinal hole at 1200 and flap tear at 2 o'clock anterior to equator   Surgeon: Redell Hans, MD, PhD  Anesthesia: Topical, subconjunctival block  Informed consent obtained, operative eye marked, and time out performed prior to initiation of laser.   Laser settings:  Lumenis Smart532 laser, slit lamp Lens:  Mainster PRP 165 Power: 300 mW Spot size: 500 microns Duration: 30 msec  # spots: 809  Placement of laser: Using a Mainster PRP 165 contact lens at the slit lamp, laser was placed in three+ confluent rows around retinal hole at 1200 and around flap tear at 2 oclock anterior to equator  Complications: None.  Patient tolerated the procedure well and received written and verbal post-procedure care information/education.            ASSESSMENT/PLAN:   ICD-10-CM   1. Full thickness macular hole of left eye  H35.342 OCT, Retina - OU - Both Eyes    2. Left retinal detachment  H33.22     3. Cystoid macular edema of left eye  H35.352     4. Vitreous hemorrhage of left eye (HCC)  H43.12     5. Lattice degeneration of right retina  H35.411     6. Posterior vitreous detachment of right eye  H43.811     7. Retinal tear of right eye  H33.311 Repair Retinal Breaks, Laser - OD - Right Eye    8. Retinal hole of right eye  H33.321 Repair Retinal Breaks, Laser - OD - Right Eye    9. Vitreous hemorrhage of right eye (HCC)  H43.11     10. Cortical cataract of right eye  H26.9     11. Pseudophakia  Z96.1      **Pt presents acutely on 11.12.25 for new floater in OD/hazy/snowy vision OD, no FOL**  Macular hole, left eye   - OCT showed new full-thickness macular hole OS on 03.24.25 visit - s/p PPV/TissueBlue /MP/14% C3F8 OS, 03.27.25             - doing well             - mac hole closed; retina attached  - BCVA OS 20/40 from 20/30             - IOP 16 today    2-4. Rhegmatogenous retinal detachment with vitreous hemorrhage and CME OS  - s/p STK OS #1 (01.13.25)  - pt reports progressive vision loss OS w/ onset on Saturday, 10.05.24, green blobs in vision  - denies photopsias  - vision became increasing blurry and presented to ED on Sunday, 10.06.24  - outpt follow up with Dr. Vivian on 10.07.24 - exam 10.8.24 w/ diffuse VH (no view of posterior pole), two large retinal tears at 1000  and 1100 (superonasal periphery) with +SRF - B-scan 10.8.24 ultrasound shows focal peripheral detachment in superonasal quad with tears at 1000 and 1100, matching exam - s/p PPV/PFO/EL/FAX/14% C3F8 OS, 10.09.2024             - retina attached and in good position -- good laser around breaks and 360  - IOP OS 16  5. Lattice degeneration w/ atrophic holes OD - patches of lattice at 11 and from 0430-0600, smaller patches of lattice at 0300 and 0730 equator - s/p retinopexy OD (10.08.24) -- good laser surrounding all lesions - new retinal hole and RT OD w/ acute PVD on 11.12.25 -- see below - monitor  6-9. Acute PVD w/ retinal hole, retinal tear and VH OD  - Pt presented acutely on 11.12.25 for development of new floater and hazy vision OD, no FOL  - BCVA 20/25 down from 20/20  - OCT OD shows Interval release of VMA to full PVD and interval development of vit opacities   - exam today (11.12.25) shows +PVD, mild blood stained vit condensations, ret hole at 1200 and HST w/ surrounding heme at 0200  - discussed findings, prognosis and treatment options - recommend laser retinopexy OD today, 11.12.25 for new hole and HST OD - pt wishes to continue w/ laser retinopexy OD - RBA of procedure discussed, questions answered - informed consent obtained and signed 11.11.25 - see procedure note - Begin pred forte  QID OD x 7 days - f/u 1 week, DFE, OCT  - VH precautions reviewed -- minimize activities, keep head elevated, avoid ASA/NSAIDs/blood thinners as able   10. Cortical Cataract OD - The symptoms of cataract, surgical options, and treatments and risks were discussed with patient. - discussed diagnosis and progression - under the expert management of Dr. Fleeta  11. Pseudophakia OS  - s/p CE/IOL OS (Dr. Fleeta, 07.22.25)  - IOL in good position, doing well  - s/p YAG cap OS (08.04.25)  - monitor  Ophthalmic Meds Ordered this visit:  Meds ordered this encounter  Medications   prednisoLONE   acetate (PRED FORTE ) 1 % ophthalmic suspension    Sig: Place 1 drop into the right eye 4 (four) times daily for 7 days.    Dispense:  10 mL    Refill:  0     Return in about 1 week (around 06/25/2024) for HST OD, s/p retinopexy OD, DFE, OCT.  There are no Patient Instructions on file for this visit.  This document serves as a record of services personally performed by Redell JUDITHANN Hans, MD, PhD. It was created on their behalf by Almetta Pesa, an ophthalmic technician. The creation of this record is the provider's dictation and/or activities during the visit.    Electronically signed by: Almetta Pesa, OA, 06/18/24  5:33 PM  Redell JUDITHANN Hans, M.D., Ph.D. Diseases & Surgery of the Retina and Vitreous [facility]  I have reviewed the above documentation for accuracy and completeness, and I agree with the above. Redell JUDITHANN Hans, M.D., Ph.D. 06/18/24 5:39 PM   Abbreviations: M myopia (nearsighted); A astigmatism; H hyperopia (farsighted); P presbyopia; Mrx spectacle prescription;  CTL contact lenses; OD right eye; OS left eye; OU both eyes  XT exotropia; ET esotropia; PEK punctate epithelial keratitis; PEE punctate epithelial erosions; DES dry eye syndrome; MGD meibomian gland dysfunction; ATs artificial tears; PFAT's preservative free artificial tears; NSC nuclear sclerotic cataract; PSC posterior subcapsular cataract; ERM epi-retinal membrane; PVD posterior vitreous detachment; RD retinal detachment; DM diabetes mellitus; DR diabetic retinopathy; NPDR non-proliferative diabetic retinopathy; PDR proliferative diabetic retinopathy; CSME clinically significant macular edema; DME diabetic macular edema; dbh dot blot hemorrhages; CWS cotton wool spot; POAG primary open angle glaucoma; C/D cup-to-disc ratio; HVF humphrey visual field; GVF goldmann visual field; OCT optical coherence tomography; IOP intraocular pressure; BRVO Branch retinal vein occlusion; CRVO central retinal  vein occlusion; CRAO central retinal artery occlusion; BRAO branch retinal artery occlusion; RT retinal tear; SB scleral buckle; PPV pars plana vitrectomy; VH Vitreous hemorrhage; PRP panretinal laser photocoagulation; IVK intravitreal kenalog ; VMT vitreomacular traction; MH Macular hole;  NVD neovascularization of the disc; NVE neovascularization elsewhere; AREDS age related eye disease study; ARMD age related macular degeneration; POAG primary open angle glaucoma; EBMD epithelial/anterior basement membrane dystrophy; ACIOL anterior chamber intraocular lens; IOL intraocular lens; PCIOL posterior chamber intraocular lens; Phaco/IOL phacoemulsification with intraocular lens placement; PRK photorefractive keratectomy; LASIK laser assisted in situ keratomileusis; HTN hypertension; DM diabetes mellitus; COPD chronic obstructive pulmonary disease

## 2024-06-19 NOTE — Progress Notes (Signed)
 [facility] - Clinic Note  06/24/2024   CHIEF COMPLAINT Patient presents for Retina Follow Up  HISTORY OF PRESENT ILLNESS: Kimberly Richards is a 48 y.o. female who presents to the clinic today for:  HPI     Retina Follow Up   Patient presents with  Other.  In right eye.  This started 1 week ago.  I, the attending physician,  performed the HPI with the patient and updated documentation appropriately.        Comments   Patient here for 1 week retina follow up for HST OD s/p OS retinopexy. Patient states vision about the same. Sees kind of snow and a big blobby floater OD. OS is fine.       Last edited by Valdemar Rogue, MD on 06/29/2024  4:07 PM.    Pt states vision is about the same. VA in OD is still hazy. Has been working but she reports sleeping upright.   Referring physician: Frazier, Chad, OD [address] [place],  [place] [address]  HISTORICAL INFORMATION:  Selected notes from the MEDICAL RECORD NUMBER Referred by Dr. Vivian for concern of RD OS LEE:  Ocular Hx- PMH-   CURRENT MEDICATIONS: No current outpatient medications on file. (Ophthalmic Drugs)   No current facility-administered medications for this visit. (Ophthalmic Drugs)   Current Outpatient Medications (Other)  Medication Sig   Azelaic Acid  15 % gel APPLY TOPICALLY TO AFFECTED AREA TWICE DAILY IN THE MORNING AND EVENING AFTER SKIN HAS BEEN THOROUGHLY WASHED AND PATTED DRY   clonazePAM  (KLONOPIN ) 0.5 MG tablet Take 0.5-1 tablets (0.25-0.5 mg total) by mouth 2 (two) times daily as needed for anxiety.   fluconazole  (DIFLUCAN ) 150 MG tablet Take 1 tab, repeat in 72 hours if no improvement.   FLUoxetine  (PROZAC ) 10 MG capsule Take 1 capsule (10 mg total) by mouth daily.   FLUoxetine  (PROZAC ) 10 MG tablet Take 1 tablet (10 mg total) by mouth daily.   MAGNESIUM GLYCINATE PO Take 1 tablet by mouth at bedtime.   Multiple Vitamin (MULTIVITAMIN) tablet Take 1 tablet by mouth  daily.   Probiotic Product (FORTIFY PROBIOTIC WOMENS EX ST PO) Take 1 tablet by mouth daily.   spironolactone  (ALDACTONE ) 50 MG tablet Take 1 tablet by mouth once daily   tretinoin  (RETIN-A ) 0.025 % cream Apply topically at bedtime.   vitamin E 180 MG (400 UNITS) capsule Take 400 Units by mouth 2 (two) times daily.   No current facility-administered medications for this visit. (Other)   REVIEW OF SYSTEMS: ROS   Positive for: Neurological, Skin, Eyes Negative for: Constitutional, Gastrointestinal, Genitourinary, Musculoskeletal, HENT, Endocrine, Cardiovascular, Respiratory, Psychiatric, Allergic/Imm, Heme/Lymph Last edited by Orval Asberry RAMAN, COA on 06/24/2024  8:24 AM.         ALLERGIES Allergies  Allergen Reactions   Stadol [Butorphanol]     Hallucinations    Sulfa  Antibiotics     Other reaction(s): gi distress   PAST MEDICAL HISTORY Past Medical History:  Diagnosis Date   Abnormal Pap smear of cervix    Acne    On Aldactone    Anemia    Anxiety with depression 12/18/2017   Cancer (HCC)    Constipation    H/O ovarian cancer    High cholesterol    Hot flashes    HPV (human papilloma virus) anogenital infection    Other hyperlipidemia 07/03/2022   Ovarian cyst    PONV (postoperative nausea and vomiting)    Primary hyperparathyroidism  Past Surgical History:  Procedure Laterality Date   BREAST BIOPSY Right    CATARACT EXTRACTION Left 02/27/2024   Dr. Fleeta   CESAREAN SECTION     x3   EYE SURGERY Right    office procedure of the right eye   GAS INSERTION Left 05/16/2023   Procedure: INSERTION OF GAS;  Surgeon: Valdemar Rogue, MD;  Location: [facility] OR;  Service: Ophthalmology;  Laterality: Left;   LAPAROSCOPIC HYSTERECTOMY     total   LAPAROTOMY     Ovarian mass removed  12/06/2011   Borderline Ovarian Cancer   PARS PLANA VITRECTOMY Left 05/16/2023   Procedure: PARS PLANA VITRECTOMY WITH 25 GAUGE;  Surgeon: Valdemar Rogue, MD;  Location: [facility] OR;  Service:  Ophthalmology;  Laterality: Left;   PARS PLANA VITRECTOMY 27 GAUGE WITH 25 GAUGE PORT Left 11/01/2023   Procedure: PARS PLANA VITRECTOMY FOR MACULAR HOLE WITH LASER AND INTRAOCULAR TAMPONADE; INSERTION OF GAS; MEMBRANE PEEL;  Surgeon: Valdemar Rogue, MD;  Location: [facility] OR;  Service: Ophthalmology;  Laterality: Left;   PHOTOCOAGULATION WITH LASER Left 05/16/2023   Procedure: PHOTOCOAGULATION WITH LASER;  Surgeon: Valdemar Rogue, MD;  Location: [facility] OR;  Service: Ophthalmology;  Laterality: Left;   SMALL INTESTINE SURGERY     YAG LASER APPLICATION Left 03/05/2024   Dr. Fleeta   FAMILY HISTORY Family History  Problem Relation Age of Onset   Hypertension Mother    Diabetes Mother    High blood pressure Mother    Depression Mother    Drug abuse Mother    Obesity Mother    Hypertension Father    Diabetes Father    High Cholesterol Father    Heart disease Father    Cancer Father    Drug abuse Father    Hypertension Maternal Grandmother    Diabetes Maternal Grandmother    Hypertension Maternal Grandfather    Diabetes Maternal Grandfather    Breast cancer Paternal Grandmother    Cancer Paternal Grandmother        breast   Hypertension Paternal Grandmother    Diabetes Paternal Grandmother    Hypertension Paternal Grandfather    Diabetes Paternal Grandfather    Rectal cancer Other    Stomach cancer Other    Esophageal cancer Other    Colon polyps Other    Colon cancer Other    SOCIAL HISTORY Social History   Tobacco Use   Smoking status: Never   Smokeless tobacco: Never  Vaping Use   Vaping status: Never Used  Substance Use Topics   Alcohol use: Yes    Comment: cocktails 3 x week   Drug use: No       OPHTHALMIC EXAM:  Base Eye Exam     Visual Acuity (Snellen - Linear)       Right Left   Dist cc 20/40 20/30 -2   Dist ph cc NI 20/30 +1    Correction: Glasses         Tonometry (Tonopen, 8:19 AM)       Right Left   Pressure 16 17         Pupils       Dark  Light Shape React APD   Right 3 2 Round Brisk None   Left 4 4 Round NR None         Visual Fields (Counting fingers)       Left Right    Full Full         Extraocular Movement  Right Left    Full, Ortho Full, Ortho         Neuro/Psych     Oriented x3: Yes   Mood/Affect: Normal         Dilation     Both eyes: 1.0% Mydriacyl , 2.5% Phenylephrine  @ 8:19 AM           Slit Lamp and Fundus Exam     Slit Lamp Exam       Right Left   Lids/Lashes Normal Normal   Conjunctiva/Sclera mild melanosis mild melanosis, sutures dissolved   Cornea arcus, trace tear film debris, +sub conj heme trace PEE, trace tear film debris, well healed cataract wound, superior and inferior LRI's   Anterior Chamber deep, clear, narrow temporal angle Deep, 1+fine cell and pigment   Iris Round and reactive Round and dilated   Lens 1+ Cortical cataract PC IOL in good position with open PC, fine pigment on optic   Anterior Vitreous mild syneresis, +pigment/RBC, Posterior vitreous detachment, +blood stained vit condensations settling inferiorly post vitrectomy, +fine pigment         Fundus Exam       Right Left   Disc Pink and Sharp, mild PPP mild PPP, mild Pallor, Sharp rim   C/D Ratio 0.6 0.5   Macula Hazy view, gross flat--no details visible Flat, good foveal reflex, mac hole closed, No heme or edema, focal CR atrophy ST mac   Vessels attenuated, mild tortuosity attenuated, mild tortuosity   Periphery Attached, patches of lattice degeneration at 1100--new operculated tear within bed of lattice-good laser surrounding and from 0430-0600, smaller patches of lattice at 0300 and 0730 equator -- good laser changes surrounding all lesions, retinal hole at 1200, HST at 200 w/ surrounding heme--good early changes surrounding both lesions, not fully mature Attached, good laser surrounding large tears SN quad at 1000 and 1100, good 360 peripheral laser, focal fibrosis; PRE-OP: 2 large tears SN  quad at 1000 and 1100 with +SRF; no other details visible elsewhere           Refraction     Wearing Rx       Sphere Cylinder Axis Add   Right -6.50 +4.75 094 +2.00   Left -6.25 +4.00 091 +2.00           IMAGING AND PROCEDURES  Imaging and Procedures for 06/24/2024  OCT, Retina - OU - Both Eyes       Right Eye Quality was good. Central Foveal Thickness: 278. Progression has been stable. Findings include normal foveal contour, no IRF, no SRF (Stable release of VMA to full PVD and persistent vit opacities ).   Left Eye Quality was good. Central Foveal Thickness: 263. Progression has been stable. Findings include normal foveal contour, no IRF, no SRF, outer retinal atrophy (Mac hole closed, stable improvement in ellipsoid signal, focal atrophy ST mac).   Notes *Images captured and stored on drive  Diagnosis / Impression:  OD: NFP, no IRF/SRF--Stable release of VMA to full PVD and persistent vit opacities   OS: Mac hole closed, interval improvement in ellipsoid signal, focal atrophy ST mac  Clinical management:  See below  Abbreviations: NFP - Normal foveal profile. CME - cystoid macular edema. PED - pigment epithelial detachment. IRF - intraretinal fluid. SRF - subretinal fluid. EZ - ellipsoid zone. ERM - epiretinal membrane. ORA - outer retinal atrophy. ORT - outer retinal tubulation. SRHM - subretinal hyper-reflective material. IRHM - intraretinal hyper-reflective material  ASSESSMENT/PLAN:   ICD-10-CM   1. Full thickness macular hole of left eye  H35.342 OCT, Retina - OU - Both Eyes    2. Left retinal detachment  H33.22     3. Cystoid macular edema of left eye  H35.352     4. Vitreous hemorrhage of left eye (HCC)  H43.12     5. Lattice degeneration of right retina  H35.411     6. Posterior vitreous detachment of right eye  H43.811     7. Retinal tear of right eye  H33.311     8. Retinal hole of right eye  H33.321     9. Vitreous  hemorrhage of right eye (HCC)  H43.11     10. Cortical cataract of right eye  H26.9     11. Pseudophakia  Z96.1      Macular hole, left eye   - OCT showed new full-thickness macular hole OS on 03.24.25 visit - s/p PPV/TissueBlue /MP/14% C3F8 OS, 03.27.25             - doing well             - mac hole closed; retina attached  - BCVA OS 20/30--stable             - IOP 16 today    2-4. Rhegmatogenous retinal detachment with vitreous hemorrhage and CME OS  - s/p STK OS #1 (01.13.25)  - pt reports progressive vision loss OS w/ onset on Saturday, 10.05.24, green blobs in vision  - denies photopsias  - vision became increasing blurry and presented to ED on Sunday, 10.06.24  - outpt follow up with Dr. Frazier on 10.07.24 - exam 10.8.24 w/ diffuse VH (no view of posterior pole), two large retinal tears at 1000 and 1100 (superonasal periphery) with +SRF - B-scan 10.8.24 ultrasound shows focal peripheral detachment in superonasal quad with tears at 1000 and 1100, matching exam - s/p PPV/PFO/EL/FAX/14% C3F8 OS, 10.09.2024             - retina attached and in good position -- good laser around breaks and 360  - IOP OS 16         5. Lattice degeneration w/ atrophic holes OD - patches of lattice at 11 and from 0430-0600, smaller patches of lattice at 0300 and 0730 equator - s/p retinopexy OD (10.08.24) -- good laser surrounding all lesions - new retinal hole and RT OD w/ acute PVD on 11.12.25 -- see below - monitor  6-9. Acute PVD w/ retinal hole, retinal tear and VH OD  - Pt presented acutely on 11.12.25 for development of new floater and hazy vision OD, no FOL  - BCVA 20/40--+vit opacities  - s/p Laser retinopexy OD 11.12.25  - OCT OD Stable release of VMA to full PVD and persistent vit opacities    - exam shows +PVD, +blood stained vit condensations settling inferiorly, ret hole at 1200 and HST w/ surrounding heme at 0200--good early laser changes surrounding both lesions  -- VH  precautions reviewed -- minimize activities, keep head elevated, avoid ASA/NSAIDs/blood thinners as able - f/u 1 week, DFE, OCT     10 . Cortical Cataract OD - The symptoms of cataract, surgical options, and treatments and risks were discussed with patient. - discussed diagnosis and progression - under the expert management of Dr. Fleeta  11. Pseudophakia OS  - s/p CE/IOL OS (Dr. Fleeta, 07.22.25)  - IOL in good position, doing well  - s/p YAG cap OS (08.04.25)  -  monitor  Ophthalmic Meds Ordered this visit:  No orders of the defined types were placed in this encounter.    Return in about 1 week (around 07/01/2024) for HST/VH OD, DFE, OCT.  There are no Patient Instructions on file for this visit.  This document serves as a record of services personally performed by Redell JUDITHANN Hans, MD, PhD. It was created on their behalf by Wanda GEANNIE Keens, COT an ophthalmic technician. The creation of this record is the provider's dictation and/or activities during the visit.    Electronically signed by:  Wanda GEANNIE Keens, COT  06/29/24 4:09 PM  This document serves as a record of services personally performed by Redell JUDITHANN Hans, MD, PhD. It was created on their behalf by Almetta Pesa, an ophthalmic technician. The creation of this record is the provider's dictation and/or activities during the visit.    Electronically signed by: Almetta Pesa, OA, 06/29/24  4:09 PM   Redell JUDITHANN Hans, M.D., Ph.D. Diseases & Surgery of the Retina and Vitreous [facility]  I have reviewed the above documentation for accuracy and completeness, and I agree with the above. Redell JUDITHANN Hans, M.D., Ph.D. 06/29/24 4:10 PM   Abbreviations: M myopia (nearsighted); A astigmatism; H hyperopia (farsighted); P presbyopia; Mrx spectacle prescription;  CTL contact lenses; OD right eye; OS left eye; OU both eyes  XT exotropia; ET esotropia; PEK punctate epithelial keratitis; PEE punctate  epithelial erosions; DES dry eye syndrome; MGD meibomian gland dysfunction; ATs artificial tears; PFAT's preservative free artificial tears; NSC nuclear sclerotic cataract; PSC posterior subcapsular cataract; ERM epi-retinal membrane; PVD posterior vitreous detachment; RD retinal detachment; DM diabetes mellitus; DR diabetic retinopathy; NPDR non-proliferative diabetic retinopathy; PDR proliferative diabetic retinopathy; CSME clinically significant macular edema; DME diabetic macular edema; dbh dot blot hemorrhages; CWS cotton wool spot; POAG primary open angle glaucoma; C/D cup-to-disc ratio; HVF humphrey visual field; GVF goldmann visual field; OCT optical coherence tomography; IOP intraocular pressure; BRVO Branch retinal vein occlusion; CRVO central retinal vein occlusion; CRAO central retinal artery occlusion; BRAO branch retinal artery occlusion; RT retinal tear; SB scleral buckle; PPV pars plana vitrectomy; VH Vitreous hemorrhage; PRP panretinal laser photocoagulation; IVK intravitreal kenalog ; VMT vitreomacular traction; MH Macular hole;  NVD neovascularization of the disc; NVE neovascularization elsewhere; AREDS age related eye disease study; ARMD age related macular degeneration; POAG primary open angle glaucoma; EBMD epithelial/anterior basement membrane dystrophy; ACIOL anterior chamber intraocular lens; IOL intraocular lens; PCIOL posterior chamber intraocular lens; Phaco/IOL phacoemulsification with intraocular lens placement; PRK photorefractive keratectomy; LASIK laser assisted in situ keratomileusis; HTN hypertension; DM diabetes mellitus; COPD chronic obstructive pulmonary disease

## 2024-06-20 ENCOUNTER — Other Ambulatory Visit: Payer: Self-pay | Admitting: Family Medicine

## 2024-06-20 ENCOUNTER — Other Ambulatory Visit: Payer: Self-pay

## 2024-06-20 ENCOUNTER — Telehealth: Payer: Self-pay | Admitting: *Deleted

## 2024-06-20 DIAGNOSIS — E7800 Pure hypercholesterolemia, unspecified: Secondary | ICD-10-CM

## 2024-06-20 MED ORDER — FLUOXETINE HCL 10 MG PO CAPS
10.0000 mg | ORAL_CAPSULE | Freq: Every day | ORAL | 3 refills | Status: AC
Start: 2024-06-20 — End: ?

## 2024-06-20 NOTE — Telephone Encounter (Signed)
 Copied from CRM [id number]. Topic: Clinical - Prescription Issue >> Jun 19, 2024  4:55 PM Tinnie C wrote: Reason for CRM: Lavetta from [facility] calling to let us  know that pts insurance will only cover fluoxetine  2mg  capsules or escitalopram  if tablets are needed.  [facility] [address] [place], [place] - [address] [place] [address] Phone: [phone] Fax: [phone]

## 2024-06-20 NOTE — Telephone Encounter (Signed)
 OK to change to capsules. Thx.

## 2024-06-20 NOTE — Telephone Encounter (Signed)
 fluoxetine  2mg  capsules  has been sent.

## 2024-06-24 ENCOUNTER — Ambulatory Visit (INDEPENDENT_AMBULATORY_CARE_PROVIDER_SITE_OTHER): Admitting: Ophthalmology

## 2024-06-24 ENCOUNTER — Encounter (INDEPENDENT_AMBULATORY_CARE_PROVIDER_SITE_OTHER): Payer: Self-pay | Admitting: Ophthalmology

## 2024-06-24 DIAGNOSIS — H43811 Vitreous degeneration, right eye: Secondary | ICD-10-CM

## 2024-06-24 DIAGNOSIS — H35411 Lattice degeneration of retina, right eye: Secondary | ICD-10-CM

## 2024-06-24 DIAGNOSIS — H3322 Serous retinal detachment, left eye: Secondary | ICD-10-CM | POA: Diagnosis not present

## 2024-06-24 DIAGNOSIS — H4311 Vitreous hemorrhage, right eye: Secondary | ICD-10-CM

## 2024-06-24 DIAGNOSIS — H4312 Vitreous hemorrhage, left eye: Secondary | ICD-10-CM

## 2024-06-24 DIAGNOSIS — H33321 Round hole, right eye: Secondary | ICD-10-CM

## 2024-06-24 DIAGNOSIS — H4313 Vitreous hemorrhage, bilateral: Secondary | ICD-10-CM | POA: Diagnosis not present

## 2024-06-24 DIAGNOSIS — Z961 Presence of intraocular lens: Secondary | ICD-10-CM

## 2024-06-24 DIAGNOSIS — H35352 Cystoid macular degeneration, left eye: Secondary | ICD-10-CM

## 2024-06-24 DIAGNOSIS — H269 Unspecified cataract: Secondary | ICD-10-CM

## 2024-06-24 DIAGNOSIS — H35342 Macular cyst, hole, or pseudohole, left eye: Secondary | ICD-10-CM

## 2024-06-24 DIAGNOSIS — H33311 Horseshoe tear of retina without detachment, right eye: Secondary | ICD-10-CM

## 2024-06-25 ENCOUNTER — Encounter (INDEPENDENT_AMBULATORY_CARE_PROVIDER_SITE_OTHER): Payer: Self-pay | Admitting: Ophthalmology

## 2024-06-27 ENCOUNTER — Encounter (INDEPENDENT_AMBULATORY_CARE_PROVIDER_SITE_OTHER): Payer: Self-pay | Admitting: Ophthalmology

## 2024-06-27 ENCOUNTER — Ambulatory Visit (INDEPENDENT_AMBULATORY_CARE_PROVIDER_SITE_OTHER): Admitting: Ophthalmology

## 2024-06-27 ENCOUNTER — Ambulatory Visit: Payer: Self-pay | Admitting: Family Medicine

## 2024-06-27 ENCOUNTER — Ambulatory Visit (HOSPITAL_BASED_OUTPATIENT_CLINIC_OR_DEPARTMENT_OTHER)
Admission: RE | Admit: 2024-06-27 | Discharge: 2024-06-27 | Disposition: A | Discharge status: Home / Self Care | Payer: Self-pay | Source: Ambulatory Visit | Attending: Family Medicine | Admitting: Family Medicine

## 2024-06-27 DIAGNOSIS — H35352 Cystoid macular degeneration, left eye: Secondary | ICD-10-CM

## 2024-06-27 DIAGNOSIS — H43811 Vitreous degeneration, right eye: Secondary | ICD-10-CM

## 2024-06-27 DIAGNOSIS — E7800 Pure hypercholesterolemia, unspecified: Secondary | ICD-10-CM | POA: Insufficient documentation

## 2024-06-27 DIAGNOSIS — Z961 Presence of intraocular lens: Secondary | ICD-10-CM

## 2024-06-27 DIAGNOSIS — H4312 Vitreous hemorrhage, left eye: Secondary | ICD-10-CM

## 2024-06-27 DIAGNOSIS — H35411 Lattice degeneration of retina, right eye: Secondary | ICD-10-CM

## 2024-06-27 DIAGNOSIS — H33321 Round hole, right eye: Secondary | ICD-10-CM

## 2024-06-27 DIAGNOSIS — H35342 Macular cyst, hole, or pseudohole, left eye: Secondary | ICD-10-CM

## 2024-06-27 DIAGNOSIS — H33311 Horseshoe tear of retina without detachment, right eye: Secondary | ICD-10-CM

## 2024-06-27 DIAGNOSIS — H3322 Serous retinal detachment, left eye: Secondary | ICD-10-CM

## 2024-06-27 DIAGNOSIS — H269 Unspecified cataract: Secondary | ICD-10-CM

## 2024-06-27 DIAGNOSIS — H4311 Vitreous hemorrhage, right eye: Secondary | ICD-10-CM

## 2024-06-27 NOTE — Progress Notes (Incomplete)
 [facility] - Clinic Note  06/27/2024   CHIEF COMPLAINT Patient presents for Retina Follow Up  HISTORY OF PRESENT ILLNESS: Kimberly Richards is a 48 y.o. female who presents to the clinic today for:  HPI     Retina Follow Up   In left eye.  This started 4 hours ago.  Severity is moderate.        Comments   Pt states when she woke up vision was hazy but has progressively been getting worse. Pt is having halos around lights. Pt states pain is about 6-7/10. Pt has been putting in Systane drops and tried B&L eye wash but there is no relief. Pt states she is more sensitive to light.       Last edited by Elnor Avelina RAMAN, COT on 06/27/2024 10:14 AM.      Pt states vision has developed haze in OS this morning, seeing rainbow halos around lights.    Referring physician: Frazier, Chad, OD [address] [place],  [place] [address]  HISTORICAL INFORMATION:  Selected notes from the MEDICAL RECORD NUMBER Referred by Dr. Vivian for concern of RD OS LEE:  Ocular Hx- PMH-   CURRENT MEDICATIONS: No current outpatient medications on file. (Ophthalmic Drugs)   No current facility-administered medications for this visit. (Ophthalmic Drugs)   Current Outpatient Medications (Other)  Medication Sig   Azelaic Acid  15 % gel APPLY TOPICALLY TO AFFECTED AREA TWICE DAILY IN THE MORNING AND EVENING AFTER SKIN HAS BEEN THOROUGHLY WASHED AND PATTED DRY   clonazePAM  (KLONOPIN ) 0.5 MG tablet Take 0.5-1 tablets (0.25-0.5 mg total) by mouth 2 (two) times daily as needed for anxiety.   fluconazole  (DIFLUCAN ) 150 MG tablet Take 1 tab, repeat in 72 hours if no improvement.   FLUoxetine  (PROZAC ) 10 MG capsule Take 1 capsule (10 mg total) by mouth daily.   FLUoxetine  (PROZAC ) 10 MG tablet Take 1 tablet (10 mg total) by mouth daily.   MAGNESIUM GLYCINATE PO Take 1 tablet by mouth at bedtime.   Multiple Vitamin (MULTIVITAMIN) tablet Take 1 tablet by mouth daily.   Probiotic  Product (FORTIFY PROBIOTIC WOMENS EX ST PO) Take 1 tablet by mouth daily.   spironolactone  (ALDACTONE ) 50 MG tablet Take 1 tablet by mouth once daily   tretinoin  (RETIN-A ) 0.025 % cream Apply topically at bedtime.   vitamin E 180 MG (400 UNITS) capsule Take 400 Units by mouth 2 (two) times daily.   No current facility-administered medications for this visit. (Other)   REVIEW OF SYSTEMS: ROS   Positive for: Neurological, Skin, Eyes Negative for: Constitutional, Gastrointestinal, Genitourinary, Musculoskeletal, HENT, Endocrine, Cardiovascular, Respiratory, Psychiatric, Allergic/Imm, Heme/Lymph Last edited by Elnor Avelina RAMAN, COT on 06/27/2024 10:14 AM.         ALLERGIES Allergies  Allergen Reactions   Stadol [Butorphanol]     Hallucinations    Sulfa  Antibiotics     Other reaction(s): gi distress   PAST MEDICAL HISTORY Past Medical History:  Diagnosis Date   Abnormal Pap smear of cervix    Acne    On Aldactone    Anemia    Anxiety with depression 12/18/2017   Cancer (HCC)    Constipation    H/O ovarian cancer    High cholesterol    Hot flashes    HPV (human papilloma virus) anogenital infection    Other hyperlipidemia 07/03/2022   Ovarian cyst    PONV (postoperative nausea and vomiting)    Primary hyperparathyroidism  Past Surgical History:  Procedure Laterality Date   BREAST BIOPSY Right    CATARACT EXTRACTION Left 02/27/2024   Dr. Fleeta   CESAREAN SECTION     x3   EYE SURGERY Right    office procedure of the right eye   GAS INSERTION Left 05/16/2023   Procedure: INSERTION OF GAS;  Surgeon: Valdemar Rogue, MD;  Location: [facility] OR;  Service: Ophthalmology;  Laterality: Left;   LAPAROSCOPIC HYSTERECTOMY     total   LAPAROTOMY     Ovarian mass removed  12/06/2011   Borderline Ovarian Cancer   PARS PLANA VITRECTOMY Left 05/16/2023   Procedure: PARS PLANA VITRECTOMY WITH 25 GAUGE;  Surgeon: Valdemar Rogue, MD;  Location: [facility] OR;  Service: Ophthalmology;  Laterality:  Left;   PARS PLANA VITRECTOMY 27 GAUGE WITH 25 GAUGE PORT Left 11/01/2023   Procedure: PARS PLANA VITRECTOMY FOR MACULAR HOLE WITH LASER AND INTRAOCULAR TAMPONADE; INSERTION OF GAS; MEMBRANE PEEL;  Surgeon: Valdemar Rogue, MD;  Location: [facility] OR;  Service: Ophthalmology;  Laterality: Left;   PHOTOCOAGULATION WITH LASER Left 05/16/2023   Procedure: PHOTOCOAGULATION WITH LASER;  Surgeon: Valdemar Rogue, MD;  Location: [facility] OR;  Service: Ophthalmology;  Laterality: Left;   SMALL INTESTINE SURGERY     YAG LASER APPLICATION Left 03/05/2024   Dr. Fleeta   FAMILY HISTORY Family History  Problem Relation Age of Onset   Hypertension Mother    Diabetes Mother    High blood pressure Mother    Depression Mother    Drug abuse Mother    Obesity Mother    Hypertension Father    Diabetes Father    High Cholesterol Father    Heart disease Father    Cancer Father    Drug abuse Father    Hypertension Maternal Grandmother    Diabetes Maternal Grandmother    Hypertension Maternal Grandfather    Diabetes Maternal Grandfather    Breast cancer Paternal Grandmother    Cancer Paternal Grandmother        breast   Hypertension Paternal Grandmother    Diabetes Paternal Grandmother    Hypertension Paternal Grandfather    Diabetes Paternal Grandfather    Rectal cancer Other    Stomach cancer Other    Esophageal cancer Other    Colon polyps Other    Colon cancer Other    SOCIAL HISTORY Social History   Tobacco Use   Smoking status: Never   Smokeless tobacco: Never  Vaping Use   Vaping status: Never Used  Substance Use Topics   Alcohol use: Yes    Comment: cocktails 3 x week   Drug use: No       OPHTHALMIC EXAM:  Base Eye Exam     Visual Acuity (Snellen - Linear)       Right Left   Dist cc 20/25 -1 20/40 -1   Dist ph cc NI 20/40 +2         Tonometry (Tonopen, 10:19 AM)       Right Left   Pressure 17 15         Pupils       Pupils Dark Light Shape React APD   Right PERRL 3 2  Round Brisk NR   Left PERRL 5 5 Irregular Minimal None         Visual Fields       Left Right    Full Full         Extraocular Movement  Right Left    Full, Ortho Full, Ortho         Neuro/Psych     Oriented x3: Yes   Mood/Affect: Normal         Dilation     Both eyes: 1.0% Mydriacyl , 2.5% Phenylephrine  @ 10:19 AM           Slit Lamp and Fundus Exam     Slit Lamp Exam       Right Left   Lids/Lashes Normal Normal   Conjunctiva/Sclera mild melanosis mild melanosis, sutures dissolved   Cornea arcus, trace tear film debris, +sub conj heme trace PEE, trace tear film debris, well healed cataract wound, superior and inferior LRI's   Anterior Chamber deep, clear, narrow temporal angle Deep, 1+fine cell and pigment   Iris Round and reactive Round and dilated   Lens 1+ Cortical cataract PC IOL in good position with open PC, fine pigment on optic   Anterior Vitreous mild syneresis, +pigment/RBC, Posterior vitreous detachment, +blood stained vit condensations settling inferiorly post vitrectomy, +fine pigment         Fundus Exam       Right Left   Disc Pink and Sharp, mild PPP mild PPP, mild Pallor, Sharp rim   C/D Ratio 0.6 0.5   Macula Hazy view, gross flat--no details visible Flat, good foveal reflex, mac hole closed, No heme or edema, focal CR atrophy ST mac   Vessels attenuated, mild tortuosity attenuated, mild tortuosity   Periphery Attached, patches of lattice degeneration at 1100--new operculated tear within bed of lattice-good laser surrounding and from 0430-0600, smaller patches of lattice at 0300 and 0730 equator -- good laser changes surrounding all lesions, retinal hole at 1200, HST at 200 w/ surrounding heme--good early changes surrounding both lesions, not fully mature Attached, good laser surrounding large tears SN quad at 1000 and 1100, good 360 peripheral laser, focal fibrosis; PRE-OP: 2 large tears SN quad at 1000 and 1100 with +SRF; no other  details visible elsewhere           Refraction     Wearing Rx       Sphere Cylinder Axis Add   Right -6.50 +4.75 094 +2.00   Left -6.25 +4.00 091 +2.00           IMAGING AND PROCEDURES  Imaging and Procedures for 06/27/2024            ASSESSMENT/PLAN:   ICD-10-CM   1. Full thickness macular hole of left eye  H35.342     2. Left retinal detachment  H33.22     3. Cystoid macular edema of left eye  H35.352     4. Vitreous hemorrhage of left eye (HCC)  H43.12     5. Lattice degeneration of right retina  H35.411     6. Posterior vitreous detachment of right eye  H43.811      **Pt presents acutely for a new haze in left eye starting this am on 11.21.25-seeing rainbow halos around lights**   Macular hole, left eye   - OCT showed new full-thickness macular hole OS on 03.24.25 visit - s/p PPV/TissueBlue /MP/14% C3F8 OS, 03.27.25             - doing well             - mac hole closed; retina attached  - BCVA OS 20/40--decreased to 20/30             - IOP 16 today  2-4. Rhegmatogenous retinal detachment with vitreous hemorrhage and CME OS  - s/p STK OS #1 (01.13.25)  - pt reports progressive vision loss OS w/ onset on Saturday, 10.05.24, green blobs in vision  - denies photopsias  - vision became increasing blurry and presented to ED on Sunday, 10.06.24  - outpt follow up with Dr. Frazier on 10.07.24 - exam 10.8.24 w/ diffuse VH (no view of posterior pole), two large retinal tears at 1000 and 1100 (superonasal periphery) with +SRF - B-scan 10.8.24 ultrasound shows focal peripheral detachment in superonasal quad with tears at 1000 and 1100, matching exam - s/p PPV/PFO/EL/FAX/14% C3F8 OS, 10.09.2024             - retina attached and in good position -- good laser around breaks and 360  - Exam on 11.21.25 shows mild inflammation OS-pt instructed to take PF QID until symptoms resolved  - f/u as scheduled 11.25.25         5. Lattice degeneration w/ atrophic holes  OD - patches of lattice at 11 and from 0430-0600, smaller patches of lattice at 0300 and 0730 equator - s/p retinopexy OD (10.08.24) -- good laser surrounding all lesions -s/p retinopexy OD 11.12.25--see below - monitor  6-9. Acute PVD w/ retinal hole, retinal tear and VH OD  - Pt presented acutely on 11.12.25 for development of new floater and hazy vision OD, no FOL  - BCVA improved to 20/25 from 20/40--+vit opacities  - s/p Laser retinopexy OD 11.12.25--sub conj heme administered  - OCT OD Stable release of VMA to full PVD and interval improvement in vit opacities    - exam shows +PVD, +blood stained vit condensations settling inferiorly, ret hole at 1200 and HST w/ surrounding heme at 0200--good early laser changes surrounding both lesions  -- VH precautions reviewed -- minimize activities, keep head elevated, avoid ASA/NSAIDs/blood thinners as able - f/u 1 week, DFE, OCT     10 . Cortical Cataract OD - The symptoms of cataract, surgical options, and treatments and risks were discussed with patient. - discussed diagnosis and progression - under the expert management of Dr. Fleeta  11. Pseudophakia OS  - s/p CE/IOL OS (Dr. Fleeta, 07.22.25)  - IOL in good position, doing well  - s/p YAG cap OS (08.04.25)  - monitor  Ophthalmic Meds Ordered this visit:  No orders of the defined types were placed in this encounter.    No follow-ups on file.  There are no Patient Instructions on file for this visit.  This document serves as a record of services personally performed by Redell JUDITHANN Hans, MD, PhD. It was created on their behalf by Wanda GEANNIE Keens, COT an ophthalmic technician. The creation of this record is the provider's dictation and/or activities during the visit.    Electronically signed by:  Wanda GEANNIE Keens, COT  06/27/24 11:22 AM  This document serves as a record of services personally performed by Redell JUDITHANN Hans, MD, PhD. It was created on their behalf by Almetta Pesa,  an ophthalmic technician. The creation of this record is the provider's dictation and/or activities during the visit.    Electronically signed by: Almetta Pesa, OA, 06/27/24  11:22 AM   Redell JUDITHANN Hans, M.D., Ph.D. Diseases & Surgery of the Retina and Vitreous [facility]    Abbreviations: M myopia (nearsighted); A astigmatism; H hyperopia (farsighted); P presbyopia; Mrx spectacle prescription;  CTL contact lenses; OD right eye; OS left eye; OU both eyes  XT  exotropia; ET esotropia; PEK punctate epithelial keratitis; PEE punctate epithelial erosions; DES dry eye syndrome; MGD meibomian gland dysfunction; ATs artificial tears; PFAT's preservative free artificial tears; NSC nuclear sclerotic cataract; PSC posterior subcapsular cataract; ERM epi-retinal membrane; PVD posterior vitreous detachment; RD retinal detachment; DM diabetes mellitus; DR diabetic retinopathy; NPDR non-proliferative diabetic retinopathy; PDR proliferative diabetic retinopathy; CSME clinically significant macular edema; DME diabetic macular edema; dbh dot blot hemorrhages; CWS cotton wool spot; POAG primary open angle glaucoma; C/D cup-to-disc ratio; HVF humphrey visual field; GVF goldmann visual field; OCT optical coherence tomography; IOP intraocular pressure; BRVO Branch retinal vein occlusion; CRVO central retinal vein occlusion; CRAO central retinal artery occlusion; BRAO branch retinal artery occlusion; RT retinal tear; SB scleral buckle; PPV pars plana vitrectomy; VH Vitreous hemorrhage; PRP panretinal laser photocoagulation; IVK intravitreal kenalog ; VMT vitreomacular traction; MH Macular hole;  NVD neovascularization of the disc; NVE neovascularization elsewhere; AREDS age related eye disease study; ARMD age related macular degeneration; POAG primary open angle glaucoma; EBMD epithelial/anterior basement membrane dystrophy; ACIOL anterior chamber intraocular lens; IOL intraocular lens; PCIOL  posterior chamber intraocular lens; Phaco/IOL phacoemulsification with intraocular lens placement; PRK photorefractive keratectomy; LASIK laser assisted in situ keratomileusis; HTN hypertension; DM diabetes mellitus; COPD chronic obstructive pulmonary disease

## 2024-06-29 ENCOUNTER — Encounter (INDEPENDENT_AMBULATORY_CARE_PROVIDER_SITE_OTHER): Payer: Self-pay | Admitting: Ophthalmology

## 2024-06-30 ENCOUNTER — Encounter (INDEPENDENT_AMBULATORY_CARE_PROVIDER_SITE_OTHER): Payer: Self-pay | Admitting: Ophthalmology

## 2024-07-01 ENCOUNTER — Encounter (INDEPENDENT_AMBULATORY_CARE_PROVIDER_SITE_OTHER): Payer: Self-pay | Admitting: Ophthalmology

## 2024-07-01 ENCOUNTER — Ambulatory Visit (INDEPENDENT_AMBULATORY_CARE_PROVIDER_SITE_OTHER): Admitting: Ophthalmology

## 2024-07-01 DIAGNOSIS — H269 Unspecified cataract: Secondary | ICD-10-CM

## 2024-07-01 DIAGNOSIS — H43811 Vitreous degeneration, right eye: Secondary | ICD-10-CM | POA: Diagnosis not present

## 2024-07-01 DIAGNOSIS — Z961 Presence of intraocular lens: Secondary | ICD-10-CM

## 2024-07-01 DIAGNOSIS — H35342 Macular cyst, hole, or pseudohole, left eye: Secondary | ICD-10-CM | POA: Diagnosis not present

## 2024-07-01 DIAGNOSIS — H3322 Serous retinal detachment, left eye: Secondary | ICD-10-CM | POA: Diagnosis not present

## 2024-07-01 DIAGNOSIS — H35352 Cystoid macular degeneration, left eye: Secondary | ICD-10-CM

## 2024-07-01 DIAGNOSIS — H4312 Vitreous hemorrhage, left eye: Secondary | ICD-10-CM | POA: Diagnosis not present

## 2024-07-01 DIAGNOSIS — H33311 Horseshoe tear of retina without detachment, right eye: Secondary | ICD-10-CM

## 2024-07-01 DIAGNOSIS — H33321 Round hole, right eye: Secondary | ICD-10-CM

## 2024-07-01 DIAGNOSIS — H35411 Lattice degeneration of retina, right eye: Secondary | ICD-10-CM

## 2024-07-01 DIAGNOSIS — H4311 Vitreous hemorrhage, right eye: Secondary | ICD-10-CM

## 2024-07-01 NOTE — Progress Notes (Signed)
 [facility] - Clinic Note  07/01/2024   CHIEF COMPLAINT Patient presents for Retina Follow Up  HISTORY OF PRESENT ILLNESS: Kimberly Richards is a 48 y.o. female who presents to the clinic today for:  HPI     Retina Follow Up   In right eye.  This started 1 week ago.  Duration of 1 week.  Since onset it is stable.  I, the attending physician,  performed the HPI with the patient and updated documentation appropriately.        Comments   1 week retina follow up VH OD pt is reporting blurred vision looks like snow OD denies any flashes she is using PF OS QID       Last edited by Valdemar Rogue, MD on 07/01/2024  8:35 AM.     Pt states VA in OD is clearing up, still odd symptoms in OS-white and cloudy-rainbow halos around lights.   Referring physician: Frazier, Chad, OD [address] [place],  [place] [address]  HISTORICAL INFORMATION:  Selected notes from the MEDICAL RECORD NUMBER Referred by Dr. Vivian for concern of RD OS LEE:  Ocular Hx- PMH-   CURRENT MEDICATIONS: Current Outpatient Medications (Ophthalmic Drugs)  Medication Sig   prednisoLONE  acetate (PRED FORTE ) 1 % ophthalmic suspension Place 1 drop into the right eye 4 (four) times daily for 7 days.   No current facility-administered medications for this visit. (Ophthalmic Drugs)   Current Outpatient Medications (Other)  Medication Sig   Azelaic Acid  15 % gel APPLY TOPICALLY TO AFFECTED AREA TWICE DAILY IN THE MORNING AND EVENING AFTER SKIN HAS BEEN THOROUGHLY WASHED AND PATTED DRY   clonazePAM  (KLONOPIN ) 0.5 MG tablet Take 0.5-1 tablets (0.25-0.5 mg total) by mouth 2 (two) times daily as needed for anxiety.   fluconazole  (DIFLUCAN ) 150 MG tablet Take 1 tab, repeat in 72 hours if no improvement.   FLUoxetine  (PROZAC ) 10 MG capsule Take 1 capsule (10 mg total) by mouth daily.   MAGNESIUM GLYCINATE PO Take 1 tablet by mouth at bedtime.   Multiple Vitamin (MULTIVITAMIN) tablet Take 1  tablet by mouth daily.   Probiotic Product (FORTIFY PROBIOTIC WOMENS EX ST PO) Take 1 tablet by mouth daily.   spironolactone  (ALDACTONE ) 50 MG tablet Take 1 tablet by mouth once daily   tretinoin  (RETIN-A ) 0.025 % cream Apply topically at bedtime.   vitamin E 180 MG (400 UNITS) capsule Take 400 Units by mouth 2 (two) times daily.   FLUoxetine  (PROZAC ) 10 MG tablet Take 1 tablet (10 mg total) by mouth daily.   No current facility-administered medications for this visit. (Other)   REVIEW OF SYSTEMS: ROS   Positive for: Neurological, Skin, Eyes Negative for: Constitutional, Gastrointestinal, Genitourinary, Musculoskeletal, HENT, Endocrine, Cardiovascular, Respiratory, Psychiatric, Allergic/Imm, Heme/Lymph Last edited by Resa Delon ORN, COT on 07/01/2024  8:00 AM.      ALLERGIES Allergies  Allergen Reactions   Stadol [Butorphanol]     Hallucinations    Sulfa  Antibiotics     Other reaction(s): gi distress   PAST MEDICAL HISTORY Past Medical History:  Diagnosis Date   Abnormal Pap smear of cervix    Acne    On Aldactone    Anemia    Anxiety with depression 12/18/2017   Cancer (HCC)    Constipation    H/O ovarian cancer    High cholesterol    Hot flashes    HPV (human papilloma virus) anogenital infection    Other hyperlipidemia 07/03/2022  Ovarian cyst    PONV (postoperative nausea and vomiting)    Primary hyperparathyroidism    Past Surgical History:  Procedure Laterality Date   BREAST BIOPSY Right    CATARACT EXTRACTION Left 02/27/2024   Dr. Fleeta   CESAREAN SECTION     x3   EYE SURGERY Right    office procedure of the right eye   GAS INSERTION Left 05/16/2023   Procedure: INSERTION OF GAS;  Surgeon: Valdemar Rogue, MD;  Location: [facility] OR;  Service: Ophthalmology;  Laterality: Left;   LAPAROSCOPIC HYSTERECTOMY     total   LAPAROTOMY     Ovarian mass removed  12/06/2011   Borderline Ovarian Cancer   PARS PLANA VITRECTOMY Left 05/16/2023   Procedure: PARS  PLANA VITRECTOMY WITH 25 GAUGE;  Surgeon: Valdemar Rogue, MD;  Location: [facility] OR;  Service: Ophthalmology;  Laterality: Left;   PARS PLANA VITRECTOMY 27 GAUGE WITH 25 GAUGE PORT Left 11/01/2023   Procedure: PARS PLANA VITRECTOMY FOR MACULAR HOLE WITH LASER AND INTRAOCULAR TAMPONADE; INSERTION OF GAS; MEMBRANE PEEL;  Surgeon: Valdemar Rogue, MD;  Location: [facility] OR;  Service: Ophthalmology;  Laterality: Left;   PHOTOCOAGULATION WITH LASER Left 05/16/2023   Procedure: PHOTOCOAGULATION WITH LASER;  Surgeon: Valdemar Rogue, MD;  Location: [facility] OR;  Service: Ophthalmology;  Laterality: Left;   SMALL INTESTINE SURGERY     YAG LASER APPLICATION Left 03/05/2024   Dr. Fleeta   FAMILY HISTORY Family History  Problem Relation Age of Onset   Hypertension Mother    Diabetes Mother    High blood pressure Mother    Depression Mother    Drug abuse Mother    Obesity Mother    Hypertension Father    Diabetes Father    High Cholesterol Father    Heart disease Father    Cancer Father    Drug abuse Father    Hypertension Maternal Grandmother    Diabetes Maternal Grandmother    Hypertension Maternal Grandfather    Diabetes Maternal Grandfather    Breast cancer Paternal Grandmother    Cancer Paternal Grandmother        breast   Hypertension Paternal Grandmother    Diabetes Paternal Grandmother    Hypertension Paternal Grandfather    Diabetes Paternal Grandfather    Rectal cancer Other    Stomach cancer Other    Esophageal cancer Other    Colon polyps Other    Colon cancer Other    SOCIAL HISTORY Social History   Tobacco Use   Smoking status: Never   Smokeless tobacco: Never  Vaping Use   Vaping status: Never Used  Substance Use Topics   Alcohol use: Yes    Comment: cocktails 3 x week   Drug use: No       OPHTHALMIC EXAM:  Base Eye Exam     Visual Acuity (Snellen - Linear)       Right Left   Dist cc 20/25 -2 20/40 -1   Dist ph cc NI 20/30 -2         Tonometry (Tonopen, 8:06 AM)        Right Left   Pressure 18 18         Pupils       Pupils Dark Light Shape React APD   Right PERRL 3 2 Round Brisk None   Left PERRL 5 5 Irregular Minimal          Visual Fields       Left Right  Full Full         Extraocular Movement       Right Left    Full, Ortho Full, Ortho         Neuro/Psych     Oriented x3: Yes   Mood/Affect: Normal         Dilation     Both eyes: 2.5% Phenylephrine  @ 8:06 AM           Slit Lamp and Fundus Exam     Slit Lamp Exam       Right Left   Lids/Lashes Normal Dermatochalasis   Conjunctiva/Sclera mild melanosis, sub conj heme improving 2+ injection, mild melanosis, sutures dissolved   Cornea arcus, trace tear film debris 1-2+ fine PEE, trace Descemet's folds, trace tear film debris, well healed cataract wound, superior and inferior LRI's   Anterior Chamber deep, clear, narrow temporal angle Deep, 1+fine cell and pigment   Iris Round and reactive Round and dilated   Lens 1+ Cortical cataract PC IOL in good position with open PC, fine pigment on optic   Anterior Vitreous mild syneresis, +pigment/RBC--improving, Posterior vitreous detachment, +blood stained vit condensations clearing and settling inferiorly post vitrectomy, +fine pigment         Fundus Exam       Right Left   Disc Pink and Sharp, mild PPP mild PPP, mild Pallor, Sharp rim   C/D Ratio 0.6 0.5   Macula flat, good foveal reflex, mild RPE mottling, no heme or edema Flat, good foveal reflex, mac hole closed, No heme or edema, focal CR atrophy ST mac   Vessels attenuated, mild tortuosity attenuated, mild tortuosity   Periphery Attached, patches of lattice degeneration at 1100--operculated tear within bed of lattice-good laser surrounding and from 0430-0600, smaller patches of lattice at 0300 and 0730 equator -- good laser changes surrounding all lesions, retinal hole at 1200, HST at 200 w/ surrounding heme--good early changes surrounding both lesions, not  fully mature, inferior periphery obscured by heme Attached, good laser surrounding large tears SN quad at 1000 and 1100, good 360 peripheral laser, focal fibrosis; PRE-OP: 2 large tears SN quad at 1000 and 1100 with +SRF; no other details visible elsewhere           Refraction     Wearing Rx       Sphere Cylinder Axis Add   Right -6.50 +4.75 094 +2.00   Left -6.25 +4.00 091 +2.00           IMAGING AND PROCEDURES  Imaging and Procedures for 07/01/2024  OCT, Retina - OU - Both Eyes       Right Eye Quality was good. Central Foveal Thickness: 273. Progression has improved. Findings include normal foveal contour, no IRF, no SRF (Interval improvement in fine vit opacities, prominent vit opacities inferior to disc--seen best on widefield ).   Left Eye Quality was good. Central Foveal Thickness: 268. Progression has been stable. Findings include normal foveal contour, no IRF, no SRF, outer retinal atrophy (Mac hole closed, stable improvement in ellipsoid signal, focal atrophy ST mac).   Notes *Images captured and stored on drive  Diagnosis / Impression:  OD: NFP, no IRF/SRF--Interval improvement in fine vit opacities, prominent vit opacities inferior to disc--seen best on widefield  OS: Mac hole closed, interval improvement in ellipsoid signal, focal atrophy ST mac  Clinical management:  See below  Abbreviations: NFP - Normal foveal profile. CME - cystoid macular edema. PED - pigment epithelial detachment. IRF - intraretinal  fluid. SRF - subretinal fluid. EZ - ellipsoid zone. ERM - epiretinal membrane. ORA - outer retinal atrophy. ORT - outer retinal tubulation. SRHM - subretinal hyper-reflective material. IRHM - intraretinal hyper-reflective material            ASSESSMENT/PLAN:   ICD-10-CM   1. Full thickness macular hole of left eye  H35.342 OCT, Retina - OU - Both Eyes    2. Left retinal detachment  H33.22     3. Cystoid macular edema of left eye  H35.352     4.  Vitreous hemorrhage of left eye (HCC)  H43.12     5. Lattice degeneration of right retina  H35.411     6. Posterior vitreous detachment of right eye  H43.811     7. Retinal tear of right eye  H33.311     8. Retinal hole of right eye  H33.321     9. Vitreous hemorrhage of right eye (HCC)  H43.11     10. Cortical cataract of right eye  H26.9     11. Pseudophakia  Z96.1      Macular hole, left eye   - OCT showed new full-thickness macular hole OS on 03.24.25 visit - s/p PPV/TissueBlue /MP/14% C3F8 OS, 03.27.25             - doing well             - mac hole closed; retina attached  - BCVA OS 20/40--decreased to 20/30             - IOP 18 today    2-4. Rhegmatogenous retinal detachment with vitreous hemorrhage and CME OS  - s/p STK OS #1 (01.13.25)  - pt reports progressive vision loss OS w/ onset on Saturday, 10.05.24, green blobs in vision  - denies photopsias  - vision became increasing blurry and presented to ED on Sunday, 10.06.24  - outpt follow up with Dr. Vivian on 10.07.24 - exam 10.8.24 w/ diffuse VH (no view of posterior pole), two large retinal tears at 1000 and 1100 (superonasal periphery) with +SRF - B-scan 10.8.24 ultrasound shows focal peripheral detachment in superonasal quad with tears at 1000 and 1100, matching exam - s/p PPV/PFO/EL/FAX/14% C3F8 OS, 10.09.2024             - retina attached and in good position -- good laser around breaks and 360  - Exam on 11.21.25 shows mild inflammation OS-pt instructed to take PF QID until symptoms resolved--has stopped.   - f/u as scheduled 11.25.25         5. Lattice degeneration w/ atrophic holes OD - patches of lattice at 11 and from 0430-0600, smaller patches of lattice at 0300 and 0730 equator - s/p retinopexy OD (10.08.24) -- good laser surrounding all lesions - s/p retinopexy OD 11.12.25--see below - monitor  6-9. Acute PVD w/ retinal hole, retinal tear and VH OD  - Pt presented acutely on 11.12.25 for  development of new floater and hazy vision OD, no FOL  - OCT OD Interval improvement in fine vit opacities, prominent vit opacities inferior to disc--seen best on widefield     - BCVA improved to 20/25 from 20/40--+vit opacities  - s/p Laser retinopexy OD 11.12.25--sub conj lido administered  - exam shows +PVD, +blood stained vit condensations settling inferiorly, ret hole at 1200 and HST w/ surrounding heme at 0200--good early laser changes surrounding both lesions -- not yet fully mature  -- VH precautions reviewed -- minimize activities, keep head elevated,  avoid ASA/NSAIDs/blood thinners as able - f/u 2-3 weeks, DFE, OCT     10. Cortical Cataract OD - The symptoms of cataract, surgical options, and treatments and risks were discussed with patient. - discussed diagnosis and progression - under the expert management of Dr. Fleeta  11. Pseudophakia OS  - s/p CE/IOL OS (Dr. Fleeta, 07.22.25)  - IOL in good position, doing well  - s/p YAG cap OS (08.04.25)  - monitor  Ophthalmic Meds Ordered this visit:  No orders of the defined types were placed in this encounter.    Return for 2-3 wks - f/u RT w/ VH OD - Dilated Exam, OCT.  There are no Patient Instructions on file for this visit.  This document serves as a record of services personally performed by Redell JUDITHANN Hans, MD, PhD. It was created on their behalf by Almetta Pesa, an ophthalmic technician. The creation of this record is the provider's dictation and/or activities during the visit.    Electronically signed by: Almetta Pesa, OA, 07/01/24  8:36 AM   Redell JUDITHANN Hans, M.D., Ph.D. Diseases & Surgery of the Retina and Vitreous [facility]  I have reviewed the above documentation for accuracy and completeness, and I agree with the above. Redell JUDITHANN Hans, M.D., Ph.D. 07/01/24 8:37 AM   Abbreviations: M myopia (nearsighted); A astigmatism; H hyperopia (farsighted); P presbyopia; Mrx spectacle  prescription;  CTL contact lenses; OD right eye; OS left eye; OU both eyes  XT exotropia; ET esotropia; PEK punctate epithelial keratitis; PEE punctate epithelial erosions; DES dry eye syndrome; MGD meibomian gland dysfunction; ATs artificial tears; PFAT's preservative free artificial tears; NSC nuclear sclerotic cataract; PSC posterior subcapsular cataract; ERM epi-retinal membrane; PVD posterior vitreous detachment; RD retinal detachment; DM diabetes mellitus; DR diabetic retinopathy; NPDR non-proliferative diabetic retinopathy; PDR proliferative diabetic retinopathy; CSME clinically significant macular edema; DME diabetic macular edema; dbh dot blot hemorrhages; CWS cotton wool spot; POAG primary open angle glaucoma; C/D cup-to-disc ratio; HVF humphrey visual field; GVF goldmann visual field; OCT optical coherence tomography; IOP intraocular pressure; BRVO Branch retinal vein occlusion; CRVO central retinal vein occlusion; CRAO central retinal artery occlusion; BRAO branch retinal artery occlusion; RT retinal tear; SB scleral buckle; PPV pars plana vitrectomy; VH Vitreous hemorrhage; PRP panretinal laser photocoagulation; IVK intravitreal kenalog ; VMT vitreomacular traction; MH Macular hole;  NVD neovascularization of the disc; NVE neovascularization elsewhere; AREDS age related eye disease study; ARMD age related macular degeneration; POAG primary open angle glaucoma; EBMD epithelial/anterior basement membrane dystrophy; ACIOL anterior chamber intraocular lens; IOL intraocular lens; PCIOL posterior chamber intraocular lens; Phaco/IOL phacoemulsification with intraocular lens placement; PRK photorefractive keratectomy; LASIK laser assisted in situ keratomileusis; HTN hypertension; DM diabetes mellitus; COPD chronic obstructive pulmonary disease

## 2024-07-11 NOTE — Progress Notes (Shared)
 [facility] - Clinic Note  07/18/2024   CHIEF COMPLAINT Patient presents for No chief complaint on file.  HISTORY OF PRESENT ILLNESS: Kimberly Richards is a 48 y.o. female who presents to the clinic today for:    Pt states VA in OD is clearing up, still odd symptoms in OS-white and cloudy-rainbow halos around lights.   Referring physician: Frazier, Chad, OD [address] [place],  [place] [address]  HISTORICAL INFORMATION:  Selected notes from the MEDICAL RECORD NUMBER Referred by Dr. Vivian for concern of RD OS LEE:  Ocular Hx- PMH-   CURRENT MEDICATIONS: No current outpatient medications on file. (Ophthalmic Drugs)   No current facility-administered medications for this visit. (Ophthalmic Drugs)   Current Outpatient Medications (Other)  Medication Sig   Azelaic Acid  15 % gel APPLY TOPICALLY TO AFFECTED AREA TWICE DAILY IN THE MORNING AND EVENING AFTER SKIN HAS BEEN THOROUGHLY WASHED AND PATTED DRY   clonazePAM  (KLONOPIN ) 0.5 MG tablet Take 0.5-1 tablets (0.25-0.5 mg total) by mouth 2 (two) times daily as needed for anxiety.   fluconazole  (DIFLUCAN ) 150 MG tablet Take 1 tab, repeat in 72 hours if no improvement.   FLUoxetine  (PROZAC ) 10 MG capsule Take 1 capsule (10 mg total) by mouth daily.   FLUoxetine  (PROZAC ) 10 MG tablet Take 1 tablet (10 mg total) by mouth daily.   MAGNESIUM GLYCINATE PO Take 1 tablet by mouth at bedtime.   Multiple Vitamin (MULTIVITAMIN) tablet Take 1 tablet by mouth daily.   Probiotic Product (FORTIFY PROBIOTIC WOMENS EX ST PO) Take 1 tablet by mouth daily.   spironolactone  (ALDACTONE ) 50 MG tablet Take 1 tablet by mouth once daily   tretinoin  (RETIN-A ) 0.025 % cream Apply topically at bedtime.   vitamin E 180 MG (400 UNITS) capsule Take 400 Units by mouth 2 (two) times daily.   No current facility-administered medications for this visit. (Other)   REVIEW OF SYSTEMS:    ALLERGIES Allergies  Allergen Reactions    Stadol [Butorphanol]     Hallucinations    Sulfa  Antibiotics     Other reaction(s): gi distress   PAST MEDICAL HISTORY Past Medical History:  Diagnosis Date   Abnormal Pap smear of cervix    Acne    On Aldactone    Anemia    Anxiety with depression 12/18/2017   Cancer (HCC)    Constipation    H/O ovarian cancer    High cholesterol    Hot flashes    HPV (human papilloma virus) anogenital infection    Other hyperlipidemia 07/03/2022   Ovarian cyst    PONV (postoperative nausea and vomiting)    Primary hyperparathyroidism    Past Surgical History:  Procedure Laterality Date   BREAST BIOPSY Right    CATARACT EXTRACTION Left 02/27/2024   Dr. Fleeta   CESAREAN SECTION     x3   EYE SURGERY Right    office procedure of the right eye   GAS INSERTION Left 05/16/2023   Procedure: INSERTION OF GAS;  Surgeon: Valdemar Rogue, MD;  Location: [facility] OR;  Service: Ophthalmology;  Laterality: Left;   LAPAROSCOPIC HYSTERECTOMY     total   LAPAROTOMY     Ovarian mass removed  12/06/2011   Borderline Ovarian Cancer   PARS PLANA VITRECTOMY Left 05/16/2023   Procedure: PARS PLANA VITRECTOMY WITH 25 GAUGE;  Surgeon: Valdemar Rogue, MD;  Location: [facility] OR;  Service: Ophthalmology;  Laterality: Left;   PARS PLANA VITRECTOMY 27 GAUGE WITH  25 GAUGE PORT Left 11/01/2023   Procedure: PARS PLANA VITRECTOMY FOR MACULAR HOLE WITH LASER AND INTRAOCULAR TAMPONADE; INSERTION OF GAS; MEMBRANE PEEL;  Surgeon: Valdemar Rogue, MD;  Location: [facility] OR;  Service: Ophthalmology;  Laterality: Left;   PHOTOCOAGULATION WITH LASER Left 05/16/2023   Procedure: PHOTOCOAGULATION WITH LASER;  Surgeon: Valdemar Rogue, MD;  Location: [facility] OR;  Service: Ophthalmology;  Laterality: Left;   SMALL INTESTINE SURGERY     YAG LASER APPLICATION Left 03/05/2024   Dr. Fleeta   FAMILY HISTORY Family History  Problem Relation Age of Onset   Hypertension Mother    Diabetes Mother    High blood pressure Mother    Depression Mother    Drug abuse  Mother    Obesity Mother    Hypertension Father    Diabetes Father    High Cholesterol Father    Heart disease Father    Cancer Father    Drug abuse Father    Hypertension Maternal Grandmother    Diabetes Maternal Grandmother    Hypertension Maternal Grandfather    Diabetes Maternal Grandfather    Breast cancer Paternal Grandmother    Cancer Paternal Grandmother        breast   Hypertension Paternal Grandmother    Diabetes Paternal Grandmother    Hypertension Paternal Grandfather    Diabetes Paternal Grandfather    Rectal cancer Other    Stomach cancer Other    Esophageal cancer Other    Colon polyps Other    Colon cancer Other    SOCIAL HISTORY Social History   Tobacco Use   Smoking status: Never   Smokeless tobacco: Never  Vaping Use   Vaping status: Never Used  Substance Use Topics   Alcohol use: Yes    Comment: cocktails 3 x week   Drug use: No       OPHTHALMIC EXAM:  Not recorded    IMAGING AND PROCEDURES  Imaging and Procedures for 07/18/2024          ASSESSMENT/PLAN:   ICD-10-CM   1. Full thickness macular hole of left eye  H35.342     2. Left retinal detachment  H33.22     3. Cystoid macular edema of left eye  H35.352     4. Vitreous hemorrhage of left eye (HCC)  H43.12     5. Lattice degeneration of right retina  H35.411     6. Posterior vitreous detachment of right eye  H43.811     7. Retinal tear of right eye  H33.311     8. Retinal hole of right eye  H33.321     9. Vitreous hemorrhage of right eye (HCC)  H43.11     10. Cortical cataract of right eye  H26.9     11. Pseudophakia  Z96.1       Macular hole, left eye   - OCT showed new full-thickness macular hole OS on 03.24.25 visit - s/p PPV/TissueBlue /MP/14% C3F8 OS, 03.27.25             - doing well             - mac hole closed; retina attached  - BCVA OS 20/40--decreased to 20/30             - IOP 18 today    2-4. Rhegmatogenous retinal detachment with vitreous  hemorrhage and CME OS  - s/p STK OS #1 (01.13.25)  - pt reports progressive vision loss OS w/ onset on Saturday, 10.05.24, green blobs  in vision  - denies photopsias  - vision became increasing blurry and presented to ED on Sunday, 10.06.24  - outpt follow up with Dr. Frazier on 10.07.24 - exam 10.8.24 w/ diffuse VH (no view of posterior pole), two large retinal tears at 1000 and 1100 (superonasal periphery) with +SRF - B-scan 10.8.24 ultrasound shows focal peripheral detachment in superonasal quad with tears at 1000 and 1100, matching exam - s/p PPV/PFO/EL/FAX/14% C3F8 OS, 10.09.2024             - retina attached and in good position -- good laser around breaks and 360  - Exam on 11.21.25 shows mild inflammation OS-pt instructed to take PF QID until symptoms resolved--has stopped.   - f/u as scheduled 11.25.25         5. Lattice degeneration w/ atrophic holes OD - patches of lattice at 11 and from 0430-0600, smaller patches of lattice at 0300 and 0730 equator - s/p retinopexy OD (10.08.24) -- good laser surrounding all lesions - s/p retinopexy OD 11.12.25--see below - monitor  6-9. Acute PVD w/ retinal hole, retinal tear and VH OD  - Pt presented acutely on 11.12.25 for development of new floater and hazy vision OD, no FOL  - OCT OD Interval improvement in fine vit opacities, prominent vit opacities inferior to disc--seen best on widefield     - BCVA improved to 20/25 from 20/40--+vit opacities  - s/p Laser retinopexy OD 11.12.25--sub conj lido administered  - exam shows +PVD, +blood stained vit condensations settling inferiorly, ret hole at 1200 and HST w/ surrounding heme at 0200--good early laser changes surrounding both lesions -- not yet fully mature  -- VH precautions reviewed -- minimize activities, keep head elevated, avoid ASA/NSAIDs/blood thinners as able - f/u 2-3 weeks, DFE, OCT     10 . Cortical Cataract OD - The symptoms of cataract, surgical options, and treatments and  risks were discussed with patient. - discussed diagnosis and progression - under the expert management of Dr. Fleeta  11. Pseudophakia OS  - s/p CE/IOL OS (Dr. Fleeta, 07.22.25)  - IOL in good position, doing well  - s/p YAG cap OS (08.04.25)  - monitor  Ophthalmic Meds Ordered this visit:  No orders of the defined types were placed in this encounter.    No follow-ups on file.  There are no Patient Instructions on file for this visit.  This document serves as a record of services personally performed by [place] JUDITHANN Hans, MD, PhD. It was created on their behalf by Delon Newness COT, an ophthalmic technician. The creation of this record is the provider's dictation and/or activities during the visit.    Electronically signed by: Delon Newness COT 12.05.25  7:18 AM  This document serves as a record of services personally performed by Redell JUDITHANN Hans, MD, PhD. It was created on their behalf by Wanda GEANNIE Keens, COT an ophthalmic technician. The creation of this record is the provider's dictation and/or activities during the visit.    Electronically signed by:  Wanda GEANNIE Keens, COT  07/18/2024 7:18 AM   Abbreviations: M myopia (nearsighted); A astigmatism; H hyperopia (farsighted); P presbyopia; Mrx spectacle prescription;  CTL contact lenses; OD right eye; OS left eye; OU both eyes  XT exotropia; ET esotropia; PEK punctate epithelial keratitis; PEE punctate epithelial erosions; DES dry eye syndrome; MGD meibomian gland dysfunction; ATs artificial tears; PFAT's preservative free artificial tears; NSC nuclear sclerotic cataract; PSC posterior subcapsular cataract; ERM epi-retinal membrane; PVD posterior vitreous detachment; RD  retinal detachment; DM diabetes mellitus; DR diabetic retinopathy; NPDR non-proliferative diabetic retinopathy; PDR proliferative diabetic retinopathy; CSME clinically significant macular edema; DME diabetic macular edema; dbh dot blot hemorrhages; CWS cotton  wool spot; POAG primary open angle glaucoma; C/D cup-to-disc ratio; HVF humphrey visual field; GVF goldmann visual field; OCT optical coherence tomography; IOP intraocular pressure; BRVO Branch retinal vein occlusion; CRVO central retinal vein occlusion; CRAO central retinal artery occlusion; BRAO branch retinal artery occlusion; RT retinal tear; SB scleral buckle; PPV pars plana vitrectomy; VH Vitreous hemorrhage; PRP panretinal laser photocoagulation; IVK intravitreal kenalog ; VMT vitreomacular traction; MH Macular hole;  NVD neovascularization of the disc; NVE neovascularization elsewhere; AREDS age related eye disease study; ARMD age related macular degeneration; POAG primary open angle glaucoma; EBMD epithelial/anterior basement membrane dystrophy; ACIOL anterior chamber intraocular lens; IOL intraocular lens; PCIOL posterior chamber intraocular lens; Phaco/IOL phacoemulsification with intraocular lens placement; PRK photorefractive keratectomy; LASIK laser assisted in situ keratomileusis; HTN hypertension; DM diabetes mellitus; COPD chronic obstructive pulmonary disease

## 2024-07-18 ENCOUNTER — Encounter (INDEPENDENT_AMBULATORY_CARE_PROVIDER_SITE_OTHER): Admitting: Ophthalmology

## 2024-07-18 DIAGNOSIS — H35352 Cystoid macular degeneration, left eye: Secondary | ICD-10-CM

## 2024-07-18 DIAGNOSIS — Z961 Presence of intraocular lens: Secondary | ICD-10-CM

## 2024-07-18 DIAGNOSIS — H3322 Serous retinal detachment, left eye: Secondary | ICD-10-CM

## 2024-07-18 DIAGNOSIS — H4311 Vitreous hemorrhage, right eye: Secondary | ICD-10-CM

## 2024-07-18 DIAGNOSIS — H269 Unspecified cataract: Secondary | ICD-10-CM

## 2024-07-18 DIAGNOSIS — H4312 Vitreous hemorrhage, left eye: Secondary | ICD-10-CM

## 2024-07-18 DIAGNOSIS — H33311 Horseshoe tear of retina without detachment, right eye: Secondary | ICD-10-CM

## 2024-07-18 DIAGNOSIS — H35411 Lattice degeneration of retina, right eye: Secondary | ICD-10-CM

## 2024-07-18 DIAGNOSIS — H33321 Round hole, right eye: Secondary | ICD-10-CM

## 2024-07-18 DIAGNOSIS — H43811 Vitreous degeneration, right eye: Secondary | ICD-10-CM

## 2024-07-18 DIAGNOSIS — H35342 Macular cyst, hole, or pseudohole, left eye: Secondary | ICD-10-CM

## 2024-09-04 LAB — OPHTHALMOLOGY REPORT-SCANNED

## 2024-10-13 ENCOUNTER — Ambulatory Visit: Admitting: Family Medicine

## 2025-01-06 ENCOUNTER — Encounter (INDEPENDENT_AMBULATORY_CARE_PROVIDER_SITE_OTHER): Admitting: Ophthalmology
